# Patient Record
Sex: Male | Born: 1955 | Race: Black or African American | Hispanic: No | Marital: Single | Smoking: Former smoker
Health system: Southern US, Community
[De-identification: ages and names within clinical notes are randomized; demographics above are authoritative.]

## PROBLEM LIST (undated history)

## (undated) DIAGNOSIS — J301 Allergic rhinitis due to pollen: Secondary | ICD-10-CM

## (undated) DIAGNOSIS — K219 Gastro-esophageal reflux disease without esophagitis: Secondary | ICD-10-CM

## (undated) DIAGNOSIS — M48061 Spinal stenosis, lumbar region without neurogenic claudication: Secondary | ICD-10-CM

## (undated) DIAGNOSIS — L209 Atopic dermatitis, unspecified: Secondary | ICD-10-CM

## (undated) DIAGNOSIS — F40240 Claustrophobia: Secondary | ICD-10-CM

## (undated) DIAGNOSIS — K449 Diaphragmatic hernia without obstruction or gangrene: Secondary | ICD-10-CM

## (undated) DIAGNOSIS — N401 Enlarged prostate with lower urinary tract symptoms: Secondary | ICD-10-CM

## (undated) DIAGNOSIS — Z8042 Family history of malignant neoplasm of prostate: Secondary | ICD-10-CM

## (undated) DIAGNOSIS — Z973 Presence of spectacles and contact lenses: Secondary | ICD-10-CM

## (undated) DIAGNOSIS — M5412 Radiculopathy, cervical region: Secondary | ICD-10-CM

## (undated) DIAGNOSIS — E785 Hyperlipidemia, unspecified: Secondary | ICD-10-CM

## (undated) DIAGNOSIS — T63441A Toxic effect of venom of bees, accidental (unintentional), initial encounter: Secondary | ICD-10-CM

## (undated) DIAGNOSIS — Z6829 Body mass index (BMI) 29.0-29.9, adult: Secondary | ICD-10-CM

## (undated) DIAGNOSIS — M5416 Radiculopathy, lumbar region: Secondary | ICD-10-CM

## (undated) DIAGNOSIS — F419 Anxiety disorder, unspecified: Secondary | ICD-10-CM

## (undated) DIAGNOSIS — F5104 Psychophysiologic insomnia: Secondary | ICD-10-CM

## (undated) DIAGNOSIS — E7800 Pure hypercholesterolemia, unspecified: Secondary | ICD-10-CM

## (undated) DIAGNOSIS — J45909 Unspecified asthma, uncomplicated: Secondary | ICD-10-CM

## (undated) DIAGNOSIS — R202 Paresthesia of skin: Secondary | ICD-10-CM

## (undated) DIAGNOSIS — R030 Elevated blood-pressure reading, without diagnosis of hypertension: Secondary | ICD-10-CM

## (undated) DIAGNOSIS — F1290 Cannabis use, unspecified, uncomplicated: Secondary | ICD-10-CM

## (undated) DIAGNOSIS — F32A Depression, unspecified: Secondary | ICD-10-CM

## (undated) DIAGNOSIS — R011 Cardiac murmur, unspecified: Secondary | ICD-10-CM

## (undated) DIAGNOSIS — Z8739 Personal history of other diseases of the musculoskeletal system and connective tissue: Secondary | ICD-10-CM

## (undated) DIAGNOSIS — M109 Gout, unspecified: Secondary | ICD-10-CM

## (undated) DIAGNOSIS — N529 Male erectile dysfunction, unspecified: Secondary | ICD-10-CM

## (undated) DIAGNOSIS — M7711 Lateral epicondylitis, right elbow: Secondary | ICD-10-CM

## (undated) DIAGNOSIS — N451 Epididymitis: Secondary | ICD-10-CM

## (undated) DIAGNOSIS — Z87438 Personal history of other diseases of male genital organs: Secondary | ICD-10-CM

## (undated) DIAGNOSIS — I10 Essential (primary) hypertension: Secondary | ICD-10-CM

## (undated) HISTORY — DX: Lateral epicondylitis, right elbow: M77.11

## (undated) HISTORY — DX: Toxic effect of venom of bees, accidental (unintentional), initial encounter: T63.441A

## (undated) HISTORY — DX: Family history of malignant neoplasm of prostate: Z80.42

## (undated) HISTORY — DX: Pure hypercholesterolemia, unspecified: E78.00

## (undated) HISTORY — DX: Gout, unspecified: M10.9

## (undated) HISTORY — DX: Atopic dermatitis, unspecified: L20.9

## (undated) HISTORY — DX: Anxiety disorder, unspecified: F41.9

## (undated) HISTORY — DX: Unspecified asthma, uncomplicated: J45.909

## (undated) HISTORY — DX: Radiculopathy, cervical region: M54.12

## (undated) HISTORY — PX: UPPER GI ENDOSCOPY: SHX6162

## (undated) HISTORY — DX: Gastro-esophageal reflux disease without esophagitis: K21.9

## (undated) HISTORY — DX: Essential (primary) hypertension: I10

## (undated) HISTORY — DX: Body mass index (BMI) 29.0-29.9, adult: Z68.29

## (undated) HISTORY — DX: Paresthesia of skin: R20.2

## (undated) HISTORY — DX: Allergic rhinitis due to pollen: J30.1

## (undated) HISTORY — DX: Elevated blood-pressure reading, without diagnosis of hypertension: R03.0

## (undated) HISTORY — DX: Psychophysiologic insomnia: F51.04

## (undated) HISTORY — PX: VASECTOMY: SHX75

## (undated) HISTORY — DX: Epididymitis: N45.1

---

## 2001-06-18 ENCOUNTER — Emergency Department (HOSPITAL_COMMUNITY): Admission: EM | Admit: 2001-06-18 | Discharge: 2001-06-18 | Payer: Self-pay | Admitting: Emergency Medicine

## 2001-06-18 ENCOUNTER — Encounter: Payer: Self-pay | Admitting: Emergency Medicine

## 2001-07-13 ENCOUNTER — Encounter: Payer: Self-pay | Admitting: *Deleted

## 2001-07-13 ENCOUNTER — Ambulatory Visit (HOSPITAL_COMMUNITY): Admission: RE | Admit: 2001-07-13 | Discharge: 2001-07-13 | Payer: Self-pay | Admitting: *Deleted

## 2011-01-16 ENCOUNTER — Emergency Department (HOSPITAL_COMMUNITY)
Admission: EM | Admit: 2011-01-16 | Discharge: 2011-01-16 | Disposition: A | Discharge status: Home / Self Care | Payer: 59 | Attending: Emergency Medicine | Admitting: Emergency Medicine

## 2011-01-16 DIAGNOSIS — L299 Pruritus, unspecified: Secondary | ICD-10-CM | POA: Insufficient documentation

## 2011-01-16 DIAGNOSIS — T3995XA Adverse effect of unspecified nonopioid analgesic, antipyretic and antirheumatic, initial encounter: Secondary | ICD-10-CM | POA: Insufficient documentation

## 2011-01-16 DIAGNOSIS — T424X5A Adverse effect of benzodiazepines, initial encounter: Secondary | ICD-10-CM | POA: Insufficient documentation

## 2011-01-16 DIAGNOSIS — R21 Rash and other nonspecific skin eruption: Secondary | ICD-10-CM | POA: Insufficient documentation

## 2011-01-16 DIAGNOSIS — R209 Unspecified disturbances of skin sensation: Secondary | ICD-10-CM | POA: Insufficient documentation

## 2011-01-16 DIAGNOSIS — M129 Arthropathy, unspecified: Secondary | ICD-10-CM | POA: Insufficient documentation

## 2011-01-16 DIAGNOSIS — M25519 Pain in unspecified shoulder: Secondary | ICD-10-CM | POA: Insufficient documentation

## 2011-04-16 ENCOUNTER — Emergency Department (HOSPITAL_COMMUNITY)
Admission: EM | Admit: 2011-04-16 | Discharge: 2011-04-16 | Disposition: A | Discharge status: Home / Self Care | Payer: 59 | Attending: Emergency Medicine | Admitting: Emergency Medicine

## 2011-04-16 DIAGNOSIS — R229 Localized swelling, mass and lump, unspecified: Secondary | ICD-10-CM | POA: Insufficient documentation

## 2011-04-16 DIAGNOSIS — Z91038 Other insect allergy status: Secondary | ICD-10-CM | POA: Insufficient documentation

## 2011-04-16 DIAGNOSIS — L539 Erythematous condition, unspecified: Secondary | ICD-10-CM | POA: Insufficient documentation

## 2011-04-16 DIAGNOSIS — T63461A Toxic effect of venom of wasps, accidental (unintentional), initial encounter: Secondary | ICD-10-CM | POA: Insufficient documentation

## 2011-04-16 DIAGNOSIS — T6391XA Toxic effect of contact with unspecified venomous animal, accidental (unintentional), initial encounter: Secondary | ICD-10-CM | POA: Insufficient documentation

## 2017-10-20 HISTORY — PX: COLONOSCOPY: SHX174

## 2018-08-26 HISTORY — PX: UPPER GI ENDOSCOPY: SHX6162

## 2018-12-09 DIAGNOSIS — F419 Anxiety disorder, unspecified: Secondary | ICD-10-CM | POA: Insufficient documentation

## 2020-03-28 ENCOUNTER — Encounter: Payer: Self-pay | Admitting: Neurology

## 2020-03-29 ENCOUNTER — Other Ambulatory Visit: Payer: Self-pay

## 2020-03-29 DIAGNOSIS — R202 Paresthesia of skin: Secondary | ICD-10-CM

## 2020-03-29 DIAGNOSIS — G5603 Carpal tunnel syndrome, bilateral upper limbs: Secondary | ICD-10-CM

## 2020-03-31 ENCOUNTER — Other Ambulatory Visit (HOSPITAL_COMMUNITY): Payer: Self-pay | Admitting: Family Medicine

## 2020-03-31 DIAGNOSIS — R011 Cardiac murmur, unspecified: Secondary | ICD-10-CM

## 2020-04-03 ENCOUNTER — Other Ambulatory Visit (HOSPITAL_COMMUNITY): Payer: Self-pay

## 2020-04-05 ENCOUNTER — Other Ambulatory Visit: Payer: Self-pay

## 2020-04-05 ENCOUNTER — Ambulatory Visit (HOSPITAL_COMMUNITY): Payer: 59 | Attending: Cardiology

## 2020-04-05 DIAGNOSIS — R011 Cardiac murmur, unspecified: Secondary | ICD-10-CM

## 2020-04-05 LAB — ECHOCARDIOGRAM COMPLETE
Area-P 1/2: 3.81 cm2
S' Lateral: 2.7 cm

## 2020-04-25 ENCOUNTER — Ambulatory Visit (INDEPENDENT_AMBULATORY_CARE_PROVIDER_SITE_OTHER): Payer: 59 | Admitting: Neurology

## 2020-04-25 ENCOUNTER — Other Ambulatory Visit: Payer: Self-pay

## 2020-04-25 DIAGNOSIS — G5603 Carpal tunnel syndrome, bilateral upper limbs: Secondary | ICD-10-CM

## 2020-04-25 DIAGNOSIS — R202 Paresthesia of skin: Secondary | ICD-10-CM

## 2020-04-25 NOTE — Procedures (Signed)
[facility] Neurology  [address] [place], [address]  [place], [place] [address] Tel: [phone] Fax:  [phone] Test Date:  04/25/2020  Patient: David Graves DOB: 04/03/1956 Physician: Nita Sickle, DO  Sex: Male Height: 5\' 7"  Ref Phys: , MD  ID#: Mila Palmer Temp: 33.0C Technician:    Patient Complaints: This is a 64 year old man referred for evaluation of bilateral hand paresthesias and cramps.  NCV & EMG Findings: Extensive electrodiagnostic testing of the right upper extremity and additional studies of the left shows: 1. Bilateral mixed palmar sensory responses show prolonged latency.  Bilateral median and ulnar sensory responses are within normal limits. 2. Bilateral median and ulnar motor responses are within normal limits. 3. There is no evidence of active or chronic motor axonal loss changes affecting any of the tested muscles.  Motor unit configuration and recruitment pattern is within normal limits.  Impression: Bilateral median neuropathy at or distal to the wrist, consistent with a clinical diagnosis of carpal tunnel syndrome.  Overall, these findings are very mild in degree electrically.   ___________________________ 70, DO    Nerve Conduction Studies Anti Sensory Summary Table   Stim Site NR Peak (ms) Norm Peak (ms) P-T Amp (V) Norm P-T Amp  Left Median Anti Sensory (2nd Digit)  33C  Wrist    3.6 <3.8 21.6 >10  Right Median Anti Sensory (2nd Digit)  33C  Wrist    3.3 <3.8 20.5 >10  Left Ulnar Anti Sensory (5th Digit)  33C  Wrist    2.8 <3.2 21.4 >5  Right Ulnar Anti Sensory (5th Digit)  33C  Wrist    2.8 <3.2 19.7 >5   Motor Summary Table   Stim Site NR Onset (ms) Norm Onset (ms) O-P Amp (mV) Norm O-P Amp Site1 Site2 Delta-0 (ms) Dist (cm) Vel (m/s) Norm Vel (m/s)  Left Median Motor (Abd Poll Brev)  33C  Wrist    3.8 <4.0 8.0 >5 Elbow Wrist 5.6 28.0 50 >50  Elbow    9.4  7.5         Right Median Motor (Abd Poll Brev)   33C  Wrist    3.7 <4.0 8.3 >5 Elbow Wrist 5.4 28.0 52 >50  Elbow    9.1  8.4         Left Ulnar Motor (Abd Dig Minimi)  33C  Wrist    2.7 <3.1 8.2 >7 B Elbow Wrist 4.1 25.0 61 >50  B Elbow    6.8  7.2  A Elbow B Elbow 1.6 10.0 62 >50  A Elbow    8.4  7.0         Right Ulnar Motor (Abd Dig Minimi)  33C  Wrist    2.4 <3.1 9.3 >7 B Elbow Wrist 4.2 24.0 57 >50  B Elbow    6.6  8.6  A Elbow B Elbow 1.8 10.0 56 >50  A Elbow    8.4  7.9          Comparison Summary Table   Stim Site NR Peak (ms) Norm Peak (ms) P-T Amp (V) Site1 Site2 Delta-P (ms) Norm Delta (ms)  Left Median/Ulnar Palm Comparison (Wrist - 8cm)  33C  Median Palm    2.3 <2.2 16.8 Median Palm Ulnar Palm 0.8   Ulnar Palm    1.5 <2.2 12.5      Right Median/Ulnar Palm Comparison (Wrist - 8cm)  33C  Median Palm    2.1 <2.2 27.0 Median Nita Sickle  0.4   Ulnar Palm    1.7 <2.2 8.3       EMG   Side Muscle Ins Act Fibs Psw Fasc Number Recrt Dur Dur. Amp Amp. Poly Poly. Comment  Right 1stDorInt Nml Nml Nml Nml Nml Nml Nml Nml Nml Nml Nml Nml N/A  Right PronatorTeres Nml Nml Nml Nml Nml Nml Nml Nml Nml Nml Nml Nml N/A  Right Biceps Nml Nml Nml Nml Nml Nml Nml Nml Nml Nml Nml Nml N/A  Right Triceps Nml Nml Nml Nml Nml Nml Nml Nml Nml Nml Nml Nml N/A  Right Deltoid Nml Nml Nml Nml Nml Nml Nml Nml Nml Nml Nml Nml N/A  Left 1stDorInt Nml Nml Nml Nml Nml Nml Nml Nml Nml Nml Nml Nml N/A  Left Abd Poll Brev Nml Nml Nml Nml Nml Nml Nml Nml Nml Nml Nml Nml N/A  Left PronatorTeres Nml Nml Nml Nml Nml Nml Nml Nml Nml Nml Nml Nml N/A  Right Abd Poll Brev Nml Nml Nml Nml Nml Nml Nml Nml Nml Nml Nml Nml N/A      Waveforms:

## 2020-05-17 ENCOUNTER — Encounter: Payer: Self-pay | Admitting: Cardiology

## 2020-05-17 ENCOUNTER — Ambulatory Visit (INDEPENDENT_AMBULATORY_CARE_PROVIDER_SITE_OTHER): Payer: 59 | Admitting: Cardiology

## 2020-05-17 ENCOUNTER — Other Ambulatory Visit: Payer: Self-pay

## 2020-05-17 VITALS — BP 114/70 | HR 62 | Ht 67.0 in | Wt 167.0 lb

## 2020-05-17 DIAGNOSIS — I10 Essential (primary) hypertension: Secondary | ICD-10-CM | POA: Diagnosis not present

## 2020-05-17 DIAGNOSIS — Z01812 Encounter for preprocedural laboratory examination: Secondary | ICD-10-CM

## 2020-05-17 DIAGNOSIS — E7800 Pure hypercholesterolemia, unspecified: Secondary | ICD-10-CM | POA: Diagnosis not present

## 2020-05-17 DIAGNOSIS — R079 Chest pain, unspecified: Secondary | ICD-10-CM

## 2020-05-17 DIAGNOSIS — R0789 Other chest pain: Secondary | ICD-10-CM | POA: Diagnosis not present

## 2020-05-17 MED ORDER — METOPROLOL TARTRATE 50 MG PO TABS: 50.0000 mg | ORAL_TABLET | Freq: Once | ORAL | 0 refills | Status: DC

## 2020-05-17 NOTE — Progress Notes (Signed)
Cardiology Office Note:    Date:  05/17/2020   ID:  David Graves, DOB [birth year], MRN [id number]  PCP:  Jonathon Jordan, MD  [facility] Cardiologist:  Candee Furbish, MD  [facility] Electrophysiologist:  None   Referring MD: Jonathon Jordan, MD     History of Present Illness:    David Graves is a 64 y.o. male here for the evaluation of chest pain at the request of Dr. Stephanie Acre.  Has been experiencing chest burning on the left side. Burning 4/10. Non exertional. No change after food. Feels cramps in both arms as well while on computer.  Smokes THC.  Has had EGD work-up.  Unremarkable.  Brother in his 59s diagnosed with prostate cancer.  Quit cigarettes in 2013.  Works as a Social worker.   Past Medical History:  Diagnosis Date  . Anxiety   . Asthma   . Atopic dermatitis   . Bee sting   . BMI 29.0-29.9,adult   . Cervical radiculopathy   . Chronic insomnia   . Complaint of paresthesia   . Elevated blood pressure reading   . Epididymitis   . Family history of prostate cancer   . GERD (gastroesophageal reflux disease)   . Gout   . Hypertension   . Lateral epicondylitis of right elbow   . Pure hypercholesterolemia   . Seasonal allergic rhinitis due to pollen       Current Medications: Current Meds  Medication Sig  . amLODipine (NORVASC) 10 MG tablet Take 10 mg by mouth daily.  Marland Kitchen aspirin EC 81 MG tablet Take 81 mg by mouth daily. Swallow whole.  Marland Kitchen atorvastatin (LIPITOR) 40 MG tablet Take 40 mg by mouth daily.  . Azelastine & Fluticasone 137 & 50 MCG/ACT THPK Place into the nose.  Marland Kitchen EPINEPHrine (EPIPEN 2-PAK) 0.3 mg/0.3 mL IJ SOAJ injection Inject 0.3 mg into the muscle as needed for anaphylaxis.  . hydrOXYzine (ATARAX/VISTARIL) 25 MG tablet Take 25 mg by mouth 3 (three) times daily as needed.     Allergies:   Codeine, Colchicine, Hydrocodone-acetaminophen, Sulfa antibiotics, and Penicillins   Social History   Socioeconomic History  . Marital status:  Married    Spouse name: Not on file  . Number of children: Not on file  . Years of education: Not on file  . Highest education level: Not on file  Occupational History  . Not on file  Tobacco Use  . Smoking status: Former Smoker    Quit date: 05/16/2012    Years since quitting: 8.0  . Smokeless tobacco: Never Used  Substance and Sexual Activity  . Alcohol use: Never  . Drug use: Never  . Sexual activity: Not on file  Other Topics Concern  . Not on file  Social History Narrative  . Not on file   Social Determinants of Health   Financial Resource Strain:   . Difficulty of Paying Living Expenses: Not on file  Food Insecurity:   . Worried About Charity fundraiser in the Last Year: Not on file  . Ran Out of Food in the Last Year: Not on file  Transportation Needs:   . Lack of Transportation (Medical): Not on file  . Lack of Transportation (Non-Medical): Not on file  Physical Activity:   . Days of Exercise per Week: Not on file  . Minutes of Exercise per Session: Not on file  Stress:   . Feeling of Stress : Not on file  Social Connections:   . Frequency of Communication  with Friends and Family: Not on file  . Frequency of Social Gatherings with Friends and Family: Not on file  . Attends Religious Services: Not on file  . Active Member of Clubs or Organizations: Not on file  . Attends Archivist Meetings: Not on file  . Marital Status: Not on file     Family History: No early family history of coronary artery disease  ROS:   Please see the history of present illness.     All other systems reviewed and are negative.  EKGs/Labs/Other Studies Reviewed:    The following studies were reviewed today: Former medical records noted  EKG:  EKG is  ordered today.  The ekg ordered today demonstrates sinus rhythm heart rate 60, borderline LVH.  Recent Labs: No results found for requested labs within last 8760 hours.  Recent Lipid Panel No results found for: CHOL,  TRIG, HDL, CHOLHDL, VLDL, LDLCALC, LDLDIRECT   Hemoglobin 15.0 creatinine 0.89 potassium 3.6 ALT 18 TSH 0.58 LDL 86 HDL 49  Physical Exam:    VS:  BP 114/70   Pulse 62   Ht '5\' 7"'  (1.702 m)   Wt 167 lb (75.8 kg)   SpO2 97%   BMI 26.16 kg/m     Wt Readings from Last 3 Encounters:  05/17/20 167 lb (75.8 kg)     GEN:  Well nourished, well developed in no acute distress HEENT: Normal NECK: No JVD; No carotid bruits LYMPHATICS: No lymphadenopathy CARDIAC: RRR, no murmurs, rubs, gallops RESPIRATORY:  Clear to auscultation without rales, wheezing or rhonchi  ABDOMEN: Soft, non-tender, non-distended MUSCULOSKELETAL:  No edema; No deformity  SKIN: Warm and dry NEUROLOGIC:  Alert and oriented x 3 PSYCHIATRIC:  Normal affect   ASSESSMENT:    1. Pure hypercholesterolemia   2. Chest pain, unspecified type   3. Essential hypertension   4. Other chest pain   5. Pre-procedure lab exam    PLAN:    In order of problems listed above:  Chest discomfort -Smoker, hypertension, hyperlipidemia.  We will proceed with coronary CT scan with possible FFR analysis. --EGD done- negative.  Has been treated empirically for GERD.  No change in his current symptoms.  Hyperlipidemia -On atorvastatin 20 mg a day.  LDL 86 as above.  If there is any evidence of coronary plaque, recommend increasing the atorvastatin to 40 mg a day for high intensity dose and to push the LDL less than 70.  Essential hypertension -On amlodipine, good control.   Medication Adjustments/Labs and Tests Ordered: Current medicines are reviewed at length with the patient today.  Concerns regarding medicines are outlined above.  Orders Placed This Encounter  Procedures  . CT CORONARY MORPH W/CTA COR W/SCORE W/CA W/CM &/OR WO/CM  . CT CORONARY FRACTIONAL FLOW RESERVE DATA PREP  . CT CORONARY FRACTIONAL FLOW RESERVE FLUID ANALYSIS  . Basic metabolic panel   Meds ordered this encounter  Medications  . metoprolol tartrate  (LOPRESSOR) 50 MG tablet    Sig: Take 1 tablet (50 mg total) by mouth once for 1 dose. Take 1 tablet 2 hours before your coronary CT scan.    Dispense:  1 tablet    Refill:  0    Patient Instructions  Medication Instructions:  The current medical regimen is effective;  continue present plan and medications.  *If you need a refill on your cardiac medications before your next appointment, please call your pharmacy*   Lab Work: Will need before your Coronary CT scan. (BMP)  If you have labs (blood work) drawn today and your tests are completely normal, you will receive your results only by: Marland Kitchen MyChart Message (if you have MyChart) OR . A paper copy in the mail If you have any lab test that is abnormal or we need to change your treatment, we will call you to review the results.   Testing/Procedures: Your cardiac CT will be scheduled at:   [facility] [address] [place], [place] [address] [phone]  Please arrive at the [facility] main entrance of [facility] 30 minutes prior to test start time. Proceed to the [facility] Radiology Department (first floor) to check-in and test prep.  Hold all erectile dysfunction medications at least 3 days (72 hrs) prior to test.  On the Night Before the Test: . Be sure to Drink plenty of water. . Do not consume any caffeinated/decaffeinated beverages or chocolate 12 hours prior to your test. . Do not take any antihistamines 12 hours prior to your test.  On the Day of the Test: . Drink plenty of water. Do not drink any water within one hour of the test. . Do not eat any food 4 hours prior to the test. . You may take your regular medications prior to the test.  . Take metoprolol (Lopressor) two hours prior to test. . HOLD Furosemide/Hydrochlorothiazide morning of the test   After the Test: . Drink plenty of water. . After receiving IV contrast, you may experience a mild flushed feeling. This is normal. . On  occasion, you may experience a mild rash up to 24 hours after the test. This is not dangerous. If this occurs, you can take Benadryl 25 mg and increase your fluid intake. . If you experience trouble breathing, this can be serious. If it is severe call 911 IMMEDIATELY. If it is mild, please call our office. . If you take any of these medications: Glipizide/Metformin, Avandament, Glucavance, please do not take 48 hours after completing test unless otherwise instructed.   Once we have confirmed authorization from your insurance company, we will call you to set up a date and time for your test. Based on how quickly your insurance processes prior authorizations requests, please allow up to 4 weeks to be contacted for scheduling your Cardiac CT appointment. Be advised that routine Cardiac CT appointments could be scheduled as many as 8 weeks after your provider has ordered it.  For non-scheduling related questions, please contact the cardiac imaging nurse navigator should you have any questions/concerns: Marchia Bond, Cardiac Imaging Nurse Navigator Burley Saver, Interim Cardiac Imaging Nurse [place] [facility] Direct Office Dial: [phone]   For scheduling needs, including cancellations and rescheduling, please call Vivien Rota at [phone], option 3.    Follow-Up: At [facility], you and your health needs are our priority.  As part of our continuing mission to provide you with exceptional heart care, we have created designated Provider Care Teams.  These Care Teams include your primary Cardiologist (physician) and Advanced Practice Providers (APPs -  Physician Assistants and Nurse Practitioners) who all work together to provide you with the care you need, when you need it.  We recommend signing up for the patient portal called "MyChart".  Sign up information is provided on this After Visit Summary.  MyChart is used to connect with patients for Virtual Visits  (Telemedicine).  Patients are able to view lab/test results, encounter notes, upcoming appointments, etc.  Non-urgent messages can be sent  to your provider as well.   To learn more about what you can do with MyChart, go to [email or web address].      Thank you for choosing [facility]!!         Signed, Candee Furbish, MD  05/17/2020 3:09 PM    [place]

## 2020-05-17 NOTE — Patient Instructions (Signed)
Medication Instructions:  The current medical regimen is effective;  continue present plan and medications.  *If you need a refill on your cardiac medications before your next appointment, please call your pharmacy*   Lab Work: Will need before your Coronary CT scan. (BMP) If you have labs (blood work) drawn today and your tests are completely normal, you will receive your results only by: Marland Kitchen MyChart Message (if you have MyChart) OR . A paper copy in the mail If you have any lab test that is abnormal or we need to change your treatment, we will call you to review the results.   Testing/Procedures: Your cardiac CT will be scheduled at:   [facility] [address] [place], [place] [address] [phone]  Please arrive at the [facility] main entrance of [facility] 30 minutes prior to test start time. Proceed to the [facility] Radiology Department (first floor) to check-in and test prep.  Hold all erectile dysfunction medications at least 3 days (72 hrs) prior to test.  On the Night Before the Test: . Be sure to Drink plenty of water. . Do not consume any caffeinated/decaffeinated beverages or chocolate 12 hours prior to your test. . Do not take any antihistamines 12 hours prior to your test.  On the Day of the Test: . Drink plenty of water. Do not drink any water within one hour of the test. . Do not eat any food 4 hours prior to the test. . You may take your regular medications prior to the test.  . Take metoprolol (Lopressor) two hours prior to test. . HOLD Furosemide/Hydrochlorothiazide morning of the test   After the Test: . Drink plenty of water. . After receiving IV contrast, you may experience a mild flushed feeling. This is normal. . On occasion, you may experience a mild rash up to 24 hours after the test. This is not dangerous. If this occurs, you can take Benadryl 25 mg and increase your fluid intake. . If you experience trouble breathing,  this can be serious. If it is severe call 911 IMMEDIATELY. If it is mild, please call our office. . If you take any of these medications: Glipizide/Metformin, Avandament, Glucavance, please do not take 48 hours after completing test unless otherwise instructed.   Once we have confirmed authorization from your insurance company, we will call you to set up a date and time for your test. Based on how quickly your insurance processes prior authorizations requests, please allow up to 4 weeks to be contacted for scheduling your Cardiac CT appointment. Be advised that routine Cardiac CT appointments could be scheduled as many as 8 weeks after your provider has ordered it.  For non-scheduling related questions, please contact the cardiac imaging nurse navigator should you have any questions/concerns: Marchia Bond, Cardiac Imaging Nurse Navigator Burley Saver, Interim Cardiac Imaging Nurse [place] [facility] Services Direct Office Dial: [phone]   For scheduling needs, including cancellations and rescheduling, please call Vivien Rota at [phone], option 3.    Follow-Up: At [facility], you and your health needs are our priority.  As part of our continuing mission to provide you with exceptional heart care, we have created designated Provider Care Teams.  These Care Teams include your primary Cardiologist (physician) and Advanced Practice Providers (APPs -  Physician Assistants and Nurse Practitioners) who all work together to provide you with the care you need, when you need it.  We recommend signing up for the patient portal  called "MyChart".  Sign up information is provided on this After Visit Summary.  MyChart is used to connect with patients for Virtual Visits (Telemedicine).  Patients are able to view lab/test results, encounter notes, upcoming appointments, etc.  Non-urgent messages can be sent to your provider as well.   To learn more about what you can do with MyChart, go to  [email or web address].      Thank you for choosing [facility]!!

## 2020-06-06 ENCOUNTER — Telehealth: Payer: Self-pay | Admitting: Cardiology

## 2020-06-06 DIAGNOSIS — R072 Precordial pain: Secondary | ICD-10-CM

## 2020-06-06 NOTE — Telephone Encounter (Signed)
Pt had been ordered to have a Coronary CT.  He is calling b/c his insurance denied overage for this procedure.  He would like to know what his next options are.  Will have Dr Anne Fu review and call pt back.

## 2020-06-06 NOTE — Telephone Encounter (Signed)
New Message:     Pt says his insurance denied his CT. He wants to know what is the next step please?

## 2020-06-06 NOTE — Telephone Encounter (Signed)
Thanks for update Cancel CT scan Let's order a Lexiscan NUC stress Thanks  Donato Schultz, MD

## 2020-06-07 ENCOUNTER — Encounter: Payer: Self-pay | Admitting: *Deleted

## 2020-06-07 NOTE — Telephone Encounter (Signed)
lexiscan ordered as instructed.  Letter of instructions completed.  Pt to be called to be scheduled.

## 2020-06-07 NOTE — Telephone Encounter (Signed)
Pt aware Eugenie Birks has been ordered and he will be called to be scheduled.  Instructions for the testing has been reviewed with patient who states understanding.

## 2020-06-08 ENCOUNTER — Telehealth (HOSPITAL_COMMUNITY): Payer: Self-pay

## 2020-06-08 NOTE — Telephone Encounter (Signed)
Attempted to contact the patient, the line was busy x 2. S.Wiilliams EMTP

## 2020-06-13 ENCOUNTER — Ambulatory Visit (HOSPITAL_COMMUNITY): Payer: 59

## 2020-06-13 ENCOUNTER — Other Ambulatory Visit: Payer: Self-pay

## 2020-06-13 ENCOUNTER — Encounter (HOSPITAL_COMMUNITY): Payer: Self-pay

## 2020-06-15 ENCOUNTER — Telehealth: Payer: Self-pay | Admitting: Cardiology

## 2020-06-15 DIAGNOSIS — I10 Essential (primary) hypertension: Secondary | ICD-10-CM

## 2020-06-15 DIAGNOSIS — E7800 Pure hypercholesterolemia, unspecified: Secondary | ICD-10-CM

## 2020-06-15 DIAGNOSIS — R079 Chest pain, unspecified: Secondary | ICD-10-CM

## 2020-06-15 NOTE — Telephone Encounter (Signed)
Spoke with pt who was to have had a lexiscan myoview.  Pt reports he is too claustrophobic to have the imaging.  Pt is asking if he can have a regular treadmill test instead.  Advised I will review with Dr Anne Fu and call her back.

## 2020-06-15 NOTE — Telephone Encounter (Signed)
New Message:      Pt needs to talk to Dr Caro Hight or his nurse about his Myocardial Test. He wants to know if he can just to the physical part and not the imaging.

## 2020-06-16 ENCOUNTER — Other Ambulatory Visit: Payer: Self-pay

## 2020-06-16 ENCOUNTER — Encounter: Payer: Self-pay | Admitting: Orthopaedic Surgery

## 2020-06-16 ENCOUNTER — Ambulatory Visit (INDEPENDENT_AMBULATORY_CARE_PROVIDER_SITE_OTHER): Payer: 59 | Admitting: Orthopaedic Surgery

## 2020-06-16 DIAGNOSIS — G5601 Carpal tunnel syndrome, right upper limb: Secondary | ICD-10-CM | POA: Diagnosis not present

## 2020-06-16 DIAGNOSIS — G5602 Carpal tunnel syndrome, left upper limb: Secondary | ICD-10-CM

## 2020-06-16 MED ORDER — LIDOCAINE HCL 1 % IJ SOLN
1.0000 mL | INTRAMUSCULAR | Status: AC | PRN
  Administered 2020-06-16: 1 mL

## 2020-06-16 MED ORDER — METHYLPREDNISOLONE ACETATE 40 MG/ML IJ SUSP
40.0000 mg | INTRAMUSCULAR | Status: AC | PRN
  Administered 2020-06-16: 40 mg

## 2020-06-16 MED ORDER — BUPIVACAINE HCL 0.5 % IJ SOLN
1.0000 mL | INTRAMUSCULAR | Status: AC | PRN
  Administered 2020-06-16: 1 mL

## 2020-06-16 NOTE — Progress Notes (Signed)
Office Visit Note   Patient: David Graves           Date of Birth: [birth year]           MRN: [id number] Visit Date: 06/16/2020              Requested by: David Palmer, MD [address] [place],  [place] [address] PCP: David Palmer, MD   Assessment & Plan: Visit Diagnoses:  1. Right carpal tunnel syndrome   2. Left carpal tunnel syndrome     Plan: Impression is left greater than right mild carpal tunnel syndrome mainly clinical.  We discussed treatment options of continue bracing, prednisone, cortisone injection, surgical release.  I have recommended trying a cortisone injection today which he is in agreement with.  He tolerated these injections well.  We will also obtain arthritis panel to rule out autoimmune disorders.  Patient instructed to follow-up if he does not notice any improvement in symptoms.  Follow-Up Instructions: Return if symptoms worsen or fail to improve.   Orders:  No orders of the defined types were placed in this encounter.  No orders of the defined types were placed in this encounter.     Procedures: Hand/UE Inj: bilateral carpal tunnel for carpal tunnel syndrome on 06/16/2020 9:00 AM Indications: pain Details: 25 G needle Medications (Right): 1 mL lidocaine 1 %; 1 mL bupivacaine 0.5 %; 40 mg methylPREDNISolone acetate 40 MG/ML Medications (Left): 1 mL lidocaine 1 %; 1 mL bupivacaine 0.5 %; 40 mg methylPREDNISolone acetate 40 MG/ML Outcome: tolerated well, no immediate complications Patient was prepped and draped in the usual sterile fashion.       Clinical Data: No additional findings.   Subjective: Chief Complaint  Patient presents with  . Right Hand - Pain  . Left Hand - Pain    David Graves is a very pleasant 64 year old gentleman comes in as a referral from neurologist and PCP for clinical carpal tunnel syndrome bilaterally worse on the left.  He had nerve conduction studies done at the end of August which showed  mild carpal tunnel syndrome.  He has had pain for months it as well as numbness tingling and burning.  He works as a Veterinary surgeon.  He has a lot of pain throughout his arms.  He has tried some oral medication but not sure what he took.   Review of Systems  Constitutional: Negative.   All other systems reviewed and are negative.    Objective: Vital Signs: There were no vitals taken for this visit.  Physical Exam Vitals and nursing note reviewed.  Constitutional:      Appearance: He is well-developed.  HENT:     Head: Normocephalic and atraumatic.  Eyes:     Pupils: Pupils are equal, round, and reactive to light.  Pulmonary:     Effort: Pulmonary effort is normal.  Abdominal:     Palpations: Abdomen is soft.  Musculoskeletal:        General: Normal range of motion.     Cervical back: Neck supple.  Skin:    General: Skin is warm.  Neurological:     Mental Status: He is alert and oriented to person, place, and time.  Psychiatric:        Behavior: Behavior normal.        Thought Content: Thought content normal.        Judgment: Judgment normal.     Ortho Exam Bilateral hands show normal appearance without any muscle atrophy.  Positive Durkan's, positive Phalen's, positive Tinel at the carpal tunnel worse on the left.  Specialty Comments:  No specialty comments available.  Imaging: No results found.   PMFS History: There are no problems to display for this patient.  Past Medical History:  Diagnosis Date  . Anxiety   . Asthma   . Atopic dermatitis   . Bee sting   . BMI 29.0-29.9,adult   . Cervical radiculopathy   . Chronic insomnia   . Complaint of paresthesia   . Elevated blood pressure reading   . Epididymitis   . Family history of prostate cancer   . GERD (gastroesophageal reflux disease)   . Gout   . Hypertension   . Lateral epicondylitis of right elbow   . Pure hypercholesterolemia   . Seasonal allergic rhinitis due to pollen     History reviewed. No  pertinent family history.  History reviewed. No pertinent surgical history. Social History   Occupational History  . Not on file  Tobacco Use  . Smoking status: Former Smoker    Quit date: 05/16/2012    Years since quitting: 8.0  . Smokeless tobacco: Never Used  Substance and Sexual Activity  . Alcohol use: Never  . Drug use: Never  . Sexual activity: Not on file

## 2020-06-16 NOTE — Telephone Encounter (Signed)
Spoke with pt who is aware pr Dr Anne Fu GXT has been ordered.  Reviewed instructions with him.  Also advised he will have to been Covid tested and self quarantine at home until the time of the GXT test.  He tests understanding and is aware he will be called to be scheduled.

## 2020-06-16 NOTE — Telephone Encounter (Signed)
Okay to switch to exercise treadmill test only secondary to claustrophobia. Donato Schultz, MD

## 2020-06-19 ENCOUNTER — Other Ambulatory Visit: Payer: Self-pay

## 2020-06-19 DIAGNOSIS — R768 Other specified abnormal immunological findings in serum: Secondary | ICD-10-CM

## 2020-06-19 DIAGNOSIS — R7689 Other specified abnormal immunological findings in serum: Secondary | ICD-10-CM

## 2020-06-19 LAB — SEDIMENTATION RATE: Sed Rate: 2 mm/h (ref 0–20)

## 2020-06-19 LAB — ANA: Anti Nuclear Antibody (ANA): NEGATIVE

## 2020-06-19 LAB — URIC ACID: Uric Acid, Serum: 6.3 mg/dL (ref 4.0–8.0)

## 2020-06-19 LAB — RHEUMATOID FACTOR: Rheumatoid fact SerPl-aCnc: 16 IU/mL — ABNORMAL HIGH (ref <14)

## 2020-06-19 NOTE — Progress Notes (Signed)
Please refer to rheumatology.  Thanks.

## 2020-06-27 ENCOUNTER — Ambulatory Visit (INDEPENDENT_AMBULATORY_CARE_PROVIDER_SITE_OTHER): Payer: 59 | Admitting: Orthopaedic Surgery

## 2020-06-27 ENCOUNTER — Encounter: Payer: Self-pay | Admitting: Orthopaedic Surgery

## 2020-06-27 DIAGNOSIS — R202 Paresthesia of skin: Secondary | ICD-10-CM

## 2020-06-27 DIAGNOSIS — R52 Pain, unspecified: Secondary | ICD-10-CM | POA: Diagnosis not present

## 2020-06-27 NOTE — Progress Notes (Signed)
Office Visit Note   Patient: David Graves           Date of Birth: 1955-12-21           MRN: [id number] Visit Date: 06/27/2020              Requested by: Mila Palmer, MD [address] [place],  [place] [address] PCP: Mila Palmer, MD   Assessment & Plan: Visit Diagnoses:  1. Complaints of total body pain   2. Paresthesias in left hand   3. Paresthesias in right hand     Plan: Impression is total body pain with bilateral hand paresthesias.  Due to the patient's symptoms as well as recent elevated rheumatoid factor, we would like for rheumatology to see him before further work-up for specific areas of concern.  He will follow up with [place] as needed.  Follow-Up Instructions: Return if symptoms worsen or fail to improve.   Orders:  No orders of the defined types were placed in this encounter.  No orders of the defined types were placed in this encounter.     Procedures: No procedures performed   Clinical Data: No additional findings.   Subjective: Chief Complaint  Patient presents with  . Right Hand - Pain, Follow-up  . Left Hand - Pain, Follow-up    HPI patient is a pleasant 64 year old gentleman who comes in today with continued bilateral hand pain and paresthesias as well as complaints of total body pain, specifically in his neck, both arms, back and both legs.  In regards to his hands, he had a nerve conduction study back in August which showed mild carpal tunnel syndrome that was mostly clinical in nature.  Bilateral carpal tunnel injections were performed recently which provided no relief of symptoms even during the anesthetic phase.  He continues to have paresthesias to all 5 fingers left greater than right.  He states that he has been seen at [facility] orth[facility]ics in the past for his neck and back where he has been told he has arthritis.  He denies any paresthesias to either leg.  He takes Tylenol as needed.  He notes a very remote history of  epidural steroid injection to his lumbar spine years ago which did not provide any relief.  Rheumatoid panel recently drawn by US showed elevation of rheumatoid factor.  He has been referred to rheumatology for which he has his first appointment tomorrow.  Review of Systems as detailed in HPI.  All others reviewed and are negative.   Objective: Vital Signs: There were no vitals taken for this visit.  Physical Exam well-developed well-nourished gentleman in no acute distress.  Alert and oriented x3.  Ortho Exam stable bilateral hand, neck and back exams  Specialty Comments:  No specialty comments available.  Imaging: No new imaging   PMFS History: There are no problems to display for this patient.  Past Medical History:  Diagnosis Date  . Anxiety   . Asthma   . Atopic dermatitis   . Bee sting   . BMI 29.0-29.9,adult   . Cervical radiculopathy   . Chronic insomnia   . Complaint of paresthesia   . Elevated blood pressure reading   . Epididymitis   . Family history of prostate cancer   . GERD (gastroesophageal reflux disease)   . Gout   . Hypertension   . Lateral epicondylitis of right elbow   . Pure hypercholesterolemia   . Seasonal allergic rhinitis due to pollen  History reviewed. No pertinent family history.  History reviewed. No pertinent surgical history. Social History   Occupational History  . Not on file  Tobacco Use  . Smoking status: Former Smoker    Quit date: 05/16/2012    Years since quitting: 8.1  . Smokeless tobacco: Never Used  Substance and Sexual Activity  . Alcohol use: Never  . Drug use: Never  . Sexual activity: Not on file

## 2020-06-28 ENCOUNTER — Encounter: Payer: Self-pay | Admitting: Internal Medicine

## 2020-06-28 ENCOUNTER — Ambulatory Visit (INDEPENDENT_AMBULATORY_CARE_PROVIDER_SITE_OTHER): Payer: 59 | Admitting: Internal Medicine

## 2020-06-28 ENCOUNTER — Ambulatory Visit: Payer: Self-pay

## 2020-06-28 ENCOUNTER — Other Ambulatory Visit: Payer: Self-pay

## 2020-06-28 VITALS — BP 153/80 | HR 61 | Ht 67.0 in | Wt 170.2 lb

## 2020-06-28 DIAGNOSIS — M79641 Pain in right hand: Secondary | ICD-10-CM | POA: Diagnosis not present

## 2020-06-28 DIAGNOSIS — I10 Essential (primary) hypertension: Secondary | ICD-10-CM | POA: Insufficient documentation

## 2020-06-28 DIAGNOSIS — R768 Other specified abnormal immunological findings in serum: Secondary | ICD-10-CM | POA: Diagnosis not present

## 2020-06-28 DIAGNOSIS — M79642 Pain in left hand: Secondary | ICD-10-CM | POA: Diagnosis not present

## 2020-06-28 DIAGNOSIS — K219 Gastro-esophageal reflux disease without esophagitis: Secondary | ICD-10-CM | POA: Insufficient documentation

## 2020-06-28 DIAGNOSIS — E785 Hyperlipidemia, unspecified: Secondary | ICD-10-CM | POA: Insufficient documentation
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
## 2020-06-28 NOTE — Patient Instructions (Signed)

## 2020-07-04 ENCOUNTER — Ambulatory Visit (INDEPENDENT_AMBULATORY_CARE_PROVIDER_SITE_OTHER): Payer: 59 | Admitting: Orthopaedic Surgery

## 2020-07-04 ENCOUNTER — Ambulatory Visit (INDEPENDENT_AMBULATORY_CARE_PROVIDER_SITE_OTHER): Payer: 59

## 2020-07-04 ENCOUNTER — Encounter: Payer: Self-pay | Admitting: Orthopaedic Surgery

## 2020-07-04 ENCOUNTER — Other Ambulatory Visit: Payer: Self-pay

## 2020-07-04 DIAGNOSIS — M542 Cervicalgia: Secondary | ICD-10-CM

## 2020-07-04 MED ORDER — PREDNISONE 10 MG (21) PO TBPK: ORAL_TABLET | ORAL | 0 refills | Status: DC

## 2020-07-04 MED ORDER — METHOCARBAMOL 500 MG PO TABS: 500.0000 mg | ORAL_TABLET | Freq: Two times a day (BID) | ORAL | 0 refills | Status: DC | PRN

## 2020-07-04 NOTE — Progress Notes (Signed)
Office Visit Note   Patient: David Graves           Date of Birth: July 10, 1956           MRN: [id number] Visit Date: 07/04/2020              Requested by: Mila Palmer, MD [address] [place],  [place] [address] PCP: Mila Palmer, MD   Assessment & Plan: Visit Diagnoses:  1. Neck pain     Plan: Impression is chronic neck pain with bilateral upper extremity radiculopathy.  I started the patient on a steroid taper and muscle relaxer.  Have also sent in an internal referral for physical therapy.  He will follow up with [place] in the next 6 weeks if his symptoms have not improved.  Call with concerns or questions in meantime.  Follow-Up Instructions: Return if symptoms worsen or fail to improve.   Orders:  Orders Placed This Encounter  Procedures  . XR Cervical Spine 2 or 3 views  . Ambulatory referral to Physical Therapy   Meds ordered this encounter  Medications  . predniSONE (STERAPRED UNI-PAK 21 TAB) 10 MG (21) TBPK tablet    Sig: Take as directed    Dispense:  21 tablet    Refill:  0  . methocarbamol (ROBAXIN) 500 MG tablet    Sig: Take 1 tablet (500 mg total) by mouth 2 (two) times daily as needed.    Dispense:  20 tablet    Refill:  0      Procedures: No procedures performed   Clinical Data: No additional findings.   Subjective: Chief Complaint  Patient presents with  . Right Hand - Pain  . Neck - Pain    HPI patient is a pleasant 64 year old gentleman who presents to our clinic today chronic neck pain and bilateral hand paresthesias.  We have been seeing him recently for the paresthesias of his hands as well as multiple joint pain.  Rheumatoid factor was positive so he is referred to rheumatology who ruled out rheumatoid arthritis based on clinical exam and x-ray findings.  He has previously had bilateral nerve conduction studies which showed mild carpal tunnel syndrome mostly clinical in nature.  Both carpal tunnels were injected with  cortisone.  Neither injection helped even during the anesthetic phase.  In regards to his present neck pain, he notes that he has had this for many years.  Pain is worse on the left side.  He has increased pain with flexion of the neck as well as with rotation to the left.  The paresthesias he has are to the thumb, index, long and ring fingers.  No weakness.  Review of Systems as detailed in HPI.  All others reviewed and are negative.   Objective: Vital Signs: There were no vitals taken for this visit.  Physical Exam well-developed well-nourished gentleman in no acute distress.  Alert and oriented x3.  Ortho Exam cervical spine exam shows no spinous tenderness.  He has moderate parascapular tenderness left greater than right.  He has increased pain with rotation to the left as well as with flexion of the neck.  No focal weakness.  He is neurovascular intact distally.  Specialty Comments:  No specialty comments available.  Imaging: XR Cervical Spine 2 or 3 views  Result Date: 07/04/2020 Multilevel spondylosis.  Straightening of the cervical spine.      PMFS History: Patient Active Problem List   Diagnosis Date Noted  . GERD (gastroesophageal reflux  disease) 06/28/2020  . Hyperlipidemia 06/28/2020  . Hypertension 06/28/2020  . Bilateral hand pain 06/28/2020  . Rheumatoid factor positive 06/28/2020  . Anxiety 12/09/2018   Past Medical History:  Diagnosis Date  . Anxiety   . Asthma   . Atopic dermatitis   . Bee sting   . BMI 29.0-29.9,adult   . Cervical radiculopathy   . Chronic insomnia   . Complaint of paresthesia   . Elevated blood pressure reading   . Epididymitis   . Family history of prostate cancer   . GERD (gastroesophageal reflux disease)   . Gout   . Hypertension   . Lateral epicondylitis of right elbow   . Pure hypercholesterolemia   . Seasonal allergic rhinitis due to pollen     Family History  Problem Relation Age of Onset  . Healthy Son   . Healthy Son    . Lupus Niece     Past Surgical History:  Procedure Laterality Date  . VASECTOMY     Per patient   Social History   Occupational History  . Not on file  Tobacco Use  . Smoking status: Former Smoker    Quit date: 05/16/2012    Years since quitting: 8.1  . Smokeless tobacco: Never Used  Vaping Use  . Vaping Use: Never used  Substance and Sexual Activity  . Alcohol use: Yes  . Drug use: Yes    Frequency: 4.0 times per week    Types: Marijuana  . Sexual activity: Not on file

## 2020-07-14 ENCOUNTER — Other Ambulatory Visit (HOSPITAL_COMMUNITY)
Admission: RE | Admit: 2020-07-14 | Discharge: 2020-07-14 | Disposition: A | Discharge status: Home / Self Care | Payer: 59 | Source: Ambulatory Visit | Attending: Cardiology | Admitting: Cardiology

## 2020-07-14 DIAGNOSIS — R768 Other specified abnormal immunological findings in serum: Secondary | ICD-10-CM | POA: Diagnosis present

## 2020-07-14 DIAGNOSIS — Z20822 Contact with and (suspected) exposure to covid-19: Secondary | ICD-10-CM | POA: Insufficient documentation

## 2020-07-14 LAB — SARS CORONAVIRUS 2 (TAT 6-24 HRS): SARS Coronavirus 2: NEGATIVE

## 2020-07-18 ENCOUNTER — Ambulatory Visit (INDEPENDENT_AMBULATORY_CARE_PROVIDER_SITE_OTHER): Payer: 59

## 2020-07-18 ENCOUNTER — Other Ambulatory Visit: Payer: Self-pay

## 2020-07-18 DIAGNOSIS — I10 Essential (primary) hypertension: Secondary | ICD-10-CM

## 2020-07-18 DIAGNOSIS — R079 Chest pain, unspecified: Secondary | ICD-10-CM | POA: Diagnosis not present

## 2020-07-18 DIAGNOSIS — E7800 Pure hypercholesterolemia, unspecified: Secondary | ICD-10-CM

## 2020-07-18 LAB — EXERCISE TOLERANCE TEST
Estimated workload: 8.6 METS
Exercise duration (min): 6 min
Exercise duration (sec): 31 s
MPHR: 158 {beats}/min
Peak HR: 139 {beats}/min
Percent HR: 87 %
RPE: 16
Rest HR: 66 {beats}/min

## 2020-07-27 ENCOUNTER — Ambulatory Visit: Payer: 59 | Attending: Physician Assistant | Admitting: Physical Therapy

## 2020-07-27 ENCOUNTER — Encounter: Payer: Self-pay | Admitting: *Deleted

## 2020-07-28 ENCOUNTER — Ambulatory Visit: Payer: 59 | Admitting: Orthopaedic Surgery

## 2020-07-28 ENCOUNTER — Other Ambulatory Visit: Payer: Self-pay

## 2020-08-02 ENCOUNTER — Ambulatory Visit: Payer: 59 | Admitting: Orthopaedic Surgery

## 2020-08-02 ENCOUNTER — Telehealth: Payer: Self-pay | Admitting: Cardiology

## 2020-08-02 NOTE — Telephone Encounter (Signed)
Left the patient a message to call back regarding recent results.  

## 2020-08-02 NOTE — Telephone Encounter (Signed)
Patient wanted to discuss the letter he got about his test results.

## 2020-08-03 NOTE — Telephone Encounter (Signed)
Jake Bathe, MD  07/18/2020 6:38 PM EST      Personally reviewed treadmill test. Overall reassuring. Continue with hypertension control.  No further cardiac work-up at this time.  Donato Schultz, MD

## 2020-08-03 NOTE — Telephone Encounter (Signed)
Spoke with pt and reviewed stress test results as documented.  Advised pt to continue to monitor blood pressure and make sure it is treated and within normal range.  He states understanding and thanked me for the call.

## 2020-08-08 ENCOUNTER — Ambulatory Visit (INDEPENDENT_AMBULATORY_CARE_PROVIDER_SITE_OTHER): Payer: 59 | Admitting: Orthopaedic Surgery

## 2020-08-08 ENCOUNTER — Other Ambulatory Visit: Payer: Self-pay

## 2020-08-08 ENCOUNTER — Encounter: Payer: Self-pay | Admitting: Orthopaedic Surgery

## 2020-08-08 ENCOUNTER — Ambulatory Visit (INDEPENDENT_AMBULATORY_CARE_PROVIDER_SITE_OTHER): Payer: 59

## 2020-08-08 VITALS — Ht 67.0 in | Wt 176.0 lb

## 2020-08-08 DIAGNOSIS — M5412 Radiculopathy, cervical region: Secondary | ICD-10-CM | POA: Diagnosis not present

## 2020-08-08 DIAGNOSIS — G8929 Other chronic pain: Secondary | ICD-10-CM

## 2020-08-08 DIAGNOSIS — M5442 Lumbago with sciatica, left side: Secondary | ICD-10-CM

## 2020-08-08 DIAGNOSIS — M5441 Lumbago with sciatica, right side: Secondary | ICD-10-CM

## 2020-08-08 NOTE — Progress Notes (Signed)
Office Visit Note   Patient: David Graves           Date of Birth: 11-Jun-1956           MRN: [id number] Visit Date: 08/08/2020              Requested by: Mila Palmer, MD [address] [place],  [place] [address] PCP: Mila Palmer, MD   Assessment & Plan: Visit Diagnoses:  1. Chronic bilateral low back pain with bilateral sciatica   2. Radiculopathy of cervical spine     Plan: Pression is chronic neck and back pain with bilateral upper and lower extremity radiculopathy.  At this point, we will obtain open MRIs of the cervical and lumbar spines to further assess for structural abnormalities.  He will follow up with [place] once that has been completed.  He would not like to take any more medication at this point.  Follow-Up Instructions: Return for after MRI.   Orders:  Orders Placed This Encounter  Procedures  . XR Lumbar Spine 2-3 Views  . MR Lumbar Spine w/o contrast  . MR Cervical Spine w/o contrast   No orders of the defined types were placed in this encounter.     Procedures: No procedures performed   Clinical Data: No additional findings.   Subjective: Chief Complaint  Patient presents with  . Neck - Follow-up    HPI patient is a pleasant 64 year old gentleman who comes in today with recurrent neck pain and bilateral upper extremity radiculopathy as well as chronic back pain and bilateral lower extremity radiculopathy for many years.  In regards to his neck, we recently saw him for this where he was started on a steroid taper muscle relaxer as well as referred to physical therapy.  He denies any relief with the medication.  He has not started physical therapy as he had mixed up his appointment with our appointment today.  In regards to his back, he has had pain here for years.  He has pain that also radiates down the buttocks and into the back to legs and feet on both sides right greater than left.  His pain is worse with lying down.  He takes  Tylenol without relief of symptoms.  He does note tingling into both feet.  He has had a previous epidural steroid injection in the past without significant relief.  No bowel or bladder change.  Review of Systems as detailed in HPI.  All others reviewed and are negative.   Objective: Vital Signs: Ht 5\' 7"  (1.702 m)   Wt 176 lb (79.8 kg)   BMI 27.57 kg/m   Physical Exam well-developed well-nourished gentleman in no acute distress.  Alert oriented x3.  Ortho Exam cervical spine exam shows mild spinous paraspinous tenderness.  He has increased pain with cervical spine flexion.  Lumbar exam shows increased pain with lumbar flexion.  Markedly positive straight leg raise both sides.  No spinous tenderness but he has right-sided paraspinous tenderness.  No focal weakness.  Is neurovascular intact distally.    Specialty Comments:  No specialty comments available.  Imaging: XR Lumbar Spine 2-3 Views  Result Date: 08/08/2020 Diffuse spondylosis worse at L5-S1 with a retrolisthesis L4    PMFS History: Patient Active Problem List   Diagnosis Date Noted  . GERD (gastroesophageal reflux disease) 06/28/2020  . Hyperlipidemia 06/28/2020  . Hypertension 06/28/2020  . Bilateral hand pain 06/28/2020  . Rheumatoid factor positive 06/28/2020  . Anxiety 12/09/2018  Past Medical History:  Diagnosis Date  . Anxiety   . Asthma   . Atopic dermatitis   . Bee sting   . BMI 29.0-29.9,adult   . Cervical radiculopathy   . Chronic insomnia   . Complaint of paresthesia   . Elevated blood pressure reading   . Epididymitis   . Family history of prostate cancer   . GERD (gastroesophageal reflux disease)   . Gout   . Hypertension   . Lateral epicondylitis of right elbow   . Pure hypercholesterolemia   . Seasonal allergic rhinitis due to pollen     Family History  Problem Relation Age of Onset  . Healthy Son   . Healthy Son   . Lupus Niece     Past Surgical History:  Procedure Laterality  Date  . VASECTOMY     Per patient   Social History   Occupational History  . Not on file  Tobacco Use  . Smoking status: Former Smoker    Quit date: 05/16/2012    Years since quitting: 8.2  . Smokeless tobacco: Never Used  Vaping Use  . Vaping Use: Never used  Substance and Sexual Activity  . Alcohol use: Yes  . Drug use: Yes    Frequency: 4.0 times per week    Types: Marijuana  . Sexual activity: Not on file

## 2020-09-04 ENCOUNTER — Other Ambulatory Visit: Payer: Self-pay | Admitting: Physician Assistant

## 2020-09-04 ENCOUNTER — Telehealth: Payer: Self-pay

## 2020-09-04 MED ORDER — DIAZEPAM 2 MG PO TABS: ORAL_TABLET | ORAL | 0 refills | Status: DC

## 2020-09-04 NOTE — Telephone Encounter (Signed)
See below

## 2020-09-04 NOTE — Telephone Encounter (Signed)
Patient called he is requesting a high dose of vallum so he can complete his MRI tomorrow CB:[phone].

## 2020-09-04 NOTE — Telephone Encounter (Signed)
Done

## 2020-09-05 ENCOUNTER — Other Ambulatory Visit: Payer: Self-pay

## 2020-09-05 ENCOUNTER — Telehealth: Payer: Self-pay | Admitting: Orthopaedic Surgery

## 2020-09-05 ENCOUNTER — Ambulatory Visit
Admission: RE | Admit: 2020-09-05 | Discharge: 2020-09-05 | Disposition: A | Discharge status: Home / Self Care | Payer: 59 | Source: Ambulatory Visit | Attending: Orthopaedic Surgery | Admitting: Orthopaedic Surgery

## 2020-09-05 DIAGNOSIS — G8929 Other chronic pain: Secondary | ICD-10-CM

## 2020-09-05 DIAGNOSIS — M5412 Radiculopathy, cervical region: Secondary | ICD-10-CM

## 2020-09-05 DIAGNOSIS — M5441 Lumbago with sciatica, right side: Secondary | ICD-10-CM

## 2020-09-05 MED ORDER — DIAZEPAM 5 MG PO TABS
5.0000 mg | ORAL_TABLET | Freq: Once | ORAL | 0 refills | Status: AC
Start: 2020-09-05 — End: 2020-09-05

## 2020-09-05 NOTE — Telephone Encounter (Signed)
Pt called stating he had an Mri apt today.  But he couldn't go through due to the fact that he is too claustrophobic.  Pt would like to know if he has any other options on what he could do

## 2020-09-05 NOTE — Telephone Encounter (Signed)
Do you want to resubmit order and make it an open unit? Do you want to send in something to relax him during procedure?

## 2020-09-05 NOTE — Telephone Encounter (Signed)
I sent valium. 

## 2020-09-06 NOTE — Telephone Encounter (Signed)
Do we need to put in new order?

## 2020-09-07 NOTE — Telephone Encounter (Signed)
Let's try an open MRI

## 2020-09-07 NOTE — Telephone Encounter (Signed)
Thinks he may not be able to do this Procedure. PCP had sent something to relax him but did not work.He will need to be asleep.   Please advise on what the next step is.

## 2020-09-07 NOTE — Telephone Encounter (Signed)
No need for new order, pt can call to reschedule.

## 2020-09-08 NOTE — Telephone Encounter (Signed)
He should just come back for an appt thanks

## 2020-09-08 NOTE — Telephone Encounter (Signed)
He states he thinks he cannot do this even as an open MRI. He will need to be asleep in order to have this done.  Please advise.

## 2020-09-08 NOTE — Telephone Encounter (Signed)
Appt made

## 2020-09-12 ENCOUNTER — Ambulatory Visit (INDEPENDENT_AMBULATORY_CARE_PROVIDER_SITE_OTHER): Payer: 59 | Admitting: Orthopaedic Surgery

## 2020-09-12 ENCOUNTER — Other Ambulatory Visit: Payer: Self-pay

## 2020-09-12 DIAGNOSIS — M542 Cervicalgia: Secondary | ICD-10-CM

## 2020-09-12 DIAGNOSIS — M5442 Lumbago with sciatica, left side: Secondary | ICD-10-CM

## 2020-09-12 DIAGNOSIS — M5441 Lumbago with sciatica, right side: Secondary | ICD-10-CM

## 2020-09-12 DIAGNOSIS — M5412 Radiculopathy, cervical region: Secondary | ICD-10-CM

## 2020-09-12 DIAGNOSIS — G8929 Other chronic pain: Secondary | ICD-10-CM

## 2020-09-12 NOTE — Progress Notes (Signed)
Office Visit Note   Patient: David Graves           Date of Birth: Jun 16, 1956           MRN: [id number] Visit Date: 09/12/2020              Requested by: Mila Palmer, MD [address] [place],  [place] [address] PCP: Mila Palmer, MD   Assessment & Plan: Visit Diagnoses:  1. Chronic bilateral low back pain with bilateral sciatica   2. Neck pain   3. Radiculopathy of cervical spine     Plan: Impression is 65 year old gentleman with chronic neck and back pain unable to tolerate MRI.  He is requesting general anesthesia for the MRI but I am hesitant to put him through that if this can be avoided.  Therefore based on discussion of he has agreed to a referral to Dr. Otelia Sergeant for his expertise in neck and back issues.    Follow-Up Instructions: Return for needs appointment with Dr. Otelia Sergeant for evaluation of continued neck and back pain.   Orders:  No orders of the defined types were placed in this encounter.  No orders of the defined types were placed in this encounter.     Procedures: No procedures performed   Clinical Data: No additional findings.   Subjective: Chief Complaint  Patient presents with  . Spine - Pain, Follow-up    [name] returns today for for chronic neck and back pain.  He is unable to go through with the MRI due to claustrophobia and the Valium did not help.  He does not feel that he can tolerate an open MRI either.  Denies any changes otherwise.   Review of Systems   Objective: Vital Signs: There were no vitals taken for this visit.  Physical Exam  Ortho Exam Exams are unchanged. Specialty Comments:  No specialty comments available.  Imaging: No results found.   PMFS History: Patient Active Problem List   Diagnosis Date Noted  . GERD (gastroesophageal reflux disease) 06/28/2020  . Hyperlipidemia 06/28/2020  . Hypertension 06/28/2020  . Bilateral hand pain 06/28/2020  . Rheumatoid factor positive 06/28/2020   . Anxiety 12/09/2018   Past Medical History:  Diagnosis Date  . Anxiety   . Asthma   . Atopic dermatitis   . Bee sting   . BMI 29.0-29.9,adult   . Cervical radiculopathy   . Chronic insomnia   . Complaint of paresthesia   . Elevated blood pressure reading   . Epididymitis   . Family history of prostate cancer   . GERD (gastroesophageal reflux disease)   . Gout   . Hypertension   . Lateral epicondylitis of right elbow   . Pure hypercholesterolemia   . Seasonal allergic rhinitis due to pollen     Family History  Problem Relation Age of Onset  . Healthy Son   . Healthy Son   . Lupus Niece     Past Surgical History:  Procedure Laterality Date  . VASECTOMY     Per patient   Social History   Occupational History  . Not on file  Tobacco Use  . Smoking status: Former Smoker    Quit date: 05/16/2012    Years since quitting: 8.3  . Smokeless tobacco: Never Used  Vaping Use  . Vaping Use: Never used  Substance and Sexual Activity  . Alcohol use: Yes  . Drug use: Yes    Frequency: 4.0 times per week  Types: Marijuana  . Sexual activity: Not on file

## 2020-09-27 ENCOUNTER — Ambulatory Visit (INDEPENDENT_AMBULATORY_CARE_PROVIDER_SITE_OTHER): Payer: 59 | Admitting: Specialist

## 2020-09-27 ENCOUNTER — Encounter: Payer: Self-pay | Admitting: Specialist

## 2020-09-27 ENCOUNTER — Other Ambulatory Visit: Payer: Self-pay

## 2020-09-27 ENCOUNTER — Ambulatory Visit (INDEPENDENT_AMBULATORY_CARE_PROVIDER_SITE_OTHER): Payer: 59

## 2020-09-27 VITALS — BP 164/79 | HR 60 | Ht 67.0 in | Wt 176.0 lb

## 2020-09-27 DIAGNOSIS — M5442 Lumbago with sciatica, left side: Secondary | ICD-10-CM

## 2020-09-27 DIAGNOSIS — M4807 Spinal stenosis, lumbosacral region: Secondary | ICD-10-CM | POA: Diagnosis not present

## 2020-09-27 DIAGNOSIS — M542 Cervicalgia: Secondary | ICD-10-CM

## 2020-09-27 DIAGNOSIS — G5602 Carpal tunnel syndrome, left upper limb: Secondary | ICD-10-CM

## 2020-09-27 DIAGNOSIS — M5441 Lumbago with sciatica, right side: Secondary | ICD-10-CM

## 2020-09-27 DIAGNOSIS — I70209 Unspecified atherosclerosis of native arteries of extremities, unspecified extremity: Secondary | ICD-10-CM

## 2020-09-27 DIAGNOSIS — G8929 Other chronic pain: Secondary | ICD-10-CM

## 2020-09-27 DIAGNOSIS — G5601 Carpal tunnel syndrome, right upper limb: Secondary | ICD-10-CM

## 2020-09-27 DIAGNOSIS — M4726 Other spondylosis with radiculopathy, lumbar region: Secondary | ICD-10-CM

## 2020-09-27 DIAGNOSIS — M4802 Spinal stenosis, cervical region: Secondary | ICD-10-CM

## 2020-09-27 DIAGNOSIS — G5621 Lesion of ulnar nerve, right upper limb: Secondary | ICD-10-CM

## 2020-09-27 DIAGNOSIS — R202 Paresthesia of skin: Secondary | ICD-10-CM

## 2020-09-27 DIAGNOSIS — M4316 Spondylolisthesis, lumbar region: Secondary | ICD-10-CM

## 2020-09-27 DIAGNOSIS — M5412 Radiculopathy, cervical region: Secondary | ICD-10-CM

## 2020-09-27 MED ORDER — METHYLPREDNISOLONE 4 MG PO TABS: ORAL_TABLET | ORAL | 0 refills | Status: DC

## 2020-09-27 MED ORDER — GABAPENTIN 100 MG PO CAPS: 100.0000 mg | ORAL_CAPSULE | Freq: Every day | ORAL | 3 refills | Status: DC

## 2020-09-27 NOTE — Patient Instructions (Addendum)
Avoid bending, stooping and avoid lifting weights greater than 10 lbs. Avoid prolong standing and walking. Avoid frequent bending and stooping  No lifting greater than 10 lbs. May use ice or moist heat for pain. Weight loss is of benefit. Handicap license is approved.  Weight loss, you have been advise to avoid NSIADs like diclofenac motrin and naprosyn and exercise. Avoid high impact sports. Stationary bike and pool walking are adviseable ways to continue to exercise.  Avoid overhead lifting and overhead use of the arms. Do not lift greater than 5 lbs. Adjust head rest in vehicle to prevent hyperextension if rear ended. Take extra precautions to avoid falling, including use of a cane if you feel weak. Gabapentin 100 mg po qhs. Medrol 4mg  daily for 2 weeks then 2mg  daily for two weeks.  Injection with steroid may be of benefit. Hemp CBD capsules, amazon.com 5,000-7,000 mg per bottle, 60 capsules per bottle, take one capsule twice a day.  Follow-Up Instructions: No follow-ups on file.

## 2020-09-27 NOTE — Progress Notes (Signed)
Office Visit Note   Patient: David Graves           Date of Birth: November 12, 1955           MRN: [id number] Visit Date: 09/27/2020              Requested by: Mila Palmer, MD [address] [place],  [place] [address] PCP: Mila Palmer, MD   Assessment & Plan: Visit Diagnoses:  1. Chronic bilateral low back pain with bilateral sciatica   2. Neck pain   3. Spinal stenosis of lumbosacral region   4. Spondylolisthesis of lumbar region   5. Paresthesias in left hand   6. Paresthesias in right hand   7. Right carpal tunnel syndrome   8. Left carpal tunnel syndrome   9. Radiculopathy of cervical spine   10. Other spondylosis with radiculopathy, lumbar region   11. Spinal stenosis of cervical region   12. Cubital tunnel syndrome, right   13. Atherosclerotic peripheral vascular disease (HCC)     Plan: Avoid bending, stooping and avoid lifting weights greater than 10 lbs. Avoid prolong standing and walking. Avoid frequent bending and stooping  No lifting greater than 10 lbs. May use ice or moist heat for pain. Weight loss is of benefit. Handicap license is approved.  Weight loss, you have been advise to avoid NSIADs like diclofenac motrin and naprosyn and exercise. Avoid high impact sports. Stationary bike and pool walking are adviseable ways to continue to exercise.  Avoid overhead lifting and overhead use of the arms. Do not lift greater than 5 lbs. Adjust head rest in vehicle to prevent hyperextension if rear ended. Take extra precautions to avoid falling, including use of a cane if you feel weak. MRI of the neck and the lumbar spine under general anesthesia is recommended and order. Lower extremity and Aortoiliac evaluation for peripheral vascular disease is ordered. Gabapentin 100 mg po qhs. Medrol 4mg  daily for 2 weeks then 2mg  daily for two weeks. Injection with steroid may be of benefit. Hemp CBD capsules, amazon.com 5,000-7,000 mg per bottle, 60  capsules per bottle, take one capsule twice a day.  Follow-Up Instructions: No follow-ups on file.   Follow-Up Instructions: Return in about 4 weeks (around 10/25/2020).   Orders:  Orders Placed This Encounter  Procedures  . XR Lumb Spine Flex&Ext Only  . XR Cervical Spine 2 or 3 views  . MR Cervical Spine w/o contrast  . MR Lumbar Spine w/o contrast  . AORTA/ILIAC DOPPLER COMPLETE   No orders of the defined types were placed in this encounter.     Procedures: No procedures performed   Clinical Data: No additional findings.   Subjective: Chief Complaint  Patient presents with  . Neck - Pain, Numbness  . Lower Back - Pain, Numbness    65 year old right handed male with history of neck and low back pain. The pain in the neck and lower back is to the point where he has stopped working in [place] [employer]. 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 He reports having to stop work due to difficulty using his hands. He has pain shooting up the arms and just sitting at the lap top increase the arm and neck pain. The pain is no better even with stopping work. He has difficulty driving even to [facility] he notices increasing pain into the volar right forearm, but into both arms  also. He underwent EMG/NCVs in [place], ordered by Dr. Paulino Rily. These showed  Bilateral CTS. Repeat studies by Dr. Alvester Morin, Cyndia Skeeters demonstrated concern of cervical origin of the numbness and pain. He noticed some difficulty with removing bottle and jar tops. No clumbsiness. No bowel or bladder difficulty. He has no difficulty with walking. He has a  History of vertigo and inner ear difficulty diagnosed about 10 years in [place] by neurologist.  He has had MRI of his lower back in [place] years ago. Unable to tolerate the MRI due to claustrophobia. There is no leg weakness, He has difficulty walking too far. Walking  About 1/4 mi he feel like his  having to crawl with leg tireness.  He has been told that he has bone on bone disc in the lumbar. He has night pain, takes tylenol for pain. Even without working he reports constant pain, had heart checked due to left arm pain and cramping. There is  stiffness in the neck and he has difficulty with bending and reaching his feet.    Review of Systems  Constitutional: Negative.   HENT: Negative.   Eyes: Negative.   Respiratory: Negative.   Cardiovascular: Negative.   Gastrointestinal: Negative.   Endocrine: Negative.   Genitourinary: Negative.   Musculoskeletal: Negative.   Skin: Negative.   Allergic/Immunologic: Negative.   Neurological: Negative.   Hematological: Negative.   Psychiatric/Behavioral: Negative.      Objective: Vital Signs: BP (!) 164/79 (BP Location: Left Arm, Patient Position: Sitting)   Pulse 60   Ht 5\' 7"  (1.702 m)   Wt 176 lb (79.8 kg)   BMI 27.57 kg/m   Physical Exam Constitutional:      Appearance: He is well-developed and well-nourished.  HENT:     Head: Normocephalic and atraumatic.  Eyes:     Extraocular Movements: EOM normal.     Pupils: Pupils are equal, round, and reactive to light.  Pulmonary:     Effort: Pulmonary effort is normal.     Breath sounds: Normal breath sounds.  Abdominal:     General: Bowel sounds are normal.     Palpations: Abdomen is soft.  Musculoskeletal:        General: Normal range of motion.     Cervical back: Normal range of motion and neck supple.  Skin:    General: Skin is warm and dry.  Neurological:     Mental Status: He is alert and oriented to person, place, and time.  Psychiatric:        Mood and Affect: Mood and affect normal.        Behavior: Behavior normal.        Thought Content: Thought content normal.        Judgment: Judgment normal.     Ortho Exam  Specialty Comments:  No specialty comments available.  Imaging: XR Lumb Spine Flex&Ext Only  Result Date: 09/27/2020 AP and lateral flexion  and extension radiographs demonstrate degenerative disc disease L5-S1 greater than L2-3 greater than L3-4 and then L4-5. Grade 1 anterolisthesis at L2-3 2-3 mm not changed with flexion and extension. Right SI greater than left SI sclerosis, mild right SI joint line narrowing.   XR Cervical Spine 2 or 3 views  Result Date: 09/27/2020 AP and lateral flexion and extension radiographs show DDD C4-5, C5-6 and C6-7 with straightening of the normal cervical lordotic curve through this area. Bilateral Uncovertebral spondylosis is present at each of these levels. The spinal canal Torg ratio is at  about 0.7.0.8 suggesting cervical stenosis in addition to spondylosis and degeneratived disc disease.     PMFS History: Patient Active Problem List   Diagnosis Date Noted  . GERD (gastroesophageal reflux disease) 06/28/2020  . Hyperlipidemia 06/28/2020  . Hypertension 06/28/2020  . Bilateral hand pain 06/28/2020  . Rheumatoid factor positive 06/28/2020  . Anxiety 12/09/2018   Past Medical History:  Diagnosis Date  . Anxiety   . Asthma   . Atopic dermatitis   . Bee sting   . BMI 29.0-29.9,adult   . Cervical radiculopathy   . Chronic insomnia   . Complaint of paresthesia   . Elevated blood pressure reading   . Epididymitis   . Family history of prostate cancer   . GERD (gastroesophageal reflux disease)   . Gout   . Hypertension   . Lateral epicondylitis of right elbow   . Pure hypercholesterolemia   . Seasonal allergic rhinitis due to pollen     Family History  Problem Relation Age of Onset  . Healthy Son   . Healthy Son   . Lupus Niece     Past Surgical History:  Procedure Laterality Date  . VASECTOMY     Per patient   Social History   Occupational History  . Not on file  Tobacco Use  . Smoking status: Former Smoker    Quit date: 05/16/2012    Years since quitting: 8.3  . Smokeless tobacco: Never Used  Vaping Use  . Vaping Use: Never used  Substance and Sexual Activity  .  Alcohol use: Yes  . Drug use: Yes    Frequency: 4.0 times per week    Types: Marijuana  . Sexual activity: Not on file

## 2020-10-02 ENCOUNTER — Telehealth: Payer: Self-pay | Admitting: Specialist

## 2020-10-02 ENCOUNTER — Telehealth (HOSPITAL_COMMUNITY): Payer: Self-pay

## 2020-10-02 NOTE — Telephone Encounter (Signed)
[facility] cancelled appt for appt due to pt being hospitalized.

## 2020-10-14 ENCOUNTER — Ambulatory Visit (HOSPITAL_COMMUNITY)
Admission: RE | Admit: 2020-10-14 | Discharge: 2020-10-14 | Disposition: A | Discharge status: Home / Self Care | Payer: 59 | Source: Ambulatory Visit | Attending: Specialist | Admitting: Specialist

## 2020-10-14 DIAGNOSIS — Z20822 Contact with and (suspected) exposure to covid-19: Secondary | ICD-10-CM | POA: Diagnosis not present

## 2020-10-14 DIAGNOSIS — Z01812 Encounter for preprocedural laboratory examination: Secondary | ICD-10-CM | POA: Insufficient documentation

## 2020-10-14 LAB — SARS CORONAVIRUS 2 (TAT 6-24 HRS): SARS Coronavirus 2: NEGATIVE

## 2020-10-16 ENCOUNTER — Encounter (HOSPITAL_COMMUNITY): Payer: Self-pay | Admitting: *Deleted

## 2020-10-16 ENCOUNTER — Other Ambulatory Visit: Payer: Self-pay

## 2020-10-16 NOTE — Progress Notes (Signed)
Patient denies shortness of breath, fever, cough or chest pain.  PCP - Dr Mila Palmer Cardiologist - Dr Donato Schultz  Chest x-ray - n/a EKG - 03/16/20 Stress Test - 07/18/20 ECHO - 04/05/20 Cardiac Cath - n/a  Anesthesia review: Yes  Coronavirus Screening Covid test on 10/14/20 was negative.  Patient verbalized understanding of instructions that were given via phone.

## 2020-10-16 NOTE — Progress Notes (Signed)
Anesthesia Chart Review: David Graves   Case: [id number] Date/Time: 10/17/20 0945   Procedure: MRI WITH ANESTHESIA  LUMBAR WITHOUT CONTRAST AND CERVICAL WITHOUT CONTRAST (N/A )   Anesthesia type: General   Pre-op diagnosis: NECK PAIN   Location: [facility]OLOG[facility] / [facility]   Surgeons: Radiologist, Medication, MD      DISCUSSION: Patient is a 65 year old male scheduled for MRI of the lumbar and cervical spine on 10/17/2020. Progress Note/H&P per Vira Browns, MD on 09/27/20.   History includes former tobacco smoker (quit 05/16/12), + THC use, HTN, GERD, insomnia, hypercholesterolemia, anxiety, asthma.   Patient evaluated by cardiologist Dr. Anne Fu on 05/17/20 for chest burning, non-exertional. Insurance did not approve a coronary CTA. Nuclear stress test was discussed but he reported being "too claustrophobic to have the imaging", so an ETT was ordered and done on 07/18/20. Results showed mild ST elevation (< 86mm) at peak exercise felt most consistent with J point elevation, given hypertensive response (BP >230 at exercise) could not determine if this was due to ischemia or elevated BP. Dr. Anne Fu personally reviewed treadmill test and felt results were "Overall reassuring. Continue with hypertension control. No further cardiac work-up at this time."  10/14/20 preprocedure COVID-19 test negative. Anesthesia team to evaluate on the day of procedure. He denied chest pain and SOB at PAT RN phone interview.   VS: Ht 5\' 7"  (1.702 m)   Wt 79.8 kg   BMI 27.57 kg/m   BP Readings from Last 3 Encounters:  09/27/20 (!) 164/79  06/28/20 (!) 153/80  05/17/20 114/70   Pulse Readings from Last 3 Encounters:  09/27/20 60  06/28/20 61  05/17/20 62    PROVIDERS: 05/19/20, MD is PCP  Mila Palmer, MD is cardiologist Donato Schultz, MD is rheumatologist   LABS: For day of procedure. Comparison CMET 09/01/19 in Care Everywhere.   IMAGES: L-spine and C-spine xrays on 2/22/2 showed: - AP and  lateral flexion and extension radiographs show DDD C4-5, C5-6 and  C6-7 with straightening of the normal cervical lordotic curve through this  area. Bilateral Uncovertebral spondylosis is present at each of these  levels. The spinal canal Torg ratio is at about 0.7.0.8 suggesting  cervical stenosis in addition to spondylosis and degeneratived disc  disease.  - AP and lateral flexion and extension radiographs demonstrate degenerative  disc disease L5-S1 greater than L2-3 greater than L3-4 and then L4-5.  Grade 1 anterolisthesis at L2-3 2-3 mm not changed with flexion and  extension. Right SI greater than left SI sclerosis, mild right SI joint  line narrowing.    EKG: 03/16/20 [facility]): NSR   CV: Echo 04/05/20: IMPRESSIONS  1. Left ventricular ejection fraction, by estimation, is 60 to 65%. The  left ventricle has normal function. The left ventricle has no regional  wall motion abnormalities. There is mild left ventricular hypertrophy.  Left ventricular diastolic parameters  are indeterminate.  2. Right ventricular systolic function is normal. The right ventricular  size is normal. There is normal pulmonary artery systolic pressure.  3. The mitral valve is normal in structure. Trivial mitral valve  regurgitation. No evidence of mitral stenosis.  4. The aortic valve is normal in structure. Aortic valve regurgitation is  not visualized. No aortic stenosis is present.  5. The inferior vena cava is normal in size with greater than 50%  respiratory variability, suggesting right atrial pressure of 3 mmHg.    ETT 07/18/20:  Blood pressure demonstrated a hypertensive response  to exercise.  ST segment elevation was noted during stress in the V1 leads.   There is mild ST elevation in V1 (<1 mm) at peak exercise and extending throughout recovery. Shape is most consistent with J point elevation. There is nonspecific ST upsloping depression at peak exercise/early recovery,  otherwise no significant abnormalities. Given hypertensive response (BP >230 at exercise) cannot determine if this is ischemia or due to elevated blood pressure. - Dr. Anne Fu reviewed on 07/18/20 and wrote, "Personally reviewed treadmill test. Overall reassuring. Continue with hypertension control. No further cardiac work-up at this time."   Past Medical History:  Diagnosis Date  . Anxiety   . Asthma    no inhaler  . Atopic dermatitis   . Bee sting   . BMI 29.0-29.9,adult   . Cervical radiculopathy   . Chronic insomnia   . Complaint of paresthesia   . Elevated blood pressure reading   . Epididymitis   . Family history of prostate cancer   . GERD (gastroesophageal reflux disease)   . Gout   . Hypertension   . Lateral epicondylitis of right elbow   . Pure hypercholesterolemia   . Seasonal allergic rhinitis due to pollen     Past Surgical History:  Procedure Laterality Date  . COLONOSCOPY  10/20/2017  . UPPER GI ENDOSCOPY    . VASECTOMY     Per patient    MEDICATIONS: No current facility-administered medications for this encounter.   Marland Kitchen acetaminophen (TYLENOL) 500 MG tablet  . amLODipine (NORVASC) 10 MG tablet  . aspirin EC 81 MG tablet  . atorvastatin (LIPITOR) 20 MG tablet  . EPINEPHrine 0.3 mg/0.3 mL IJ SOAJ injection  . hydrOXYzine (ATARAX/VISTARIL) 25 MG tablet  . pantoprazole (PROTONIX) 40 MG tablet  . diazepam (VALIUM) 2 MG tablet  . gabapentin (NEURONTIN) 100 MG capsule  . methocarbamol (ROBAXIN) 500 MG tablet  . methylPREDNISolone (MEDROL) 4 MG tablet  . metoprolol tartrate (LOPRESSOR) 50 MG tablet  . predniSONE (STERAPRED UNI-PAK 21 TAB) 10 MG (21) TBPK tablet    Shonna Chock, PA-C Surgical Short Stay/Anesthesiology [facility] Phone [phone] [facility] Phone [phone] 10/16/2020 1:19 PM

## 2020-10-16 NOTE — Anesthesia Preprocedure Evaluation (Addendum)
Anesthesia Evaluation  Patient identified by MRN, date of birth, ID band Patient awake    Reviewed: Allergy & Precautions, NPO status , Patient's Chart, lab work & pertinent test results  Airway Mallampati: II  TM Distance: >3 FB Neck ROM: Limited    Dental  (+) Dental Advisory Given   Pulmonary former smoker,    breath sounds clear to auscultation       Cardiovascular hypertension,  Rhythm:Regular Rate:Normal     Neuro/Psych    GI/Hepatic   Endo/Other    Renal/GU      Musculoskeletal   Abdominal   Peds  Hematology   Anesthesia Other Findings   Reproductive/Obstetrics                            Anesthesia Physical Anesthesia Plan  ASA: II  Anesthesia Plan: General   Post-op Pain Management:    Induction: Intravenous  PONV Risk Score and Plan: 2 and Dexamethasone, Ondansetron and Treatment may vary due to age or medical condition  Airway Management Planned: Oral ETT  Additional Equipment:   Intra-op Plan:   Post-operative Plan: Extubation in OR  Informed Consent: I have reviewed the patients History and Physical, chart, labs and discussed the procedure including the risks, benefits and alternatives for the proposed anesthesia with the patient or authorized representative who has indicated his/her understanding and acceptance.     Dental advisory given  Plan Discussed with: CRNA  Anesthesia Plan Comments: (PAT note written 10/16/2020 by Shonna Chock, PA-C. )       Anesthesia Quick Evaluation

## 2020-10-17 ENCOUNTER — Ambulatory Visit (HOSPITAL_COMMUNITY)
Admission: RE | Admit: 2020-10-17 | Discharge: 2020-10-17 | Disposition: A | Discharge status: Home / Self Care | Payer: 59 | Source: Ambulatory Visit | Attending: Specialist | Admitting: Specialist

## 2020-10-17 ENCOUNTER — Encounter (HOSPITAL_COMMUNITY): Payer: Self-pay | Admitting: Specialist

## 2020-10-17 ENCOUNTER — Encounter (HOSPITAL_COMMUNITY)
Admission: RE | Disposition: A | Discharge status: Home / Self Care | Payer: Self-pay | Source: Ambulatory Visit | Attending: Specialist

## 2020-10-17 ENCOUNTER — Ambulatory Visit (HOSPITAL_COMMUNITY): Payer: 59 | Admitting: Vascular Surgery

## 2020-10-17 ENCOUNTER — Ambulatory Visit (HOSPITAL_COMMUNITY): Payer: 59

## 2020-10-17 DIAGNOSIS — M48061 Spinal stenosis, lumbar region without neurogenic claudication: Secondary | ICD-10-CM | POA: Diagnosis not present

## 2020-10-17 DIAGNOSIS — M4802 Spinal stenosis, cervical region: Secondary | ICD-10-CM | POA: Insufficient documentation

## 2020-10-17 DIAGNOSIS — M542 Cervicalgia: Secondary | ICD-10-CM | POA: Diagnosis present

## 2020-10-17 DIAGNOSIS — R202 Paresthesia of skin: Secondary | ICD-10-CM

## 2020-10-17 DIAGNOSIS — M4807 Spinal stenosis, lumbosacral region: Secondary | ICD-10-CM

## 2020-10-17 DIAGNOSIS — M4316 Spondylolisthesis, lumbar region: Secondary | ICD-10-CM

## 2020-10-17 DIAGNOSIS — M47816 Spondylosis without myelopathy or radiculopathy, lumbar region: Secondary | ICD-10-CM | POA: Diagnosis not present

## 2020-10-17 HISTORY — PX: RADIOLOGY WITH ANESTHESIA: SHX6223

## 2020-10-17 LAB — BASIC METABOLIC PANEL
Anion gap: 13 (ref 5–15)
BUN: 12 mg/dL (ref 8–23)
CO2: 24 mmol/L (ref 22–32)
Calcium: 9.8 mg/dL (ref 8.9–10.3)
Chloride: 108 mmol/L (ref 98–111)
Creatinine, Ser: 0.89 mg/dL (ref 0.61–1.24)
GFR, Estimated: >60 mL/min (ref >60)
Glucose, Bld: 84 mg/dL (ref 70–99)
Potassium: 3.3 mmol/L — ABNORMAL LOW (ref 3.5–5.1)
Sodium: 145 mmol/L (ref 135–145)

## 2020-10-17 SURGERY — MRI WITH ANESTHESIA
Anesthesia: General

## 2020-10-17 MED ORDER — MIDAZOLAM HCL 5 MG/5ML IJ SOLN
INTRAMUSCULAR | Status: DC | PRN
  Administered 2020-10-17: 2 mg via INTRAVENOUS

## 2020-10-17 MED ORDER — PHENYLEPHRINE HCL-NACL 10-0.9 MG/250ML-% IV SOLN
INTRAVENOUS | Status: DC | PRN
  Administered 2020-10-17: 20 ug/min via INTRAVENOUS

## 2020-10-17 MED ORDER — ORAL CARE MOUTH RINSE: 15.0000 mL | Freq: Once | OROMUCOSAL | Status: AC

## 2020-10-17 MED ORDER — FENTANYL CITRATE (PF) 100 MCG/2ML IJ SOLN
INTRAMUSCULAR | Status: DC | PRN
  Administered 2020-10-17 (×2): 50 ug via INTRAVENOUS

## 2020-10-17 MED ORDER — CHLORHEXIDINE GLUCONATE 0.12 % MT SOLN
15.0000 mL | Freq: Once | OROMUCOSAL | Status: AC
  Administered 2020-10-17: 15 mL via OROMUCOSAL
  Filled 2020-10-17: qty 15

## 2020-10-17 MED ORDER — LACTATED RINGERS IV SOLN: INTRAVENOUS | Status: DC

## 2020-10-17 MED ORDER — DEXAMETHASONE SODIUM PHOSPHATE 10 MG/ML IJ SOLN
INTRAMUSCULAR | Status: DC | PRN
  Administered 2020-10-17: 10 mg via INTRAVENOUS

## 2020-10-17 MED ORDER — ROCURONIUM BROMIDE 10 MG/ML (PF) SYRINGE
PREFILLED_SYRINGE | INTRAVENOUS | Status: DC | PRN
  Administered 2020-10-17: 50 mg via INTRAVENOUS

## 2020-10-17 MED ORDER — ONDANSETRON HCL 4 MG/2ML IJ SOLN
INTRAMUSCULAR | Status: DC | PRN
  Administered 2020-10-17: 4 mg via INTRAVENOUS

## 2020-10-17 MED ORDER — LIDOCAINE 2% (20 MG/ML) 5 ML SYRINGE
INTRAMUSCULAR | Status: DC | PRN
  Administered 2020-10-17: 60 mg via INTRAVENOUS

## 2020-10-17 MED ORDER — SUGAMMADEX SODIUM 200 MG/2ML IV SOLN
INTRAVENOUS | Status: DC | PRN
  Administered 2020-10-17: 200 mg via INTRAVENOUS

## 2020-10-17 MED ORDER — PROPOFOL 10 MG/ML IV BOLUS
INTRAVENOUS | Status: DC | PRN
  Administered 2020-10-17: 170 mg via INTRAVENOUS

## 2020-10-17 NOTE — Anesthesia Procedure Notes (Signed)
Procedure Name: Intubation Date/Time: 10/17/2020 10:16 AM Performed by: Candis Shine, CRNA Pre-anesthesia Checklist: Patient identified, Emergency Drugs available, Suction available and Patient being monitored Patient Re-evaluated:Patient Re-evaluated prior to induction Oxygen Delivery Method: Circle System Utilized Preoxygenation: Pre-oxygenation with 100% oxygen Induction Type: IV induction Ventilation: Mask ventilation without difficulty Laryngoscope Size: Mac and 4 Grade View: Grade I Tube type: Oral Tube size: 7.5 mm Number of attempts: 1 Airway Equipment and Method: Stylet Placement Confirmation: ETT inserted through vocal cords under direct vision,  positive ETCO2 and breath sounds checked- equal and bilateral Secured at: 22 cm Tube secured with: Tape Dental Injury: Teeth and Oropharynx as per pre-operative assessment

## 2020-10-17 NOTE — Transfer of Care (Signed)
Immediate Anesthesia Transfer of Care Note  Patient: David Graves  Procedure(s) Performed: MRI WITH ANESTHESIA  LUMBAR WITHOUT CONTRAST AND CERVICAL WITHOUT CONTRAST (N/A )  Patient Location: PACU  Anesthesia Type:General  Level of Consciousness: awake, alert  and oriented  Airway & Oxygen Therapy: Patient Spontanous Breathing and Patient connected to face mask oxygen  Post-op Assessment: Report given to RN and Post -op Vital signs reviewed and stable  Post vital signs: Reviewed and stable  Last Vitals:  Vitals Value Taken Time  BP 143/91 10/17/20 1129  Temp 36.2 C 10/17/20 1129  Pulse 50 10/17/20 1130  Resp 17 10/17/20 1130  SpO2 100 % 10/17/20 1130  Vitals shown include unvalidated device data.  Last Pain:  Vitals:   10/17/20 1129  TempSrc:   PainSc: Asleep         Complications: No complications documented.

## 2020-10-17 NOTE — Anesthesia Postprocedure Evaluation (Signed)
Anesthesia Post Note  Patient: David Graves  Procedure(s) Performed: MRI WITH ANESTHESIA  LUMBAR WITHOUT CONTRAST AND CERVICAL WITHOUT CONTRAST (N/A )     Patient location during evaluation: PACU Anesthesia Type: General Level of consciousness: awake and alert Pain management: pain level controlled Vital Signs Assessment: post-procedure vital signs reviewed and stable Respiratory status: spontaneous breathing, nonlabored ventilation, respiratory function stable and patient connected to nasal cannula oxygen Cardiovascular status: blood pressure returned to baseline and stable Postop Assessment: no apparent nausea or vomiting Anesthetic complications: no   No complications documented.  Last Vitals:  Vitals:   10/17/20 1129 10/17/20 1200  BP: (!) 143/91 140/84  Pulse: (!) 56 (!) 50  Resp: 19 11  Temp: (!) 36.2 C   SpO2: 100% 99%    Last Pain:  Vitals:   10/17/20 1200  TempSrc:   PainSc: 0-No pain                 Kennieth Rad

## 2020-10-18 ENCOUNTER — Encounter (HOSPITAL_COMMUNITY): Payer: Self-pay | Admitting: Radiology

## 2020-10-25 ENCOUNTER — Ambulatory Visit (INDEPENDENT_AMBULATORY_CARE_PROVIDER_SITE_OTHER): Payer: 59 | Admitting: Specialist

## 2020-10-25 ENCOUNTER — Encounter: Payer: Self-pay | Admitting: Specialist

## 2020-10-25 VITALS — BP 156/79 | HR 57 | Ht 67.0 in | Wt 176.0 lb

## 2020-10-25 DIAGNOSIS — G5601 Carpal tunnel syndrome, right upper limb: Secondary | ICD-10-CM | POA: Diagnosis not present

## 2020-10-25 DIAGNOSIS — R202 Paresthesia of skin: Secondary | ICD-10-CM

## 2020-10-25 DIAGNOSIS — M5412 Radiculopathy, cervical region: Secondary | ICD-10-CM | POA: Diagnosis not present

## 2020-10-25 DIAGNOSIS — M4802 Spinal stenosis, cervical region: Secondary | ICD-10-CM

## 2020-10-25 DIAGNOSIS — G5602 Carpal tunnel syndrome, left upper limb: Secondary | ICD-10-CM | POA: Diagnosis not present

## 2020-10-25 DIAGNOSIS — M4726 Other spondylosis with radiculopathy, lumbar region: Secondary | ICD-10-CM | POA: Diagnosis not present

## 2020-10-25 DIAGNOSIS — M4316 Spondylolisthesis, lumbar region: Secondary | ICD-10-CM

## 2020-10-25 DIAGNOSIS — M4807 Spinal stenosis, lumbosacral region: Secondary | ICD-10-CM

## 2020-10-25 NOTE — Progress Notes (Signed)
Office Visit Note   Patient: David LambertRandolph Graves           Date of Birth: 04/27/1956           MRN: [id number] Visit Date: 10/25/2020              Requested by: Mila PalmerWolters, Sharon, MD [address] [place],  [place] [id number] PCP: Mila PalmerWolters, Sharon, MD   Assessment & Plan: Visit Diagnoses:  1. Left carpal tunnel syndrome   2. Other spondylosis with radiculopathy, lumbar region   3. Right carpal tunnel syndrome   4. Radiculopathy of cervical spine   5. Paresthesias in right hand   6. Paresthesias in left hand   7. Spinal stenosis of lumbosacral region   8. Spondylolisthesis of lumbar region   9. Spinal stenosis of cervical region     Plan:Avoid overhead lifting and overhead use of the arms. Do not lift greater than 5 lbs. Adjust head rest in vehicle to prevent hyperextension if rear ended. Take extra precautions to avoid falling, including use of a cane if you feel weak. Scheduling secretary Tivis RingerSherri Billings. will call you to arrange for surgery for your cervical spine. If you wish a second opinion please let us know and we can arrange for you. If you have worsening arm or leg numbness or weakness please call or go to an ER. We will contact your cardiologist and primary care physicians to seek clearance for your surgery. Surgery will be an anterior cervical discectomy and fusion at the C4-5, C5-6 and C6-7 levels with decompression of the cervical spinal canal at C4-5, C5-6 and C6-7 removal of bone pressing on the spinal cord and nerve roots.  An additional decompression, fusion is performed using allograft bone , vivigen and local bone grafting and plate and screws,  Risks of surgery include risks of infection, bleeding and risks to the spinal cord and  Risks of sore throat and difficulty swallowing which should  Improve over the next 4-6 weeks following surgery. Surgery is indicated due to upper extremity radiculopathy, lermittes phenomena and falls. In the future  surgery at adjacent levels may be necessary but these levels do not appear to be related to your current symptoms or signs.     Follow-Up Instructions: No follow-ups on file.   Orders:  No orders of the defined types were placed in this encounter.  No orders of the defined types were placed in this encounter.     Procedures: No procedures performed   Clinical Data: Findings:  CLINICAL DATA: Spondylolisthesis Spinal stenosis, lumbosacral Low back pain, > 6 wks Low back pain, no red flags  EXAM: MRI LUMBAR SPINE WITHOUT CONTRAST  TECHNIQUE: Multiplanar, multisequence MR imaging of the lumbar spine was performed. No intravenous contrast was administered.  COMPARISON: 09/27/2020 and prior.  FINDINGS: Segmentation: Standard.  Alignment: Straightening of lordosis. Trace grade 1 L3-4 anterolisthesis. Congenital spinal canal narrowing.  Vertebrae: Vertebral body heights are preserved. Modic type 2 endplate degenerative changes. No aggressive osseous lesion.  Conus medullaris and cauda equina: Conus extends to the L2 level. Conus and cauda equina appear normal.  Disc levels: Multilevel desiccation.  L1-2: No significant disc bulge. Prominent ligamentum flavum and facet degenerative spurring. Patent spinal canal and neural foramen.  L2-3: Disc bulge with left foraminal/extraforaminal protrusion with annular fissuring. Facet degenerative spurring. Mild spinal canal and bilateral neural foraminal narrowing.  L3-4: Disc bulge with superimposed bilateral foraminal protrusions. Prominent ligamentum flavum and bilateral facet hypertrophy. Moderate to severe  spinal canal narrowing. Severe right and mild left neural foraminal narrowing.  L4-5: Disc bulge, ligamentum flavum thickening and bilateral facet hypertrophy. Mild spinal canal and bilateral neural foraminal narrowing.  L5-S1: Disc bulge with inferiorly migrated right subarticular extrusion abutting the descending  S1 nerve root. Bilateral facet hypertrophy. Mild spinal canal narrowing. Moderate right and mild left neural foraminal narrowing.  Paraspinal and other soft tissues: Negative.  IMPRESSION: 1. Multilevel spondylosis. Moderate to severe spinal canal and right neural foraminal narrowing at the L3-4 level. 2. Moderate right L5-S1 neural foraminal narrowing. 3. Otherwise mild spinal canal/neural foraminal narrowing at the L2-S1 levels.   Electronically Signed By: Stana Bunting M.D. On: 10/17/2020 15:23  CLINICAL DATA: Neck pain, chronic, degenerative changes on xray Spinal stenosis, C-spine Cervical radiculopathy, no red flags  EXAM: MRI CERVICAL SPINE WITHOUT CONTRAST  TECHNIQUE: Multiplanar, multisequence MR imaging of the cervical spine was performed. No intravenous contrast was administered.  COMPARISON: 09/27/2020 and prior.  FINDINGS: Alignment: Straightening of lordosis.  Vertebrae: Mild bone marrow heterogeneity without focal lesion. Modic type 2 endplate degenerative changes. Vertebral body heights are preserved.  Cord: Normal signal and morphology.  Posterior Fossa, vertebral arteries: Negative.  Disc levels: Multilevel desiccation.  C2-3: Uncovertebral and facet hypertrophy. Moderate right neural foraminal narrowing. Patent spinal canal and left neural foramen.  C3-4: Disc osteophyte complex with uncovertebral and facet hypertrophy. Patent spinal canal. Mild right and severe left neural foraminal narrowing.  C4-5: Disc osteophyte complex partially effacing the CSF containing spaces. Uncovertebral and facet hypertrophy. Mild spinal canal and bilateral neural foraminal narrowing.  C5-6: Disc osteophyte complex with uncovertebral and facet hypertrophy. Mild spinal canal, mild right and moderate left neural foraminal narrowing.  C6-7: Disc osteophyte complex with left predominant uncovertebral and facet hypertrophy. Patent spinal canal and right  neural foramen. Severe left neural foraminal narrowing.  C7-T1: No significant disc bulge. Left predominant uncovertebral and facet hypertrophy. Patent spinal canal and right neural foramen. Mild left neural foraminal narrowing.  Paraspinal tissues: Negative.  IMPRESSION: Multilevel spondylosis. Mild C4-5, C5-6 spinal canal narrowing.  Severe left C3-4, moderate left C5-6 and severe left C6-7 neural foraminal narrowing.   Electronically Signed By: Stana Bunting M.D. On: 10/17/2020 15:13    Subjective: Chief Complaint  Patient presents with  . Neck - Follow-up    MRI Review  . Lower Back - Follow-up    MRI Review    65 year old male with history of cervicalgia and bilateral arm numbness. EMG/NCV with primarily carpal tunnel syndrome findings. He has pain in the neck with bending and turning the head and neck. Also with low back pain and difficulty standing and walking. He stopped working in August and has been unable to return to his job. He has had pain into the wrists and hands with numbness and paresthesias.  The left wrist pain and numbness is greater than the right. The results of MRI of the cervical spine demonstrate left siged  Spondylosis C5-6 and C6-7. Radiographs show DDD C4-5, C5-6 and C6-7. There is left foramenal narrowing above these levels at C3-4 . Lumbar MRI with severe stenosis at  L3-4 and an assymetric right L5-S1 disc protrusion with narrowing of the right subarticular area of the canal. No bowel or bladder difficulty. Unable to walk one mile. Able to walking To the mail box and back and to grocery shop.    Review of Systems  Constitutional: Negative.   HENT: Negative.   Eyes: Negative.   Respiratory: Negative.   Cardiovascular:  Negative.   Gastrointestinal: Negative.   Endocrine: Negative.   Genitourinary: Negative.   Musculoskeletal: Negative.   Skin: Negative.   Allergic/Immunologic: Negative.   Neurological: Negative.   Hematological:  Negative.   Psychiatric/Behavioral: Negative.      Objective: Vital Signs: There were no vitals taken for this visit.  Physical Exam Constitutional:      Appearance: He is well-developed and well-nourished.  HENT:     Head: Normocephalic and atraumatic.  Eyes:     Extraocular Movements: EOM normal.     Pupils: Pupils are equal, round, and reactive to light.  Pulmonary:     Effort: Pulmonary effort is normal.     Breath sounds: Normal breath sounds.  Abdominal:     General: Bowel sounds are normal.     Palpations: Abdomen is soft.  Musculoskeletal:     Cervical back: Normal range of motion and neck supple.     Lumbar back: Negative right straight leg raise test and negative left straight leg raise test.  Skin:    General: Skin is warm and dry.  Neurological:     Mental Status: He is alert and oriented to person, place, and time.  Psychiatric:        Mood and Affect: Mood and affect normal.        Behavior: Behavior normal.        Thought Content: Thought content normal.        Judgment: Judgment normal.     Back Exam   Tenderness  The patient is experiencing tenderness in the cervical.  Range of Motion  Flexion: abnormal  Lateral bend right: abnormal  Lateral bend left: abnormal  Rotation right: abnormal  Rotation left: abnormal   Muscle Strength  Right Quadriceps:  5/5  Left Quadriceps:  5/5  Right Hamstrings:  5/5  Left Hamstrings:  5/5   Tests  Straight leg raise right: negative Straight leg raise left: negative  Reflexes  Patellar: 1/4 Achilles: 1/4  Other  Toe walk: normal Heel walk: normal Sensation: decreased Erythema: no back redness Scars: absent  Comments:  Cervical spine ROM      Specialty Comments:  No specialty comments available.  Imaging: No results found.   PMFS History: Patient Active Problem List   Diagnosis Date Noted  . GERD (gastroesophageal reflux disease) 06/28/2020  . Hyperlipidemia 06/28/2020  .  Hypertension 06/28/2020  . Bilateral hand pain 06/28/2020  . Rheumatoid factor positive 06/28/2020  . Anxiety 12/09/2018   Past Medical History:  Diagnosis Date  . Anxiety   . Asthma    no inhaler  . Atopic dermatitis   . Bee sting   . BMI 29.0-29.9,adult   . Cervical radiculopathy   . Chronic insomnia   . Complaint of paresthesia   . Elevated blood pressure reading   . Epididymitis   . Family history of prostate cancer   . GERD (gastroesophageal reflux disease)   . Gout   . Hypertension   . Lateral epicondylitis of right elbow   . Pure hypercholesterolemia   . Seasonal allergic rhinitis due to pollen     Family History  Problem Relation Age of Onset  . Healthy Son   . Healthy Son   . Lupus Niece     Past Surgical History:  Procedure Laterality Date  . COLONOSCOPY  10/20/2017  . RADIOLOGY WITH ANESTHESIA N/A 10/17/2020   Procedure: MRI WITH ANESTHESIA  LUMBAR WITHOUT CONTRAST AND CERVICAL WITHOUT CONTRAST;  Surgeon: Radiologist, Medication, MD;  Location: [facility] OR;  Service: Radiology;  Laterality: N/A;  . UPPER GI ENDOSCOPY    . VASECTOMY     Per patient   Social History   Occupational History  . Not on file  Tobacco Use  . Smoking status: Former Smoker    Types: Cigarettes    Quit date: 05/16/2012    Years since quitting: 8.4  . Smokeless tobacco: Never Used  Vaping Use  . Vaping Use: Never used  Substance and Sexual Activity  . Alcohol use: Not Currently  . 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 Sexual activity: Yes

## 2020-10-25 NOTE — Patient Instructions (Signed)
We will contact your cardiologist and primary care physicians to seek clearance for your surgery. Surgery will be an anterior cervical discectomy and fusion at the C4-5, C5-6 and C6-7 levels with decompression of the cervical spinal canal at C4-5, C5-6 and C6-7 removal of bone pressing on the spinal cord and nerve roots.  An additional decompression, fusion is performed using allograft bone , vivigen and local bone grafting and plate and screws,  Risks of surgery include risks of infection, bleeding and risks to the spinal cord and  Risks of sore throat and difficulty swallowing which should  Improve over the next 4-6 weeks following surgery. Surgery is indicated due to upper extremity radiculopathy, lermittes phenomena and falls. In the future surgery at adjacent levels may be necessary but these levels do not appear to be related to your current symptoms or signs.

## 2020-11-22 ENCOUNTER — Encounter: Payer: Self-pay | Admitting: Specialist

## 2020-11-22 ENCOUNTER — Ambulatory Visit (INDEPENDENT_AMBULATORY_CARE_PROVIDER_SITE_OTHER): Payer: 59 | Admitting: Specialist

## 2020-11-22 VITALS — BP 133/84 | HR 60 | Ht 67.0 in | Wt 176.0 lb

## 2020-11-22 DIAGNOSIS — R202 Paresthesia of skin: Secondary | ICD-10-CM

## 2020-11-22 DIAGNOSIS — M5442 Lumbago with sciatica, left side: Secondary | ICD-10-CM

## 2020-11-22 DIAGNOSIS — M4802 Spinal stenosis, cervical region: Secondary | ICD-10-CM

## 2020-11-22 DIAGNOSIS — M5412 Radiculopathy, cervical region: Secondary | ICD-10-CM

## 2020-11-22 DIAGNOSIS — G5602 Carpal tunnel syndrome, left upper limb: Secondary | ICD-10-CM

## 2020-11-22 DIAGNOSIS — G8929 Other chronic pain: Secondary | ICD-10-CM

## 2020-11-22 DIAGNOSIS — G5601 Carpal tunnel syndrome, right upper limb: Secondary | ICD-10-CM | POA: Diagnosis not present

## 2020-11-22 DIAGNOSIS — M5441 Lumbago with sciatica, right side: Secondary | ICD-10-CM

## 2020-11-22 NOTE — Patient Instructions (Addendum)
  Plan:Avoid overhead lifting and overhead use of the arms. Do not lift greater than 5 lbs. Adjust head rest in vehicle to prevent hyperextension if rear ended. Take extra precautions to avoid falling, including use of a cane if you feel weak. Scheduling secretary Tivis Ringer. will call you to arrange for surgery for your cervical spine. If you wish a second opinion please let us know and we can arrange for you. If you have worsening arm or leg numbness or weakness please call or go to an ER. We will contact your cardiologist and primary care physicians to seek clearance for your surgery. Surgery will be an anterior cervical discectomy and fusion at the C4-5, C5-6 and C6-7 levels with decompression of the cervical spinal canal at C4-5, C5-6 and C6-7 removal of bone pressing on the spinal cord and nerve roots.  An additional decompression, fusion is performed using allograft bone , vivigen and local bone grafting and plate and screws, Due to left carpal tunnel syndrome and a double crush affect of both cervical and distal nerve compression the left carpal tunnel will undergo open release at the same surgery.  Risks of surgery include risks of infection, bleeding and risks to the spinal cord and  Risks of sore throat and difficulty swallowing which should  Improve over the next 4-6 weeks following surgery. Surgery is indicated due to upper extremity radiculopathy, lermittes phenomena and falls. In the future surgery at adjacent levels may be necessary but these levels do not appear to be related to your current symptoms or signs..     Follow-Up Instructions: No follow-ups on file.   Orders:  No orders of the defined types were placed in this encounter.

## 2020-11-22 NOTE — Progress Notes (Addendum)
Office Visit Note   Patient: David Graves           Date of Birth: [birth year]           MRN: [id number] Visit Date: 11/22/2020              Requested by: Mila Palmer, MD [address] [place],  [place] [address] PCP: Mila Palmer, MD   Assessment & Plan: Visit Diagnoses:  1. Right carpal tunnel syndrome   2. Chronic bilateral low back pain with bilateral sciatica   3. Spinal stenosis of cervical region   4. Radiculopathy of cervical spine   5. Paresthesias in right hand   6. Paresthesias in left hand   7. Left carpal tunnel syndrome      Plan:Avoid overhead lifting and overhead use of the arms. Do not lift greater than 5 lbs. Adjust head rest in vehicle to prevent hyperextension if rear ended. Take extra precautions to avoid falling, including use of a cane if you feel weak. Scheduling secretary Tivis Ringer. will call you to arrange for surgery for your cervical spine. If you wish a second opinion please let us know and we can arrange for you. If you have worsening arm or leg numbness or weakness please call or go to an ER. We will contact your cardiologist and primary care physicians to seek clearance for your surgery. Surgery will be an anterior cervical discectomy and fusion at the C4-5, C5-6 and C6-7 levels with decompression of the cervical spinal canal at C4-5, C5-6 and C6-7 removal of bone pressing on the spinal cord and nerve roots.  An additional decompression, fusion is performed using allograft bone , vivigen and local bone grafting and plate and screws, Due to left carpal tunnel syndrome and a double crush affect of both cervical and distal nerve compression the left carpal tunnel will undergo open release at the same surgery.  Risks of surgery include risks of infection, bleeding and risks to the spinal cord and  Risks of sore throat and difficulty swallowing which should  Improve over the next 4-6 weeks following surgery. Surgery is  indicated due to upper extremity radiculopathy, lermittes phenomena and falls. In the future surgery at adjacent levels may be necessary but these levels do not appear to be related to your current symptoms or signs.     Follow-Up Instructions: No follow-ups on file.   Orders:  No orders of the defined types were placed in this encounter.  Follow-Up Instructions: No follow-ups on file.   Orders:  No orders of the defined types were placed in this encounter.  No orders of the defined types were placed in this encounter.     Procedures: No procedures performed   Clinical Data: No additional findings.   Subjective: Chief Complaint  Patient presents with  . Neck - Follow-up    65 year old right handed male with bilateral arm pain numbness and tingling. He is having worsening pain into the right medial scapula and along the right medial arm and  Right lateral chest wall. The skin is painful. He is able to walk well, He is not dropping items. Has some difficulty with the buttons on his shirt sometimes. No bowel or bladder difficulty.   Review of Systems  Constitutional: Negative.   HENT: Negative.   Eyes: Negative.   Respiratory: Negative.   Cardiovascular: Negative.   Gastrointestinal: Negative.   Endocrine: Negative.   Genitourinary: Negative.   Musculoskeletal: Negative.  Skin: Negative.   Allergic/Immunologic: Negative.   Neurological: Negative.   Hematological: Negative.   Psychiatric/Behavioral: Negative.      Objective: Vital Signs: BP 133/84 (BP Location: Left Arm, Patient Position: Sitting)   Pulse 60   Ht 5\' 7"  (1.702 m)   Wt 176 lb (79.8 kg)   BMI 27.57 kg/m   Physical Exam Constitutional:      Appearance: He is well-developed.  HENT:     Head: Normocephalic and atraumatic.  Eyes:     Pupils: Pupils are equal, round, and reactive to light.  Pulmonary:     Effort: Pulmonary effort is normal.     Breath sounds: Normal breath sounds.   Abdominal:     General: Bowel sounds are normal.     Palpations: Abdomen is soft.  Musculoskeletal:        General: Normal range of motion.     Cervical back: Normal range of motion and neck supple.  Skin:    General: Skin is warm and dry.  Neurological:     Mental Status: He is alert and oriented to person, place, and time.  Psychiatric:        Behavior: Behavior normal.        Thought Content: Thought content normal.        Judgment: Judgment normal.     Back Exam   Tenderness  The patient is experiencing tenderness in the cervical.  Muscle Strength  Right Quadriceps:  5/5  Left Quadriceps:  5/5  Right Hamstrings:  5/5  Left Hamstrings:  5/5       Specialty Comments:  No specialty comments available.  Imaging: No results found.   PMFS History: Patient Active Problem List   Diagnosis Date Noted  . GERD (gastroesophageal reflux disease) 06/28/2020  . Hyperlipidemia 06/28/2020  . Hypertension 06/28/2020  . Bilateral hand pain 06/28/2020  . Rheumatoid factor positive 06/28/2020  . Anxiety 12/09/2018   Past Medical History:  Diagnosis Date  . Anxiety   . Asthma    no inhaler  . Atopic dermatitis   . Bee sting   . BMI 29.0-29.9,adult   . Cervical radiculopathy   . Chronic insomnia   . Complaint of paresthesia   . Elevated blood pressure reading   . Epididymitis   . Family history of prostate cancer   . GERD (gastroesophageal reflux disease)   . Gout   . Hypertension   . Lateral epicondylitis of right elbow   . Pure hypercholesterolemia   . Seasonal allergic rhinitis due to pollen     Family History  Problem Relation Age of Onset  . Healthy Son   . Healthy Son   . Lupus Niece     Past Surgical History:  Procedure Laterality Date  . COLONOSCOPY  10/20/2017  . RADIOLOGY WITH ANESTHESIA N/A 10/17/2020   Procedure: MRI WITH ANESTHESIA  LUMBAR WITHOUT CONTRAST AND CERVICAL WITHOUT CONTRAST;  Surgeon: Radiologist, Medication, MD;  Location: [facility] OR;   Service: Radiology;  Laterality: N/A;  . UPPER GI ENDOSCOPY    . VASECTOMY     Per patient   Social History   Occupational History  . Not on file  Tobacco Use  . Smoking status: Former Smoker    Types: Cigarettes    Quit date: 05/16/2012    Years since quitting: 8.5  . Smokeless tobacco: Never Used  Vaping Use  . Vaping Use: Never used  Substance and Sexual Activity  . Alcohol use: Not Currently  . 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 Sexual activity: Yes

## 2021-02-15 ENCOUNTER — Telehealth: Payer: Self-pay | Admitting: Specialist

## 2021-02-15 NOTE — Telephone Encounter (Signed)
Pt called stating he discussed having surgery with Dr. Otelia Sergeant but then he never heard anything again. Pt is wanting to know if that surgery was ever scheduled and he's just waiting for the time to get closer? Or does he need to be seen in office again before it can be scheduled?  [phone]

## 2021-02-15 NOTE — Telephone Encounter (Signed)
Started blue sheet and gave to Dr. Otelia Sergeant to complete.

## 2021-02-16 NOTE — Telephone Encounter (Signed)
David Graves has the surgery sheet

## 2021-02-16 NOTE — Telephone Encounter (Signed)
I called patient and left voice mail that he needs office visit with Dr. Otelia Sergeant.  I had called patient in April and left a message to schedule surgery and never received a return call.  I do have a surgery sheet.

## 2021-02-21 ENCOUNTER — Ambulatory Visit: Payer: 59 | Admitting: Specialist

## 2021-02-22 ENCOUNTER — Ambulatory Visit: Payer: Self-pay

## 2021-02-22 ENCOUNTER — Encounter: Payer: Self-pay | Admitting: Specialist

## 2021-02-22 ENCOUNTER — Ambulatory Visit (INDEPENDENT_AMBULATORY_CARE_PROVIDER_SITE_OTHER): Payer: 59 | Admitting: Specialist

## 2021-02-22 VITALS — BP 151/76 | HR 58 | Ht 67.0 in | Wt 176.0 lb

## 2021-02-22 DIAGNOSIS — M4802 Spinal stenosis, cervical region: Secondary | ICD-10-CM

## 2021-02-22 DIAGNOSIS — M2241 Chondromalacia patellae, right knee: Secondary | ICD-10-CM | POA: Diagnosis not present

## 2021-02-22 DIAGNOSIS — R202 Paresthesia of skin: Secondary | ICD-10-CM | POA: Diagnosis not present

## 2021-02-22 DIAGNOSIS — M25561 Pain in right knee: Secondary | ICD-10-CM

## 2021-02-22 MED ORDER — DICLOFENAC SODIUM 1 % EX GEL: 4.0000 g | Freq: Four times a day (QID) | CUTANEOUS | 3 refills | Status: DC

## 2021-02-22 NOTE — Progress Notes (Signed)
Office Visit Note   Patient: Codylee Patil           Date of Birth: [birth year]           MRN: [id number] Visit Date: 02/22/2021              Requested by: Mila Palmer, MD [address] [place],  [place] [address] PCP: Mila Palmer, MD   Assessment & Plan: Visit Diagnoses:  1. Right knee pain, unspecified chronicity   2. Spinal stenosis of cervical region   3. Paresthesias in right hand   4. Paresthesias in left hand   5. Chondromalacia patellae, right knee     Plan: Plan:Avoid overhead lifting and overhead use of the arms. Do not lift greater than 5 lbs. Adjust head rest in vehicle to prevent hyperextension if rear ended. Take extra precautions to avoid falling, including use of a cane if you feel weak. Scheduling secretary Tivis Ringer. will call you to arrange for surgery for your cervical spine. If you wish a second opinion please let us know and we can arrange for you. If you have worsening arm or leg numbness or weakness please call or go to an ER. We will contact your cardiologist and primary care physicians to seek clearance for your surgery. Surgery will be an anterior cervical discectomy and fusion at the C4-5, C5-6 and C6-7 levels with decompression of the cervical spinal canal at C4-5, C5-6 and C6-7 removal of bone pressing on the spinal cord and nerve roots.  An additional decompression, fusion is performed using allograft bone , vivigen and local bone grafting and plate and screws, Risks of surgery include risks of infection, bleeding and risks to the spinal cord and Risks of sore throat and difficulty swallowing which should  Improve over the next 4-6 weeks following surgery. Surgery is indicated due to upper extremity radiculopathy, lermittes phenomena and falls. In the future surgery at adjacent levels may be necessary but these levels do not appear to be related to your current symptoms or signs.  Plan: Avoid overhead lifting and  overhead use of the arms. Do not lift greater than 15-25 lbs. Adjust head rest in vehicle to prevent hyperextension if rear ended. Take extra precautions to avoid falling.  Avoid overhead lifting and overhead use of the arms. Pillows to keep from sleeping directly on the shoulders  Ice or heat for relief. NSAIDs are helpful, such as alleve or motrin, be careful not to use in excess as they place burdens on the kidney. Stretching exercise help and strengthening is helpful to build endurance.  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 Well padded shoes help. Ice the knee that is suffering from osteoarthritis, only real proven treatments are Weight loss, NSIADs like diclofenac gel and exercise. Well padded shoes help. Ice the knee 2-3 times a day 15-20 mins at a time.-3 times a day 15-20 mins at a time. Hot showers in the AM.  Injection with steroid may be of benefit.Hemp CBD capsules, amazon.com 5,000-7,000 mg per bottle, 60 capsules per bottle, take one capsule twice a day.  Patellofemoral Pain Syndrome  Patellofemoral pain syndrome is a condition in which the tissue (cartilage) on the underside of the kneecap (patella) softens or breaks down. This causes pain in the front of the knee. The condition is also called runner's knee or chondromalacia patella. Patellofemoral pain syndrome is most common  in young adults who are active insports. The knee is the largest joint in the body. The patella covers the front of the knee and is attached to muscles above and below the knee. The underside of the patella is covered with a smooth type of cartilage (synovium). The smooth surface helps the patella glide easily when you move your knee. Patellofemoral pain syndrome causes swelling in the joint linings and bonesurfaces in the knee. What are the causes? This condition may be caused by: Overuse of  the knee. Poor alignment of your knee joints. Weak leg muscles. A direct hit to your kneecap. What increases the risk? You are more likely to develop this condition if: You do a lot of activities that can wear down your kneecap. These include: Running. Squatting. Climbing stairs. You start a new physical activity or exercise program. You wear shoes that do not fit well. You do not have good leg strength. You are overweight. What are the signs or symptoms? The main symptom of this condition is knee pain. This may feel like a dull, aching pain underneath your patella, in the front of your knee. There may be a popping or cracking sound when you move your knee. Pain may get worse with: Exercise. Climbing stairs. Running. Jumping. Squatting. Kneeling. Sitting for a long time. Moving or pushing on your patella. How is this diagnosed? This condition may be diagnosed based on: Your symptoms and medical history. You may be asked about your recent physical activities and which ones cause knee pain. A physical exam. This may include: Moving your patella back and forth. Checking your range of knee motion. Having you squat or jump to see if you have pain. Checking the strength of your leg muscles. Imaging tests to confirm the diagnosis. These may include an MRI of your knee. How is this treated? This condition may be treated at home with rest, ice, compression, andelevation (RICE).  Other treatments may include: NSAIDs, such as ibuprofen. Physical therapy to stretch and strengthen your leg muscles. Shoe inserts (orthotics) to take stress off your knee. A knee brace or knee support. Adhesive tapes to the skin. Surgery to remove damaged cartilage or move the patella to a better position. This is rare. Follow these instructions at home: If you have a brace: Wear the brace as told by your health care provider. Remove it only as told by your health care provider. Loosen the brace if your  toes tingle, become numb, or turn cold and blue. Keep the brace clean. If the brace is not waterproof: Do not let it get wet. Cover it with a watertight covering when you take a bath or a shower. Managing pain, stiffness, and swelling  If directed, put ice on the painful area. To do this: If you have a removable brace, remove it as told by your health care provider. Put ice in a plastic bag. Place a towel between your skin and the bag. Leave the ice on for 20 minutes, 2-3 times a day. Remove the ice if your skin turns bright red. This is very important. If you cannot feel pain, heat, or cold, you have a greater risk of damage to the area. Move your toes often to reduce stiffness and swelling. Raise (elevate) the injured area above the level of your heart while you are sitting or lying down.  Activity Rest your knee. Avoid activities that cause knee pain. Perform stretching and strengthening exercises as told by your health care provider or physical therapist.  Return to your normal activities as told by your health care provider. Ask your health care provider what activities are safe for you. General instructions Take over-the-counter and prescription medicines only as told by your health care provider. Use splints, braces, knee supports, or walking aids as directed by your health care provider. Do not use any products that contain nicotine or tobacco, such as cigarettes, e-cigarettes, and chewing tobacco. These can delay healing. If you need help quitting, ask your health care provider. Keep all follow-up visits. This is important. Contact a health care provider if: Your symptoms get worse. You are not improving with home care. Summary Patellofemoral pain syndrome is a condition in which the tissue (cartilage) on the underside of the kneecap (patella) softens or breaks down. This condition causes swelling in the joint linings and bone surfaces in the knee. This leads to pain in the  front of the knee. This condition may be treated at home with rest, ice, compression, and elevation (RICE). Use splints, braces, knee supports, or walking aids as directed by your health care provider. This information is not intended to replace advice given to you by your health care provider. Make sure you discuss any questions you have with your healthcare provider. Document Revised: 01/26/2020 Document Reviewed: 01/26/2020 Elsevier Patient Education  2022 [employer]  Lateral Patellar Compression Syndrome Rehab Ask your health care provider which exercises are safe for you. Do exercises exactly as told by your health care provider and adjust them as directed. It is normal to feel mild stretching, pulling, tightness, or discomfort as you do these exercises. Stop right away if you feel sudden pain or your pain gets worse. Do not begin these exercises until told by your health care provider. Stretching and range-of-motion exercises These exercises warm up your muscles and joints and improve the movement andflexibility of your knee. These exercises also help to relieve pain. Medial patellar mobilization, seated This is an exercise in which you push your kneecap (patella) inward (medialmobilization) while seated. Sit with your left / right knee out in front of you with knee extended. You can put a rolled towel or small pillow under your knee for support. Place your left / right palm on the outside of your knee. Place your index finger and thumb around your kneecap. Your thumb will be above your kneecap and your index finger will be below it. Use your hand to gently slide your kneecap toward the inside of your knee. Keep your leg relaxed. Use your other hand to keep your knee steady. You should feel a slight stretch on the outside edge of your kneecap. Hold this position for __________ seconds. Hamstring, standing  Stand with your left / right leg fully extended and your heel resting on a  chair. Arch your lower back slightly. Lean forward at the waist, leading with your chest, until you feel a gentle stretch in the back of your left / right knee or thigh (hamstring). You should not need to lean far to feel the stretch. Hold this position for __________ seconds. Repeat __________ times. Complete this exercise __________ times a day. Iliotibial band stretch An iliotibial band is a strong band of muscle tissue that runs from the outer side of your hip to the outer side of your thigh and knee. Lie on your side with your left / right leg on top. Bend both of your knees and grab your left / right ankle. Stretch out your bottom arm to help you balance. Slowly  bring your top knee back so your thigh is behind your body. Slowly lower your knee toward the floor until you feel a gentle stretch on the outside of your left / right hip and thigh. If you do not feel a stretch and your knee will not fall farther, place the heel of your other foot on top of your outer knee and use it to pull your thigh down toward the floor. Hold this position for __________ seconds. Slowly return to the starting position. Repeat __________ times. Complete this exercise __________ times a day. Quadriceps stretch, prone  Lie on your abdomen (prone position) on a firm surface, such as a bed or padded floor. Bend your left/right knee and reach back to hold your ankle or pant leg. If you cannot reach your ankle or pant leg, loop a belt around your foot and grab the belt instead. Gently pull your heel toward your buttocks. Your knee should not slide out to the side. You should feel a stretch in the front of your thigh and knee (quadriceps). Hold this position for __________ seconds. Repeat ________ times. Complete this exercise _________ times a day.  Strengthening exercises These exercises build strength and endurance in your knee. Endurance is theability to use your muscles for a long time, even after they get  tired. Quadriceps, isometric This exercise stretches the muscles in the front of your thigh (quadriceps) without moving your knee joint (isometric). Lie on your back with your left / right leg extended and your other knee bent. You may put a rolled towel or small pillow under your knee. Slowly tense the muscles in the front of your left / right thigh. You should see your kneecap slide up toward your hip or see increased dimpling just above the knee. This motion will push the back of the knee toward the floor. For __________ seconds, hold the muscle as tight as you can without increasing your pain. Relax the muscles slowly and completely after each repetition. Repeat __________ times. Complete this exercise __________ times a day. Straight leg raises, supine This exercise stretches the muscles that pull your leg or knee upward toward your torso (hip flexors). Lie on your back (supine position) with your left / right leg extended and your other knee bent. Tense the muscles in the front of your left / right thigh. You should see your kneecap slide up or see increased dimpling just above the knee. Keep these muscles tight as you raise your leg 4-6 inches (10-15 cm) off the floor. Hold this position for __________ seconds. Keep these muscles tense as you lower your leg. Relax your muscles slowly and completely after each repetition. Repeat __________ times. Complete this exercise __________ times a day. Wall slides This exercise strengthens the muscles in the front and back of your thigh and the muscles in your shin (hip and knee extensors). Lean your back against a smooth wall or door, and walk your feet out 18-24 inches (46-61 cm) from it. Place your feet hip-width apart. Slowly slide down the wall or door until your knees bend __________ degrees. Keep your knees over your heels, not over your toes. Keep your knees in line with your hips. Hold this position for __________ seconds. Use the muscles  in the front of your thigh to push yourself up to the standing position. Rest for __________ seconds after each repetition. Repeat __________ times. Complete this exercise __________ times a day. This information is not intended to replace advice given to you  by your health care provider. Make sure you discuss any questions you have with your healthcare provider. Document Revised: 12/04/2018 Document Reviewed: 06/23/2018 Elsevier Patient Education  2022 [employer] Inc.        Follow-Up Instructions: No follow-ups on file.    Orders:  No orders of the defined types were placed in this encounter.   Follow-Up Instructions: No follow-ups on file.     Follow-Up Instructions: No follow-ups on file.   Orders:  Orders Placed This Encounter  Procedures   XR Knee 1-2 Views Right   No orders of the defined types were placed in this encounter.     Procedures: No procedures performed   Clinical Data: No additional findings.   Subjective: Chief Complaint  Patient presents with   Neck - Follow-up   Left Hand - Follow-up   Right Knee - Pain    Pain and swelling    65 year old male with history of right arm numbness and tingling into the whole right hand. Underwent EMG/NCV by    Review of Systems  Constitutional: Negative.   HENT: Negative.    Eyes: Negative.   Respiratory: Negative.    Cardiovascular: Negative.   Gastrointestinal: Negative.   Endocrine: Negative.   Genitourinary: Negative.   Musculoskeletal: Negative.   Skin: Negative.   Allergic/Immunologic: Negative.   Neurological: Negative.   Hematological: Negative.   Psychiatric/Behavioral: Negative.      Objective: Vital Signs: BP (!) 151/76 (BP Location: Left Arm, Patient Position: Sitting)   Pulse (!) 58   Ht 5\' 7"  (1.702 m)   Wt 176 lb (79.8 kg)   BMI 27.57 kg/m   Physical Exam Constitutional:      Appearance: He is well-developed.  HENT:     Head: Normocephalic and atraumatic.  Eyes:      Pupils: Pupils are equal, round, and reactive to light.  Pulmonary:     Effort: Pulmonary effort is normal.     Breath sounds: Normal breath sounds.  Abdominal:     General: Bowel sounds are normal.     Palpations: Abdomen is soft.  Musculoskeletal:     Cervical back: Normal range of motion and neck supple.     Lumbar back: Negative right straight leg raise test and negative left straight leg raise test.  Skin:    General: Skin is warm and dry.  Neurological:     Mental Status: He is alert and oriented to person, place, and time.  Psychiatric:        Behavior: Behavior normal.        Thought Content: Thought content normal.        Judgment: Judgment normal.    Back Exam   Tenderness  The patient is experiencing tenderness in the cervical.  Range of Motion  Extension:  abnormal  Flexion:  abnormal  Lateral bend right:  abnormal  Lateral bend left:  abnormal  Rotation right:  abnormal  Rotation left:  abnormal   Muscle Strength  Right Quadriceps:  5/5  Left Quadriceps:  5/5  Right Hamstrings:  5/5  Left Hamstrings:  5/5   Tests  Straight leg raise right: negative Straight leg raise left: negative  Other  Toe walk: normal Heel walk: normal Erythema: no back redness Scars: absent     Specialty Comments:  No specialty comments available.  Imaging: No results found.   PMFS History: Patient Active Problem List   Diagnosis Date Noted   GERD (gastroesophageal reflux disease) 06/28/2020  Hyperlipidemia 06/28/2020   Hypertension 06/28/2020   Bilateral hand pain 06/28/2020   Rheumatoid factor positive 06/28/2020   Anxiety 12/09/2018   Past Medical History:  Diagnosis Date   Anxiety    Asthma    no inhaler   Atopic dermatitis    Bee sting    BMI 29.0-29.9,adult    Cervical radiculopathy    Chronic insomnia    Complaint of paresthesia    Elevated blood pressure reading    Epididymitis    Family history of prostate cancer    GERD (gastroesophageal  reflux disease)    Gout    Hypertension    Lateral epicondylitis of right elbow    Pure hypercholesterolemia    Seasonal allergic rhinitis due to pollen     Family History  Problem Relation Age of Onset   Healthy Son    Healthy Son    Lupus Niece     Past Surgical History:  Procedure Laterality Date   COLONOSCOPY  10/20/2017   RADIOLOGY WITH ANESTHESIA N/A 10/17/2020   Procedure: MRI WITH ANESTHESIA  LUMBAR WITHOUT CONTRAST AND CERVICAL WITHOUT CONTRAST;  Surgeon: Radiologist, Medication, MD;  Location: [facility] OR;  Service: Radiology;  Laterality: N/A;   UPPER GI ENDOSCOPY     VASECTOMY     Per patient   Social History   Occupational History   Not on file  Tobacco Use   Smoking status: Former    Pack years: 0.00    Types: Cigarettes    Quit date: 05/16/2012    Years since quitting: 8.7   Smokeless tobacco: Never  Vaping Use   Vaping Use: Never used  Substance and Sexual Activity   Alcohol use: Not Currently   Drug use: Yes    Frequency: 4.0 times per week    Types: 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

## 2021-02-22 NOTE — Patient Instructions (Addendum)
Plan:Avoid overhead lifting and overhead use of the arms. Do not lift greater than 5 lbs. Adjust head rest in vehicle to prevent hyperextension if rear ended. Take extra precautions to avoid falling, including use of a cane if you feel weak. Scheduling secretary Tivis Ringer. will call you to arrange for surgery for your cervical spine. If you wish a second opinion please let us know and we can arrange for you. If you have worsening arm or leg numbness or weakness please call or go to an ER. We will contact your cardiologist and primary care physicians to seek clearance for your surgery. Surgery will be an anterior cervical discectomy and fusion at the C4-5, C5-6 and C6-7 levels with decompression of the cervical spinal canal at C4-5, C5-6 and C6-7 removal of bone pressing on the spinal cord and nerve roots.  An additional decompression, fusion is performed using allograft bone , vivigen and local bone grafting and plate and screws, Due to left carpal tunnel syndrome and a double crush affect of both cervical and distal nerve compression the left carpal tunnel will undergo open release at the same surgery.  Risks of surgery include risks of infection, bleeding and risks to the spinal cord and Risks of sore throat and difficulty swallowing which should  Improve over the next 4-6 weeks following surgery. Surgery is indicated due to upper extremity radiculopathy, lermittes phenomena and falls. In the future surgery at adjacent levels may be necessary but these levels do not appear to be related to your current symptoms or signs.   Avoid overhead lifting and overhead use of the arms. Do not lift greater than 15-25 lbs. Adjust head rest in vehicle to prevent hyperextension if rear ended. Take extra precautions to avoid falling.  Avoid overhead lifting and overhead use of the arms. Pillows to keep from sleeping directly on the shoulders  Ice or heat for relief. NSAIDs are helpful, such as alleve  or motrin, be careful not to use in excess as they place burdens on the kidney. Stretching exercise help and strengthening is helpful to build endurance.  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 Well padded shoes help. Ice the knee that is suffering from osteoarthritis, only real proven treatments are Weight loss, NSIADs like diclofenac gel and exercise. Well padded shoes help. Ice the knee 2-3 times a day 15-20 mins at a time.-3 times a day 15-20 mins at a time. Hot showers in the AM.  Injection with steroid may be of benefit.Hemp CBD capsules, amazon.com 5,000-7,000 mg per bottle, 60 capsules per bottle, take one capsule twice a day.  Patellofemoral Pain Syndrome  Patellofemoral pain syndrome is a condition in which the tissue (cartilage) on the underside of the kneecap (patella) softens or breaks down. This causes pain in the front of the knee. The condition is also called runner's knee or chondromalacia patella. Patellofemoral pain syndrome is most common in young adults who are active insports. The knee is the largest joint in the body. The patella covers the front of the knee and is attached to muscles above and below the knee. The underside of the patella is covered with a smooth type of cartilage (synovium). The smooth surface helps the patella glide easily when you move your knee. Patellofemoral pain syndrome causes swelling in the joint linings and bonesurfaces in the knee. What are the causes? This condition may be caused by: Overuse of  the knee. Poor alignment of your knee joints. Weak leg muscles. A direct hit to your kneecap. What increases the risk? You are more likely to develop this condition if: You do a lot of activities that can wear down your kneecap. These include: Running. Squatting. Climbing stairs. You start a new physical activity or exercise program. You  wear shoes that do not fit well. You do not have good leg strength. You are overweight. What are the signs or symptoms? The main symptom of this condition is knee pain. This may feel like a dull, aching pain underneath your patella, in the front of your knee. There may be a popping or cracking sound when you move your knee. Pain may get worse with: Exercise. Climbing stairs. Running. Jumping. Squatting. Kneeling. Sitting for a long time. Moving or pushing on your patella. How is this diagnosed? This condition may be diagnosed based on: Your symptoms and medical history. You may be asked about your recent physical activities and which ones cause knee pain. A physical exam. This may include: Moving your patella back and forth. Checking your range of knee motion. Having you squat or jump to see if you have pain. Checking the strength of your leg muscles. Imaging tests to confirm the diagnosis. These may include an MRI of your knee. How is this treated? This condition may be treated at home with rest, ice, compression, andelevation (RICE).  Other treatments may include: NSAIDs, such as ibuprofen. Physical therapy to stretch and strengthen your leg muscles. Shoe inserts (orthotics) to take stress off your knee. A knee brace or knee support. Adhesive tapes to the skin. Surgery to remove damaged cartilage or move the patella to a better position. This is rare. Follow these instructions at home: If you have a brace: Wear the brace as told by your health care provider. Remove it only as told by your health care provider. Loosen the brace if your toes tingle, become numb, or turn cold and blue. Keep the brace clean. If the brace is not waterproof: Do not let it get wet. Cover it with a watertight covering when you take a bath or a shower. Managing pain, stiffness, and swelling  If directed, put ice on the painful area. To do this: If you have a removable brace, remove it as told by  your health care provider. Put ice in a plastic bag. Place a towel between your skin and the bag. Leave the ice on for 20 minutes, 2-3 times a day. Remove the ice if your skin turns bright red. This is very important. If you cannot feel pain, heat, or cold, you have a greater risk of damage to the area. Move your toes often to reduce stiffness and swelling. Raise (elevate) the injured area above the level of your heart while you are sitting or lying down.  Activity Rest your knee. Avoid activities that cause knee pain. Perform stretching and strengthening exercises as told by your health care provider or physical therapist. Return to your normal activities as told by your health care provider. Ask your health care provider what activities are safe for you. General instructions Take over-the-counter and prescription medicines only as told by your health care provider. Use splints, braces, knee supports, or walking aids as directed by your health care provider. Do not use any products that contain nicotine or tobacco, such as cigarettes, e-cigarettes, and chewing tobacco. These can delay healing. If you need help quitting, ask your health care provider. Keep all follow-up  visits. This is important. Contact a health care provider if: Your symptoms get worse. You are not improving with home care. Summary Patellofemoral pain syndrome is a condition in which the tissue (cartilage) on the underside of the kneecap (patella) softens or breaks down. This condition causes swelling in the joint linings and bone surfaces in the knee. This leads to pain in the front of the knee. This condition may be treated at home with rest, ice, compression, and elevation (RICE). Use splints, braces, knee supports, or walking aids as directed by your health care provider. This information is not intended to replace advice given to you by your health care provider. Make sure you discuss any questions you have with your  healthcare provider. Document Revised: 01/26/2020 Document Reviewed: 01/26/2020 Elsevier Patient Education  2022 [employer].  Lateral Patellar Compression Syndrome Rehab Ask your health care provider which exercises are safe for you. Do exercises exactly as told by your health care provider and adjust them as directed. It is normal to feel mild stretching, pulling, tightness, or discomfort as you do these exercises. Stop right away if you feel sudden pain or your pain gets worse. Do not begin these exercises until told by your health care provider. Stretching and range-of-motion exercises These exercises warm up your muscles and joints and improve the movement andflexibility of your knee. These exercises also help to relieve pain. Medial patellar mobilization, seated This is an exercise in which you push your kneecap (patella) inward (medialmobilization) while seated. Sit with your left / right knee out in front of you with knee extended. You can put a rolled towel or small pillow under your knee for support. Place your left / right palm on the outside of your knee. Place your index finger and thumb around your kneecap. Your thumb will be above your kneecap and your index finger will be below it. Use your hand to gently slide your kneecap toward the inside of your knee. Keep your leg relaxed. Use your other hand to keep your knee steady. You should feel a slight stretch on the outside edge of your kneecap. Hold this position for __________ seconds. Hamstring, standing  Stand with your left / right leg fully extended and your heel resting on a chair. Arch your lower back slightly. Lean forward at the waist, leading with your chest, until you feel a gentle stretch in the back of your left / right knee or thigh (hamstring). You should not need to lean far to feel the stretch. Hold this position for __________ seconds. Repeat __________ times. Complete this exercise __________ times a  day. Iliotibial band stretch An iliotibial band is a strong band of muscle tissue that runs from the outer side of your hip to the outer side of your thigh and knee. Lie on your side with your left / right leg on top. Bend both of your knees and grab your left / right ankle. Stretch out your bottom arm to help you balance. Slowly bring your top knee back so your thigh is behind your body. Slowly lower your knee toward the floor until you feel a gentle stretch on the outside of your left / right hip and thigh. If you do not feel a stretch and your knee will not fall farther, place the heel of your other foot on top of your outer knee and use it to pull your thigh down toward the floor. Hold this position for __________ seconds. Slowly return to the starting position. Repeat  __________ times. Complete this exercise __________ times a day. Quadriceps stretch, prone  Lie on your abdomen (prone position) on a firm surface, such as a bed or padded floor. Bend your left/right knee and reach back to hold your ankle or pant leg. If you cannot reach your ankle or pant leg, loop a belt around your foot and grab the belt instead. Gently pull your heel toward your buttocks. Your knee should not slide out to the side. You should feel a stretch in the front of your thigh and knee (quadriceps). Hold this position for __________ seconds. Repeat ________ times. Complete this exercise _________ times a day.  Strengthening exercises These exercises build strength and endurance in your knee. Endurance is theability to use your muscles for a long time, even after they get tired. Quadriceps, isometric This exercise stretches the muscles in the front of your thigh (quadriceps) without moving your knee joint (isometric). Lie on your back with your left / right leg extended and your other knee bent. You may put a rolled towel or small pillow under your knee. Slowly tense the muscles in the front of your left / right  thigh. You should see your kneecap slide up toward your hip or see increased dimpling just above the knee. This motion will push the back of the knee toward the floor. For __________ seconds, hold the muscle as tight as you can without increasing your pain. Relax the muscles slowly and completely after each repetition. Repeat __________ times. Complete this exercise __________ times a day. Straight leg raises, supine This exercise stretches the muscles that pull your leg or knee upward toward your torso (hip flexors). Lie on your back (supine position) with your left / right leg extended and your other knee bent. Tense the muscles in the front of your left / right thigh. You should see your kneecap slide up or see increased dimpling just above the knee. Keep these muscles tight as you raise your leg 4-6 inches (10-15 cm) off the floor. Hold this position for __________ seconds. Keep these muscles tense as you lower your leg. Relax your muscles slowly and completely after each repetition. Repeat __________ times. Complete this exercise __________ times a day. Wall slides This exercise strengthens the muscles in the front and back of your thigh and the muscles in your shin (hip and knee extensors). Lean your back against a smooth wall or door, and walk your feet out 18-24 inches (46-61 cm) from it. Place your feet hip-width apart. Slowly slide down the wall or door until your knees bend __________ degrees. Keep your knees over your heels, not over your toes. Keep your knees in line with your hips. Hold this position for __________ seconds. Use the muscles in the front of your thigh to push yourself up to the standing position. Rest for __________ seconds after each repetition. Repeat __________ times. Complete this exercise __________ times a day. This information is not intended to replace advice given to you by your health care provider. Make sure you discuss any questions you have with your  healthcare provider. Document Revised: 12/04/2018 Document Reviewed: 06/23/2018 Elsevier Patient Education  2022 Elsevier Inc.      Follow-Up Instructions: No follow-ups on file.    Orders:  No orders of the defined types were placed in this encounter.   Follow-Up Instructions: No follow-ups on file.

## 2021-03-14 ENCOUNTER — Telehealth: Payer: Self-pay

## 2021-03-14 NOTE — Telephone Encounter (Signed)
Surgery denied by [facility]. P2P set up for either 03/22/21 or 03/26/21 in the afternoon. Gave Dr. Barbaraann Faster cell # and the triage #. I had to give 2 availability times and had to be 2 afternoon clinic times as they are [place] and would not be available until 11:00 am our time.  The surgery is scheduled for 03/27/21.

## 2021-03-15 ENCOUNTER — Ambulatory Visit (INDEPENDENT_AMBULATORY_CARE_PROVIDER_SITE_OTHER): Payer: 59 | Admitting: Surgery

## 2021-03-15 ENCOUNTER — Encounter: Payer: Self-pay | Admitting: Surgery

## 2021-03-15 VITALS — BP 124/70 | HR 58 | Ht 66.0 in | Wt 170.0 lb

## 2021-03-15 DIAGNOSIS — M4802 Spinal stenosis, cervical region: Secondary | ICD-10-CM

## 2021-03-15 NOTE — Progress Notes (Signed)
Surgical Instructions    Your procedure is scheduled on Tuesday August 2.  Report to [facility] Main Entrance "A" at 5:30 A.M., then check in with the Admitting office.  Call this number if you have problems the morning of surgery:  [phone]   If you have any questions prior to your surgery date call [phone]: Open Monday-Friday 8am-4pm    Remember:  Do not eat after midnight the night before your surgery  You may drink clear liquids until 4:30am the morning of your surgery.   Clear liquids allowed are: Water, Non-Citrus Juices (without pulp), Carbonated Beverages, Clear Tea, Black Coffee Only, and Gatorade    Take these medicines the morning of surgery with A SIP OF WATER :              acetaminophen (TYLENOL)               amLODipine (NORVASC)               atorvastatin (LIPITOR)              cetirizine (ZYRTEC)              hydrOXYzine (ATARAX/VISTARIL)              methocarbamol (ROBAXIN)if needed              omeprazole (PRILOSEC)  As of today, STOP taking any Aspirin (unless otherwise instructed by your surgeon) Aleve, Naproxen, Ibuprofen, Motrin, Advil, Goody's, BC's, all herbal medications, fish oil, and all vitamins.          Do not wear jewelry or makeup Do not wear lotions, powders, perfumes/colognes, or deodorant. Do not shave 48 hours prior to surgery.  Men may shave face and neck. Do not bring valuables to the hospital. DO Not wear nail polish, gel polish, artificial nails, or any other type of covering on  natural nails including finger and toenails. If patients have artificial nails, gel coating, etc. that need to be removed by a nail salon please have this removed prior to surgery or surgery may need to be canceled/delayed if the surgeon/ anesthesia feels like the patient is unable to be adequately monitored.             [facility] is not responsible for any belongings or valuables.  Do NOT Smoke (Tobacco/Vaping) or drink Alcohol 24 hours prior to your  procedure If you use a CPAP at night, you may bring all equipment for your overnight stay.   Contacts, glasses, dentures or bridgework may not be worn into surgery, please bring cases for these belongings   For patients admitted to the hospital, discharge time will be determined by your treatment team.   Patients discharged the day of surgery will not be allowed to drive home, and someone needs to stay with them for 24 hours.  ONLY 1 SUPPORT PERSON MAY BE PRESENT WHILE YOU ARE IN SURGERY. IF YOU ARE TO BE ADMITTED ONCE YOU ARE IN YOUR ROOM YOU WILL BE ALLOWED TWO (2) VISITORS.  Minor children may have two parents present. Special consideration for safety and communication needs will be reviewed on a case by case basis.  Special instructions:    Oral Hygiene is also important to reduce your risk of infection.  Remember - BRUSH YOUR TEETH THE MORNING OF SURGERY WITH YOUR REGULAR TOOTHPASTE   [facility]- Preparing For Surgery  Before surgery, you can play an important role. Because skin is not sterile, your skin  needs to be as free of germs as possible. You can reduce the number of germs on your skin by washing with CHG (chlorahexidine gluconate) Soap before surgery.  CHG is an antiseptic cleaner which kills germs and bonds with the skin to continue killing germs even after washing.     Please do not use if you have an allergy to CHG or antibacterial soaps. If your skin becomes reddened/irritated stop using the CHG.  Do not shave (including legs and underarms) for at least 48 hours prior to first CHG shower. It is OK to shave your face.  Please follow these instructions carefully.     Shower the NIGHT BEFORE SURGERY and the MORNING OF SURGERY with CHG Soap.   If you chose to wash your hair, wash your hair first as usual with your normal shampoo. After you shampoo, rinse your hair and body thoroughly to remove the shampoo.  Then [employer] and genitals (private parts) with your normal soap  and rinse thoroughly to remove soap.  After that Use CHG Soap as you would any other liquid soap. You can apply CHG directly to the skin and wash gently with a scrungie or a clean washcloth.   Apply the CHG Soap to your body ONLY FROM THE NECK DOWN.  Do not use on open wounds or open sores. Avoid contact with your eyes, ears, mouth and genitals (private parts). Wash Face and genitals (private parts)  with your normal soap.   Wash thoroughly, paying special attention to the area where your surgery will be performed.  Thoroughly rinse your body with warm water from the neck down.  DO NOT shower/wash with your normal soap after using and rinsing off the CHG Soap.  Pat yourself dry with a CLEAN TOWEL.  Wear CLEAN PAJAMAS to bed the night before surgery  Place CLEAN SHEETS on your bed the night before your surgery  DO NOT SLEEP WITH PETS.   Day of Surgery:  Take a shower with CHG soap. Wear Clean/Comfortable clothing the morning of surgery Do not apply any deodorants/lotions.   Remember to brush your teeth WITH YOUR REGULAR TOOTHPASTE.   Please read over the following fact sheets that you were given.

## 2021-03-16 ENCOUNTER — Encounter (HOSPITAL_COMMUNITY): Payer: Self-pay

## 2021-03-16 ENCOUNTER — Other Ambulatory Visit: Payer: Self-pay

## 2021-03-16 ENCOUNTER — Encounter (HOSPITAL_COMMUNITY)
Admission: RE | Admit: 2021-03-16 | Discharge: 2021-03-16 | Disposition: A | Discharge status: Home / Self Care | Payer: 59 | Source: Ambulatory Visit | Attending: Specialist | Admitting: Specialist

## 2021-03-16 DIAGNOSIS — Z01818 Encounter for other preprocedural examination: Secondary | ICD-10-CM | POA: Insufficient documentation

## 2021-03-16 HISTORY — DX: Cardiac murmur, unspecified: R01.1

## 2021-03-16 LAB — CBC
HCT: 42.8 % (ref 39.0–52.0)
Hemoglobin: 13.9 g/dL (ref 13.0–17.0)
MCH: 31.4 pg (ref 26.0–34.0)
MCHC: 32.5 g/dL (ref 30.0–36.0)
MCV: 96.6 fL (ref 80.0–100.0)
Platelets: 272 10*3/uL (ref 150–400)
RBC: 4.43 MIL/uL (ref 4.22–5.81)
RDW: 14 % (ref 11.5–15.5)
WBC: 10 10*3/uL (ref 4.0–10.5)
nRBC: 0 % (ref 0.0–0.2)

## 2021-03-16 LAB — COMPREHENSIVE METABOLIC PANEL
ALT: 31 U/L (ref 0–44)
AST: 27 U/L (ref 15–41)
Albumin: 3.8 g/dL (ref 3.5–5.0)
Alkaline Phosphatase: 61 U/L (ref 38–126)
Anion gap: 8 (ref 5–15)
BUN: 12 mg/dL (ref 8–23)
CO2: 26 mmol/L (ref 22–32)
Calcium: 8.9 mg/dL (ref 8.9–10.3)
Chloride: 107 mmol/L (ref 98–111)
Creatinine, Ser: 1 mg/dL (ref 0.61–1.24)
GFR, Estimated: >60 mL/min (ref >60)
Glucose, Bld: 93 mg/dL (ref 70–99)
Potassium: 3.3 mmol/L — ABNORMAL LOW (ref 3.5–5.1)
Sodium: 141 mmol/L (ref 135–145)
Total Bilirubin: 0.6 mg/dL (ref 0.3–1.2)
Total Protein: 6.7 g/dL (ref 6.5–8.1)

## 2021-03-16 LAB — TYPE AND SCREEN
ABO/RH(D): A POS
Antibody Screen: NEGATIVE

## 2021-03-16 LAB — SURGICAL PCR SCREEN
MRSA, PCR: NEGATIVE
Staphylococcus aureus: NEGATIVE

## 2021-03-16 NOTE — Progress Notes (Signed)
PCP: Mila Palmer, MD Cardiologist: Donato Schultz, MD  EKG: 03/16/21 CXR: na ECHO: 04/05/20 Stress Test: 07/18/20 Cardiac Cath: denies  Fasting Blood Sugar- na Checks Blood Sugar_na__ times a day  ASA: Last dose 03/15/21 Blood Thinner: No  OSA/CPAP: No  Covid test 7/29 or 8/1.  Anesthesia Review: No  Patient denies shortness of breath, fever, cough, and chest pain at PAT appointment.  Patient verbalized understanding of instructions provided today at the PAT appointment.  Patient asked to review instructions at home and day of surgery.

## 2021-03-19 ENCOUNTER — Telehealth: Payer: Self-pay | Admitting: Specialist

## 2021-03-19 NOTE — Telephone Encounter (Signed)
PT called stating his insurance denied him for surgery and he would like a CB to discuss any other options he may have.   [phone]

## 2021-03-19 NOTE — Telephone Encounter (Signed)
I called and advised that we have a peer-to-peer scheduled for 03/22/21 with his insurance company to discuss his case. So right now he will wait to see what we can find out from the insurance company

## 2021-03-23 ENCOUNTER — Other Ambulatory Visit (HOSPITAL_COMMUNITY)
Admission: RE | Admit: 2021-03-23 | Discharge: 2021-03-23 | Disposition: A | Discharge status: Home / Self Care | Payer: 59 | Source: Ambulatory Visit | Attending: Specialist | Admitting: Specialist

## 2021-03-23 DIAGNOSIS — Z20822 Contact with and (suspected) exposure to covid-19: Secondary | ICD-10-CM | POA: Diagnosis not present

## 2021-03-23 DIAGNOSIS — Z01812 Encounter for preprocedural laboratory examination: Secondary | ICD-10-CM | POA: Diagnosis present

## 2021-03-23 LAB — SARS CORONAVIRUS 2 (TAT 6-24 HRS): SARS Coronavirus 2: NEGATIVE

## 2021-03-23 NOTE — Telephone Encounter (Signed)
FYI-I called [facility] to see if another attempt was going to be made and they said it is still scheduled for 2nd attempt on 03/26/21.

## 2021-03-26 NOTE — H&P (Signed)
David Graves is an 65 y.o. male.   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  He continues to have ongoing neck pain and upper extremity radiculopathy.  No improvement from last office visit.  He is wanting to proceed with C4-5, C5-6 and C6-7 ACDF as scheduled.  Past Medical History:  Diagnosis Date   Anxiety    Asthma    no inhaler   Atopic dermatitis    Bee sting    BMI 29.0-29.9,adult    Cervical radiculopathy    Chronic insomnia    Complaint of paresthesia    Elevated blood pressure reading    Epididymitis    Family history of prostate cancer    GERD (gastroesophageal reflux disease)    Gout    Heart murmur    Hypertension    Lateral epicondylitis of right elbow    Pure hypercholesterolemia    Seasonal allergic rhinitis due to pollen     Past Surgical History:  Procedure Laterality Date   COLONOSCOPY  10/20/2017   RADIOLOGY WITH ANESTHESIA N/A 10/17/2020   Procedure: MRI WITH ANESTHESIA  LUMBAR WITHOUT CONTRAST AND CERVICAL WITHOUT CONTRAST;  Surgeon: Radiologist, Medication, MD;  Location: [facility] OR;  Service: Radiology;  Laterality: N/A;   UPPER GI ENDOSCOPY     VASECTOMY     Per patient    Family History  Problem Relation Age of Onset   Healthy Son    Healthy Son    Lupus Niece    Social History:  reports that he quit smoking about 8 years ago. His smoking use included cigarettes. He has never used smokeless tobacco. He reports previous alcohol use. He reports current drug use. Frequency: 4.00 times per week. Drug: Marijuana.  Allergies:  Allergies  Allergen Reactions   Bee Venom Swelling   Cinnamon Other (See Comments)    wheezing   Codeine Other (See Comments)    GI ISSUES   Colchicine Diarrhea   Hydrocodone-Acetaminophen Other (See Comments)    SWEATING   Shellfish Allergy Swelling    (TONGUE)   Sulfa Antibiotics Nausea And Vomiting    Penicillins Rash    No medications prior to admission.    No results found for this or any previous visit (from the past 48 hour(s)). No results found.  Review of Systems  Constitutional:  Positive for activity change.  HENT: Negative.    Respiratory: Negative.    Cardiovascular: Negative.   Gastrointestinal: Negative.   Genitourinary: Negative.   Musculoskeletal:  Positive for neck pain and neck stiffness.  Neurological: Negative.   Psychiatric/Behavioral: Negative.     There were no vitals taken for this visit. Physical Exam HENT:     Head: Normocephalic and atraumatic.     Nose: Nose normal.  Eyes:     Extraocular Movements: Extraocular movements intact.  Cardiovascular:     Rate and Rhythm: Regular rhythm.     Heart sounds: Normal heart sounds.  Pulmonary:     Effort: Pulmonary effort is normal. No respiratory distress.     Breath sounds: Normal breath sounds.  Abdominal:     General: There is no distension.  Musculoskeletal:        General: Tenderness present.  Neurological:     Mental Status: He is alert and oriented to person, place, and time.  Psychiatric:        Mood and Affect: Mood normal.  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  We will proceed with C4-5, C5-6 and C6-7 ACDF scheduled.  Surgical procedure discussed with patient in detail.  All questions answered.  Zonia Kief, PA-C 03/26/2021, 3:23 PM

## 2021-03-26 NOTE — Progress Notes (Signed)
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  He continues to have ongoing neck pain and upper extremity radiculopathy.  No improvement from last office visit.  He is wanting to proceed with C4-5, C5-6 and C6-7 ACDF as scheduled.  Today history and physical performed.  Review of systems negative.  Surgery procedure discussed in detail.  All questions answered and he wishes to proceed.

## 2021-03-26 NOTE — Telephone Encounter (Signed)
Dr. Otelia Sergeant was never called for this p2p. He said he got 1 call that rang 1 time and hung up. This could have been them but he isnt sure.

## 2021-03-27 ENCOUNTER — Encounter (HOSPITAL_COMMUNITY): Admission: RE | Payer: Self-pay | Source: Home / Self Care

## 2021-03-27 ENCOUNTER — Inpatient Hospital Stay (HOSPITAL_COMMUNITY): Admission: RE | Admit: 2021-03-27 | Payer: 59 | Source: Home / Self Care | Admitting: Specialist

## 2021-03-27 SURGERY — ANTERIOR CERVICAL DECOMPRESSION/DISCECTOMY FUSION 3 LEVELS
Anesthesia: General

## 2021-03-28 ENCOUNTER — Telehealth: Payer: Self-pay | Admitting: Radiology

## 2021-03-28 NOTE — Telephone Encounter (Signed)
Per Neysa Bonito, Dr. Otelia Sergeant did peer to peer today and surgery was denied due to no P.T.  Neysa Bonito stated:  I called and lmom for him to call me back.   1) Is he still working?   2) His insurance has denied his surgery due to no PT, he has to try at least a couple of weeks of PT and fail it.   3) Also need to know where he would like to go for PT?  Would like to resubmit to insurance after P.T.

## 2021-03-28 NOTE — Telephone Encounter (Signed)
I called and lmom for him to call me back.  1) Is he still working?  2) His insurance has denied his surgery due to no PT, he has to try at least a couple of weeks of PT and fail it.   3) Also need to know where he would like to go for PT?

## 2021-03-29 ENCOUNTER — Other Ambulatory Visit: Payer: Self-pay | Admitting: Radiology

## 2021-03-29 DIAGNOSIS — M4802 Spinal stenosis, cervical region: Secondary | ICD-10-CM

## 2021-03-29 DIAGNOSIS — R202 Paresthesia of skin: Secondary | ICD-10-CM

## 2021-03-29 NOTE — Telephone Encounter (Signed)
FYI-- called and spoke with patient.  He is not working at this time.  He will do the PT, order was placed for our office.

## 2021-03-30 ENCOUNTER — Ambulatory Visit: Payer: 59 | Admitting: Specialist

## 2021-04-03 ENCOUNTER — Encounter: Payer: Self-pay | Admitting: Physical Therapy

## 2021-04-03 ENCOUNTER — Other Ambulatory Visit: Payer: Self-pay

## 2021-04-03 ENCOUNTER — Ambulatory Visit (INDEPENDENT_AMBULATORY_CARE_PROVIDER_SITE_OTHER): Payer: 59 | Admitting: Physical Therapy

## 2021-04-03 DIAGNOSIS — M6281 Muscle weakness (generalized): Secondary | ICD-10-CM | POA: Diagnosis not present

## 2021-04-03 DIAGNOSIS — M5412 Radiculopathy, cervical region: Secondary | ICD-10-CM | POA: Diagnosis not present

## 2021-04-03 NOTE — Patient Instructions (Signed)
Access Code: [email or web address] URL: [email or web address] Date: 04/03/2021 Prepared by: Moshe Cipro  Exercises Supine Chin Tuck - 2 x daily - 7 x weekly - 1 sets - 10 reps - 5 sec hold Seated Cervical Rotation AROM - 2 x daily - 7 x weekly - 1 sets - 10 reps - 5-10 sec hold Standing Backward Shoulder Rolls - 2 x daily - 7 x weekly - 1 sets - 10 reps Seated Scapular Retraction - 2 x daily - 7 x weekly - 1 sets - 10 reps - 5 sec hold Standing Shoulder Flexion to 90 Degrees with Dumbbells - 1 x daily - 7 x weekly - 3 sets - 10 reps Shoulder Abduction with Dumbbells - Thumbs Up - 1 x daily - 7 x weekly - 3 sets - 10 reps

## 2021-04-03 NOTE — Therapy (Signed)
[facility] Physical Therapy [address] [place], [place], [address] Phone: [phone]   Fax:  [phone]  Physical Therapy Evaluation  Patient Details  Name: David Graves MRN: [id number] Date of Birth: [birth year] Referring Provider (PT): Kerrin Champagne, MD   Encounter Date: 04/03/2021   PT End of Session - 04/03/21 1044     Visit Number 1    Number of Visits 6    Date for PT Re-Evaluation 05/15/21    Authorization Type [employer]    PT Start Time 1005    PT Stop Time 1035    PT Time Calculation (min) 30 min    Activity Tolerance Patient tolerated treatment well    Behavior During Therapy [facility] for tasks assessed/performed             Past Medical History:  Diagnosis Date   Anxiety    Asthma    no inhaler   Atopic dermatitis    Bee sting    BMI 29.0-29.9,adult    Cervical radiculopathy    Chronic insomnia    Complaint of paresthesia    Elevated blood pressure reading    Epididymitis    Family history of prostate cancer    GERD (gastroesophageal reflux disease)    Gout    Heart murmur    Hypertension    Lateral epicondylitis of right elbow    Pure hypercholesterolemia    Seasonal allergic rhinitis due to pollen     Past Surgical History:  Procedure Laterality Date   COLONOSCOPY  10/20/2017   RADIOLOGY WITH ANESTHESIA N/A 10/17/2020   Procedure: MRI WITH ANESTHESIA  LUMBAR WITHOUT CONTRAST AND CERVICAL WITHOUT CONTRAST;  Surgeon: Radiologist, Medication, MD;  Location: [facility] OR;  Service: Radiology;  Laterality: N/A;   UPPER GI ENDOSCOPY     VASECTOMY     Per patient    There were no vitals filed for this visit.    Subjective Assessment - 04/03/21 1008     Subjective Pt is a 65 y/o male who presents to OPPT for neck pain and bil UE/hand pain for many years.  MRI shows severe stenosis in cervical spine, and he was scheduled for ACDF C4-7 which was canceled as he had not had physical therapy.    Limitations Lifting;Sitting;House  hold activities    Diagnostic tests MRI: severe stenosis    Patient Stated Goals hopeful for some pain relief, has to complete this before surgery    Currently in Pain? Yes    Pain Score 0-No pain   up to 8/10   Pain Location Neck    Pain Orientation Right;Left;Posterior    Pain Type Chronic pain    Pain Radiating Towards bil UEs to fingertips    Pain Onset More than a month ago    Pain Frequency Intermittent    Aggravating Factors  sitting at computer, lifting, driving, any increased use of hands/arms    Pain Relieving Factors tylenol                [facility] PT Assessment - 04/03/21 1001       Assessment   Medical Diagnosis M48.02 (ICD-10-CM) - Spinal stenosis of cervical region  R20.2 (ICD-10-CM) - Paresthesias in right hand  R20.2 (ICD-10-CM) - Paresthesias in left hand    Referring Provider (PT) Kerrin Champagne, MD    Onset Date/Surgical Date --   chronic x many years   Hand Dominance Right    Next MD Visit not scheduled; likely planning surgery  Prior Therapy n/a      Precautions   Precautions None      Restrictions   Weight Bearing Restrictions No      Balance Screen   Has the patient fallen in the past 6 months Yes    How many times? 1    Has the patient had a decrease in activity level because of a fear of falling?  No    Is the patient reluctant to leave their home because of a fear of falling?  No      Home Environment   Living Environment Private residence    Living Arrangements Spouse/significant other    Available Help at Discharge Family;Available PRN/intermittently    Type of Home House    Additional Comments no difficulty with ADLs or mobility      Prior Function   Level of Independence Independent    Vocation Retired    [employer] retired Theatre manager    Leisure go to park, go walking; no regular exercise      Cognition   Overall Cognitive Status Within Functional Limits for tasks assessed      Observation/Other  Assessments   Focus on Therapeutic Outcomes (FOTO)  42 (predicted 48)      Posture/Postural Control   Posture/Postural Control Postural limitations    Postural Limitations Rounded Shoulders;Forward head;Increased thoracic kyphosis      ROM / Strength   AROM / PROM / Strength AROM;Strength      AROM   AROM Assessment Site Cervical    Cervical Flexion 48    Cervical Extension 23    Cervical - Right Side Bend 20   ipsilateral pain   Cervical - Left Side Bend 20   ipsilateral pain   Cervical - Right Rotation 48    Cervical - Left Rotation 47      Strength   Strength Assessment Site Hand;Shoulder    Right/Left Shoulder Right;Left    Right Shoulder Flexion 3+/5    Right Shoulder ABduction 4/5    Left Shoulder Flexion 3+/5    Left Shoulder ABduction 4/5    Right/Left hand Right;Left    Right Hand Grip (lbs) --   53.1, 51.1, 49.8 = 51.3#   Left Hand Grip (lbs) --   62.0, 56.1, 52.2 = 56.8#     Palpation   Palpation comment very tender to light touch along bil UT, cervical paraspinals, and LS      Special Tests    Special Tests Cervical    Cervical Tests Spurling's;Dictraction      Spurling's   Comment increased Lt sided neck pain, no radiculopathy      Distraction Test   Comment mild improvement in symptoms                        Objective measurements completed on examination: See above findings.       [facility] Adult PT Treatment/Exercise - 04/03/21 1001       Exercises   Exercises Other Exercises    Other Exercises  see pt instructions - reviewed and pt performed 1-3 reps with min cues                    PT Education - 04/03/21 1043     Education Details HEP    Person(s) Educated Patient    Methods Explanation;Demonstration;Handout    Comprehension Verbalized understanding;Returned demonstration;Need further instruction  PT Short Term Goals - 04/03/21 1047       PT SHORT TERM GOAL #1   Title independent with  initial HEP    Time 3    Period Weeks    Status New    Target Date 04/24/21               PT Long Term Goals - 04/03/21 1003       PT LONG TERM GOAL #1   Title Independent with final HEP    Time 6    Period Weeks    Status New    Target Date 05/15/21      PT LONG TERM GOAL #2   Title FOTO score improved to 48  for improved function    Time 6    Period Weeks    Status New    Target Date 05/15/21      PT LONG TERM GOAL #3   Title Cervical rotation AROM improved to 60 deg for improved mobility    Time 6    Period Weeks    Status New    Target Date 05/15/21      PT LONG TERM GOAL #4   Title Report pain < 4/10 for improved function    Time 6    Period Weeks    Status New    Target Date 05/15/21      PT LONG TERM GOAL #5   Title Demonstrate at least 65# grip strength in bil hands for improved function    Time 6    Period Weeks    Status New    Target Date 05/15/21                    Plan - 04/03/21 1044     Clinical Impression Statement Pt is a 65 y/o male who presents to OPPT for chronic neck pain with known severe cervical stenosis.  He was scheduled for surgery, which was initially denied as he had not completed PT.  Pt demonstrates decreased ROM, strength and postural abnormalities affecting functional mobility.  Pt will benefit from PT to address deficits listed.    Personal Factors and Comorbidities Comorbidity 3+;Time since onset of injury/illness/exacerbation    Comorbidities HTN, HLD, anxiety    Examination-Activity Limitations Carry;Lift;Reach Overhead    Examination-Participation Restrictions Community Activity;Cleaning;Occupation    Stability/Clinical Decision Making Evolving/Moderate complexity    Clinical Decision Making Moderate    Rehab Potential Good    PT Frequency 1x / week    PT Duration 6 weeks    PT Treatment/Interventions ADLs/Self Care Home Management;Cryotherapy;Electrical Stimulation;Moist Heat;Traction;Therapeutic  exercise;Therapeutic activities;Neuromuscular re-education;Patient/family education;Manual techniques;Taping;Dry needling;Passive range of motion    PT Next Visit Plan review HEP, manual/modalities PRN for pain, postural exercises    PT Home Exercise Plan Access Code: [email or web address]    Consulted and Agree with Plan of Care Patient             Patient will benefit from skilled therapeutic intervention in order to improve the following deficits and impairments:  Decreased strength, Pain, Impaired UE functional use, Increased muscle spasms, Decreased range of motion, Impaired flexibility, Postural dysfunction  Visit Diagnosis: Radiculopathy, cervical region - Plan: PT plan of care cert/re-cert  Muscle weakness (generalized) - Plan: PT plan of care cert/re-cert     Problem List Patient Active Problem List   Diagnosis Date Noted   GERD (gastroesophageal reflux disease) 06/28/2020   Hyperlipidemia 06/28/2020   Hypertension 06/28/2020   Bilateral hand  pain 06/28/2020   Rheumatoid factor positive 06/28/2020   Anxiety 12/09/2018      Clarita Crane, PT, DPT 04/03/21 10:50 AM     [facility] Physical Therapy [address] [place], [place], [address] Phone: [phone]   Fax:  [phone]  Name: Dijon Cosens MRN: [id number] Date of Birth: June 27, 1956

## 2021-04-05 ENCOUNTER — Encounter: Payer: 59 | Admitting: Specialist

## 2021-04-09 ENCOUNTER — Ambulatory Visit (INDEPENDENT_AMBULATORY_CARE_PROVIDER_SITE_OTHER): Payer: 59 | Admitting: Physical Therapy

## 2021-04-09 ENCOUNTER — Other Ambulatory Visit: Payer: Self-pay

## 2021-04-09 ENCOUNTER — Encounter: Payer: Self-pay | Admitting: Physical Therapy

## 2021-04-09 DIAGNOSIS — M5412 Radiculopathy, cervical region: Secondary | ICD-10-CM | POA: Diagnosis not present

## 2021-04-09 DIAGNOSIS — M6281 Muscle weakness (generalized): Secondary | ICD-10-CM | POA: Diagnosis not present

## 2021-04-09 NOTE — Therapy (Signed)
[facility] Physical Therapy [address] [place], [place], [address] Phone: [phone]   Fax:  [phone]  Physical Therapy Treatment  Patient Details  Name: David Graves MRN: [id number] Date of Birth: 07/20/1956 Referring Provider (PT): Kerrin Champagne, MD   Encounter Date: 04/09/2021   PT End of Session - 04/09/21 1402     Visit Number 2    Number of Visits 6    Date for PT Re-Evaluation 05/15/21    Authorization Type [employer]    PT Start Time 1345    PT Stop Time 1415    PT Time Calculation (min) 30 min    Activity Tolerance Patient tolerated treatment well    Behavior During Therapy [facility] for tasks assessed/performed             Past Medical History:  Diagnosis Date   Anxiety    Asthma    no inhaler   Atopic dermatitis    Bee sting    BMI 29.0-29.9,adult    Cervical radiculopathy    Chronic insomnia    Complaint of paresthesia    Elevated blood pressure reading    Epididymitis    Family history of prostate cancer    GERD (gastroesophageal reflux disease)    Gout    Heart murmur    Hypertension    Lateral epicondylitis of right elbow    Pure hypercholesterolemia    Seasonal allergic rhinitis due to pollen     Past Surgical History:  Procedure Laterality Date   COLONOSCOPY  10/20/2017   RADIOLOGY WITH ANESTHESIA N/A 10/17/2020   Procedure: MRI WITH ANESTHESIA  LUMBAR WITHOUT CONTRAST AND CERVICAL WITHOUT CONTRAST;  Surgeon: Radiologist, Medication, MD;  Location: [facility] OR;  Service: Radiology;  Laterality: N/A;   UPPER GI ENDOSCOPY     VASECTOMY     Per patient    There were no vitals filed for this visit.   Subjective Assessment - 04/09/21 1344     Subjective reports more headaches, doing "some" exercises.    Limitations Lifting;Sitting;House hold activities    Diagnostic tests MRI: severe stenosis    Patient Stated Goals hopeful for some pain relief, has to complete this before surgery    Currently in Pain? Yes     Pain Score 6     Pain Location Neck    Pain Orientation Right;Left    Pain Descriptors / Indicators Aching    Pain Type Chronic pain    Pain Onset More than a month ago    Pain Frequency Intermittent    Aggravating Factors  sitting at computer, lifting, driving, increased use of hands/arms    Pain Relieving Factors tylenol                               [facility] Adult PT Treatment/Exercise - 04/09/21 1352       Exercises   Exercises Neck      Neck Exercises: Seated   Shoulder Rolls Backwards;5 reps    Other Seated Exercise scapular retraction 10 x 5 sec hold      Modalities   Modalities Traction      Traction   Type of Traction Cervical    Min (lbs) 10    Max (lbs) 15    Hold Time 20    Rest Time 60    Time 10      Manual Therapy   Manual therapy comments discussed possibility of DN -  deferred today as pt without a headache; did provide tennis balls in sleeve for suboccipital release at home if headaches are present                    PT Education - 04/09/21 1402     Education Details use of tennis balls for SOR, DN (verbally)    Person(s) Educated Patient    Methods Explanation    Comprehension Verbalized understanding              PT Short Term Goals - 04/09/21 1420       PT SHORT TERM GOAL #1   Title independent with initial HEP    Baseline 8/15: reviewed some today with min cues    Time 3    Period Weeks    Status On-going    Target Date 04/24/21               PT Long Term Goals - 04/03/21 1003       PT LONG TERM GOAL #1   Title Independent with final HEP    Time 6    Period Weeks    Status New    Target Date 05/15/21      PT LONG TERM GOAL #2   Title FOTO score improved to 48  for improved function    Time 6    Period Weeks    Status New    Target Date 05/15/21      PT LONG TERM GOAL #3   Title Cervical rotation AROM improved to 60 deg for improved mobility    Time 6    Period Weeks    Status New     Target Date 05/15/21      PT LONG TERM GOAL #4   Title Report pain < 4/10 for improved function    Time 6    Period Weeks    Status New    Target Date 05/15/21      PT LONG TERM GOAL #5   Title Demonstrate at least 65# grip strength in bil hands for improved function    Time 6    Period Weeks    Status New    Target Date 05/15/21                   Plan - 04/09/21 1421     Clinical Impression Statement Pt arrived to PT today reporting minimal compliance with HEP at this time (he's doing shoulder exercises) and needed min cues for review of other exercises.  Trial of traction today but pt severly claustrophobic so only completed for 10 min.  Pt also reporting increased headaches, so provided with tennis balls for SOR at home to try when he is having a headache.  May benefit from DN but deferred today.    Personal Factors and Comorbidities Comorbidity 3+;Time since onset of injury/illness/exacerbation    Comorbidities HTN, HLD, anxiety    Examination-Activity Limitations Carry;Lift;Reach Overhead    Examination-Participation Restrictions Community Activity;Cleaning;Occupation    Stability/Clinical Decision Making Evolving/Moderate complexity    Rehab Potential Good    PT Frequency 1x / week    PT Duration 6 weeks    PT Treatment/Interventions ADLs/Self Care Home Management;Cryotherapy;Electrical Stimulation;Moist Heat;Traction;Therapeutic exercise;Therapeutic activities;Neuromuscular re-education;Patient/family education;Manual techniques;Taping;Dry needling;Passive range of motion    PT Next Visit Plan review HEP PRN, manual/modalities PRN for pain - assess response to traction, postural exercises, possible DN    PT Home Exercise Plan Access Code: [email or web address]  Consulted and Agree with Plan of Care Patient             Patient will benefit from skilled therapeutic intervention in order to improve the following deficits and impairments:  Decreased strength, Pain,  Impaired UE functional use, Increased muscle spasms, Decreased range of motion, Impaired flexibility, Postural dysfunction  Visit Diagnosis: Radiculopathy, cervical region  Muscle weakness (generalized)     Problem List Patient Active Problem List   Diagnosis Date Noted   GERD (gastroesophageal reflux disease) 06/28/2020   Hyperlipidemia 06/28/2020   Hypertension 06/28/2020   Bilateral hand pain 06/28/2020   Rheumatoid factor positive 06/28/2020   Anxiety 12/09/2018     Clarita Crane, PT, DPT 04/09/21 2:25 PM     [facility] Physical Therapy [address] [place], [place], [address] Phone: [phone]   Fax:  [phone]  Name: David Graves MRN: [id number] Date of Birth: 24-Feb-1956

## 2021-04-18 ENCOUNTER — Other Ambulatory Visit: Payer: Self-pay

## 2021-04-18 ENCOUNTER — Ambulatory Visit (INDEPENDENT_AMBULATORY_CARE_PROVIDER_SITE_OTHER): Payer: 59 | Admitting: Physical Therapy

## 2021-04-18 ENCOUNTER — Encounter: Payer: Self-pay | Admitting: Physical Therapy

## 2021-04-18 DIAGNOSIS — M6281 Muscle weakness (generalized): Secondary | ICD-10-CM | POA: Diagnosis not present

## 2021-04-18 DIAGNOSIS — M5412 Radiculopathy, cervical region: Secondary | ICD-10-CM | POA: Diagnosis not present

## 2021-04-18 NOTE — Therapy (Addendum)
[facility] Physical Therapy [address] [place], [place], [address] Phone: [phone]   Fax:  [phone]  Physical Therapy Treatment  Patient Details  Name: David Graves MRN: [id number] Date of Birth: 09-17-1955 Referring Provider (PT): Kerrin Champagne, MD   Encounter Date: 04/18/2021   PT End of Session - 04/18/21 1326     Visit Number 3    Number of Visits 6    Date for PT Re-Evaluation 05/15/21    Authorization Type [employer]    PT Start Time 1304    PT Stop Time 1336    PT Time Calculation (min) 32 min    Activity Tolerance Patient tolerated treatment well    Behavior During Therapy [facility] for tasks assessed/performed             Past Medical History:  Diagnosis Date   Anxiety    Asthma    no inhaler   Atopic dermatitis    Bee sting    BMI 29.0-29.9,adult    Cervical radiculopathy    Chronic insomnia    Complaint of paresthesia    Elevated blood pressure reading    Epididymitis    Family history of prostate cancer    GERD (gastroesophageal reflux disease)    Gout    Heart murmur    Hypertension    Lateral epicondylitis of right elbow    Pure hypercholesterolemia    Seasonal allergic rhinitis due to pollen     Past Surgical History:  Procedure Laterality Date   COLONOSCOPY  10/20/2017   RADIOLOGY WITH ANESTHESIA N/A 10/17/2020   Procedure: MRI WITH ANESTHESIA  LUMBAR WITHOUT CONTRAST AND CERVICAL WITHOUT CONTRAST;  Surgeon: Radiologist, Medication, MD;  Location: [facility] OR;  Service: Radiology;  Laterality: N/A;   UPPER GI ENDOSCOPY     VASECTOMY     Per patient    There were no vitals filed for this visit.   Subjective Assessment - 04/18/21 1308     Subjective headaches are more frequent; no headache currently.  no known triggers.  feels they happen 3-4 times a day, up to 8-9/10. doesn't feel traction was helpful    Limitations Lifting;Sitting;House hold activities    Diagnostic tests MRI: severe stenosis    Patient  Stated Goals hopeful for some pain relief, has to complete this before surgery    Currently in Pain? Yes    Pain Score 7     Pain Location Neck    Pain Orientation Right;Left    Pain Descriptors / Indicators Aching    Pain Type Chronic pain    Pain Onset More than a month ago    Pain Frequency Intermittent    Aggravating Factors  sitting, driving, lifting    Pain Relieving Factors tylenol                [facility] PT Assessment - 04/18/21 1310       Assessment   Medical Diagnosis M48.02 (ICD-10-CM) - Spinal stenosis of cervical region  R20.2 (ICD-10-CM) - Paresthesias in right hand  R20.2 (ICD-10-CM) - Paresthesias in left hand      Strength   Right Hand Grip (lbs) 55.9#   50.3, 58.9, 58.4   Left Hand Grip (lbs) 49.9#   55.1, 46.0, 48.7                          [facility] Adult PT Treatment/Exercise - 04/18/21 1325       Self-Care   Self-Care  Other Self-Care Comments    Other Self-Care Comments  discussed HEP and decreasing weight to help with pain, also reviewed grip strength findings      Modalities   Modalities Electrical Stimulation      Electrical Stimulation   Electrical Stimulation Location neck/upper trap    Electrical Stimulation Action IFC    Electrical Stimulation Parameters to tolerance x 15 min    Electrical Stimulation Goals Pain;Tone                      PT Short Term Goals - 04/09/21 1420       PT SHORT TERM GOAL #1   Title independent with initial HEP    Baseline 8/15: reviewed some today with min cues    Time 3    Period Weeks    Status On-going    Target Date 04/24/21               PT Long Term Goals - 04/03/21 1003       PT LONG TERM GOAL #1   Title Independent with final HEP    Time 6    Period Weeks    Status New    Target Date 05/15/21      PT LONG TERM GOAL #2   Title FOTO score improved to 48  for improved function    Time 6    Period Weeks    Status New    Target Date 05/15/21      PT LONG  TERM GOAL #3   Title Cervical rotation AROM improved to 60 deg for improved mobility    Time 6    Period Weeks    Status New    Target Date 05/15/21      PT LONG TERM GOAL #4   Title Report pain < 4/10 for improved function    Time 6    Period Weeks    Status New    Target Date 05/15/21      PT LONG TERM GOAL #5   Title Demonstrate at least 65# grip strength in bil hands for improved function    Time 6    Period Weeks    Status New    Target Date 05/15/21                 04/18/21 1327  Plan  Clinical Impression Statement Pt reporting no improvement in symptoms and feels headaches and pain in hands is increasing.  Grip strength reassessed today and improved on Rt but decreased on Lt.  Will notify providers of findings today.  Trial of estim to see if home TENS unit may be beneficial today.  At this time pt has demonstrated no improvement with increased pain and decreased Lt grip weakness with PT interventions including exercises and modalities of traction and estim.  Personal Factors and Comorbidities Comorbidity 3+;Time since onset of injury/illness/exacerbation  Comorbidities HTN, HLD, anxiety  Examination-Activity Limitations Carry;Lift;Reach Overhead  Examination-Participation Restrictions Community Activity;Cleaning;Occupation  Pt will benefit from skilled therapeutic intervention in order to improve on the following deficits Decreased strength;Pain;Impaired UE functional use;Increased muscle spasms;Decreased range of motion;Impaired flexibility;Postural dysfunction  Stability/Clinical Decision Making Evolving/Moderate complexity  Rehab Potential Good  PT Frequency 1x / week  PT Duration 6 weeks  PT Treatment/Interventions ADLs/Self Care Home Management;Cryotherapy;Electrical Stimulation;Moist Heat;Traction;Therapeutic exercise;Therapeutic activities;Neuromuscular re-education;Patient/family education;Manual techniques;Taping;Dry needling;Passive range of motion  PT  Next Visit Plan review HEP PRN, manual/modalities PRN for pain - assess response to estim, postural  exercises, possible DN (no headache today - deferred)  PT Home Exercise Plan Access Code: [email or web address]  Consulted and Agree with Plan of Care Patient     Patient will benefit from skilled therapeutic intervention in order to improve the following deficits and impairments:  Decreased strength, Pain, Impaired UE functional use, Increased muscle spasms, Decreased range of motion, Impaired flexibility, Postural dysfunction  Visit Diagnosis: Radiculopathy, cervical region  Muscle weakness (generalized)     Problem List Patient Active Problem List   Diagnosis Date Noted   GERD (gastroesophageal reflux disease) 06/28/2020   Hyperlipidemia 06/28/2020   Hypertension 06/28/2020   Bilateral hand pain 06/28/2020   Rheumatoid factor positive 06/28/2020   Anxiety 12/09/2018      Clarita Crane, PT, DPT 04/18/21 1:29 PM     [facility] Physical Therapy [address] [place], [place], [address] Phone: [phone]   Fax:  [phone]  Name: David Graves MRN: [id number] Date of Birth: [birth year]

## 2021-04-23 NOTE — Progress Notes (Signed)
See message from [place].

## 2021-04-24 ENCOUNTER — Other Ambulatory Visit: Payer: Self-pay

## 2021-04-24 ENCOUNTER — Ambulatory Visit (INDEPENDENT_AMBULATORY_CARE_PROVIDER_SITE_OTHER): Payer: 59 | Admitting: Physical Therapy

## 2021-04-24 ENCOUNTER — Encounter: Payer: Self-pay | Admitting: Physical Therapy

## 2021-04-24 DIAGNOSIS — M5412 Radiculopathy, cervical region: Secondary | ICD-10-CM

## 2021-04-24 DIAGNOSIS — M6281 Muscle weakness (generalized): Secondary | ICD-10-CM | POA: Diagnosis not present

## 2021-04-24 NOTE — Therapy (Signed)
[facility] Physical Therapy [address] [place], [place], [address] Phone: [phone]   Fax:  [phone]  Physical Therapy Treatment  Patient Details  Name: David Graves MRN: [id number] Date of Birth: 1955-10-02 Referring Provider (PT): Kerrin Champagne, MD   Encounter Date: 04/24/2021   PT End of Session - 04/24/21 1117     Visit Number 4    Number of Visits 6    Date for PT Re-Evaluation 05/15/21    Authorization Type [employer]    PT Start Time 1100    PT Stop Time 1132    PT Time Calculation (min) 32 min    Activity Tolerance Patient tolerated treatment well    Behavior During Therapy [name] for tasks assessed/performed             Past Medical History:  Diagnosis Date   Anxiety    Asthma    no inhaler   Atopic dermatitis    Bee sting    BMI 29.0-29.9,adult    Cervical radiculopathy    Chronic insomnia    Complaint of paresthesia    Elevated blood pressure reading    Epididymitis    Family history of prostate cancer    GERD (gastroesophageal reflux disease)    Gout    Heart murmur    Hypertension    Lateral epicondylitis of right elbow    Pure hypercholesterolemia    Seasonal allergic rhinitis due to pollen     Past Surgical History:  Procedure Laterality Date   COLONOSCOPY  10/20/2017   RADIOLOGY WITH ANESTHESIA N/A 10/17/2020   Procedure: MRI WITH ANESTHESIA  LUMBAR WITHOUT CONTRAST AND CERVICAL WITHOUT CONTRAST;  Surgeon: Radiologist, Medication, MD;  Location: [facility] OR;  Service: Radiology;  Laterality: N/A;   UPPER GI ENDOSCOPY     VASECTOMY     Per patient    There were no vitals filed for this visit.   Subjective Assessment - 04/24/21 1100     Subjective reports pain is "about the same."  no headache at the moment - but no change at this time.    Limitations Lifting;Sitting;House hold activities    Diagnostic tests MRI: severe stenosis    Patient Stated Goals hopeful for some pain relief, has to complete this before  surgery    Currently in Pain? Yes    Pain Score 6     Pain Location Hand    Pain Orientation Left;Right    Pain Descriptors / Indicators Aching    Pain Type Chronic pain    Pain Onset More than a month ago    Pain Frequency Intermittent    Aggravating Factors  sitting, driving, lifting    Pain Relieving Factors tylenol                               [facility] Adult PT Treatment/Exercise - 04/24/21 1103       Neck Exercises: Theraband   Shoulder External Rotation 10 reps;Red      Neck Exercises: Seated   Neck Retraction 10 reps;3 secs    Shoulder Rolls Backwards;20 reps    Other Seated Exercise scapular retraction x 10 reps    Other Seated Exercise neck circles x 10 reps each way      Electrical Stimulation   Electrical Stimulation Location neck/upper trap    Electrical Stimulation Action IFC    Electrical Stimulation Parameters to tolerance x 15 min    Electrical Stimulation  Goals Pain;Tone                      PT Short Term Goals - 04/24/21 1118       PT SHORT TERM GOAL #1   Title independent with initial HEP    Time 3    Period Weeks    Status Achieved    Target Date 04/24/21               PT Long Term Goals - 04/24/21 1118       PT LONG TERM GOAL #1   Title Independent with final HEP    Time 6    Period Weeks    Status On-going    Target Date 05/15/21      PT LONG TERM GOAL #2   Title FOTO score improved to 48  for improved function    Time 6    Period Weeks    Status On-going      PT LONG TERM GOAL #3   Title Cervical rotation AROM improved to 60 deg for improved mobility    Time 6    Period Weeks    Status On-going      PT LONG TERM GOAL #4   Title Report pain < 4/10 for improved function    Time 6    Period Weeks    Status On-going      PT LONG TERM GOAL #5   Title Demonstrate at least 65# grip strength in bil hands for improved function    Time 6    Period Weeks    Status On-going                    Plan - 04/24/21 1118     Clinical Impression Statement Pt continues to report no change in symptoms and have exhausted nearly all PT interventions at this time.  He is independent with current HEP despite no benefit in symptoms.  Will continue with PT x 2 more weeks to see if improvements can be made.    Personal Factors and Comorbidities Comorbidity 3+;Time since onset of injury/illness/exacerbation    Comorbidities HTN, HLD, anxiety    Examination-Activity Limitations Carry;Lift;Reach Overhead    Examination-Participation Restrictions Community Activity;Cleaning;Occupation    Stability/Clinical Decision Making Evolving/Moderate complexity    Rehab Potential Good    PT Frequency 1x / week    PT Duration 6 weeks    PT Treatment/Interventions ADLs/Self Care Home Management;Cryotherapy;Electrical Stimulation;Moist Heat;Traction;Therapeutic exercise;Therapeutic activities;Neuromuscular re-education;Patient/family education;Manual techniques;Taping;Dry needling;Passive range of motion    PT Next Visit Plan review HEP PRN, manual/modalities PRN for pain - assess response to estim, postural exercises, capture FOTO    PT Home Exercise Plan Access Code: [email or web address]    Consulted and Agree with Plan of Care Patient             Patient will benefit from skilled therapeutic intervention in order to improve the following deficits and impairments:  Decreased strength, Pain, Impaired UE functional use, Increased muscle spasms, Decreased range of motion, Impaired flexibility, Postural dysfunction  Visit Diagnosis: Radiculopathy, cervical region  Muscle weakness (generalized)     Problem List Patient Active Problem List   Diagnosis Date Noted   GERD (gastroesophageal reflux disease) 06/28/2020   Hyperlipidemia 06/28/2020   Hypertension 06/28/2020   Bilateral hand pain 06/28/2020   Rheumatoid factor positive 06/28/2020   Anxiety 12/09/2018      Clarita Crane, PT,  DPT 04/24/21 11:20 AM  [facility] Physical Therapy [address] [place], [place], [address] Phone: [phone]   Fax:  [phone]  Name: David Graves MRN: [id number] Date of Birth: 08/31/1955

## 2021-04-27 NOTE — Progress Notes (Signed)
Trying to reach patient to schedule office appointment.  I called patient several times.  Phone rings and then turns to busy signal.

## 2021-05-01 ENCOUNTER — Encounter: Payer: 59 | Admitting: Physical Therapy

## 2021-05-01 ENCOUNTER — Telehealth: Payer: Self-pay | Admitting: Physical Therapy

## 2021-05-01 NOTE — Telephone Encounter (Signed)
LVM for pt as he did not show for PT appt.  Did advise of next scheduled PT appt and need for MD visit.    Clarita Crane, PT, DPT 05/01/21 11:19 AM

## 2021-05-02 ENCOUNTER — Telehealth: Payer: Self-pay | Admitting: Physical Medicine and Rehabilitation

## 2021-05-02 NOTE — Progress Notes (Signed)
I called patient and left a voice mail for a call back to schedule an appointment with Dr. Otelia Sergeant.

## 2021-05-02 NOTE — Telephone Encounter (Signed)
Pt returned call to [place]. Please call pt at [phone].

## 2021-05-02 NOTE — Telephone Encounter (Signed)
I have never called this patient.

## 2021-05-08 ENCOUNTER — Ambulatory Visit (INDEPENDENT_AMBULATORY_CARE_PROVIDER_SITE_OTHER): Payer: 59 | Admitting: Physical Therapy

## 2021-05-08 ENCOUNTER — Other Ambulatory Visit: Payer: Self-pay

## 2021-05-08 ENCOUNTER — Encounter: Payer: Self-pay | Admitting: Physical Therapy

## 2021-05-08 DIAGNOSIS — M5412 Radiculopathy, cervical region: Secondary | ICD-10-CM

## 2021-05-08 DIAGNOSIS — M6281 Muscle weakness (generalized): Secondary | ICD-10-CM

## 2021-05-08 NOTE — Therapy (Signed)
[facility] Physical Therapy [address] [place], [place], [address] Phone: [phone]   Fax:  [phone]  Physical Therapy Treatment  Patient Details  Name: David Graves MRN: [id number] Date of Birth: 04/05/1956 Referring Provider (PT): Kerrin Champagne, MD   Encounter Date: 05/08/2021   PT End of Session - 05/08/21 1138     Visit Number 5    Number of Visits 6    Date for PT Re-Evaluation 05/15/21    Authorization Type [employer]    PT Start Time 1100    PT Stop Time 1120    PT Time Calculation (min) 20 min    Activity Tolerance Patient tolerated treatment well    Behavior During Therapy [facility] for tasks assessed/performed             Past Medical History:  Diagnosis Date   Anxiety    Asthma    no inhaler   Atopic dermatitis    Bee sting    BMI 29.0-29.9,adult    Cervical radiculopathy    Chronic insomnia    Complaint of paresthesia    Elevated blood pressure reading    Epididymitis    Family history of prostate cancer    GERD (gastroesophageal reflux disease)    Gout    Heart murmur    Hypertension    Lateral epicondylitis of right elbow    Pure hypercholesterolemia    Seasonal allergic rhinitis due to pollen     Past Surgical History:  Procedure Laterality Date   COLONOSCOPY  10/20/2017   RADIOLOGY WITH ANESTHESIA N/A 10/17/2020   Procedure: MRI WITH ANESTHESIA  LUMBAR WITHOUT CONTRAST AND CERVICAL WITHOUT CONTRAST;  Surgeon: Radiologist, Medication, MD;  [facility]: [facility] OR;  Service: Radiology;  Laterality: N/A;   UPPER GI ENDOSCOPY     VASECTOMY     Per patient    There were no vitals filed for this visit.   Subjective Assessment - 05/08/21 1058     Subjective sees MD on Thursday; new episode yesterday of Lt arm going numb without known cause ("I was doing nothing") and eventually feeling returned but unsure of how/why    Limitations Lifting;Sitting;House hold activities    Diagnostic tests MRI: severe stenosis    Patient  Stated Goals hopeful for some pain relief, has to complete this before surgery    Currently in Pain? Yes    Pain Score 5     Pain Location Arm    Pain Orientation Distal;Right;Left    Pain Descriptors / Indicators Aching;Sore    Pain Type Chronic pain;Acute pain    Pain Onset More than a month ago    Pain Frequency Constant    Aggravating Factors  little/simple tasks    Pain Relieving Factors tylenol                [facility] PT Assessment - 05/08/21 1109       Assessment   Medical Diagnosis M48.02 (ICD-10-CM) - Spinal stenosis of cervical region  R20.2 (ICD-10-CM) - Paresthesias in right hand  R20.2 (ICD-10-CM) - Paresthesias in left hand    Referring Provider (PT) Kerrin Champagne, MD    Hand Dominance Right    Next MD Visit 05/10/21      Observation/Other Assessments   Focus on Therapeutic Outcomes (FOTO)  45      AROM   Cervical - Right Rotation 78   with ipsalateral pain   Cervical - Left Rotation 74   with ipsalateral pain  Strength   Right Shoulder Flexion 4/5    Right Shoulder ABduction 4/5    Left Shoulder Flexion 3+/5    Left Shoulder ABduction 4/5    Right Hand Grip (lbs) 52#    Left Hand Grip (lbs) 46.5#                           [facility] Adult PT Treatment/Exercise - 05/08/21 0001       Self-Care   Other Self-Care Comments  reviewed HEP with pt and encouraged continued use if not increasing symptoms                       PT Short Term Goals - 04/24/21 1118       PT SHORT TERM GOAL #1   Title independent with initial HEP    Time 3    Period Weeks    Status Achieved    Target Date 04/24/21               PT Long Term Goals - 05/08/21 1139       PT LONG TERM GOAL #1   Title Independent with final HEP    Time 6    Period Weeks    Status On-going    Target Date 05/15/21      PT LONG TERM GOAL #2   Title FOTO score improved to 48  for improved function    Time 6    Period Weeks    Status On-going    Target  Date 05/15/21      PT LONG TERM GOAL #3   Title Cervical rotation AROM improved to 60 deg for improved mobility    Time 6    Period Weeks    Status Achieved      PT LONG TERM GOAL #4   Title Report pain < 4/10 for improved function    Time 6    Period Weeks    Status On-going    Target Date 05/15/21      PT LONG TERM GOAL #5   Title Demonstrate at least 65# grip strength in bil hands for improved function    Time 6    Period Weeks    Status On-going    Target Date 05/15/21                   Plan - 05/08/21 1407     Clinical Impression Statement Goals assessed today in preparation for MD visit later this week.  Pt is compliant with HEP and does have some improvement in cervical rotation but continues to have elevated levels of pain with changes in sensation.  He is demonstrating decreasing grip strength on Lt and Rt compared to initial eval.  Plan to hold PT and await further recommendations from MD.  Pt has exhausted all PT options at this time.    Personal Factors and Comorbidities Comorbidity 3+;Time since onset of injury/illness/exacerbation    Comorbidities HTN, HLD, anxiety    Examination-Activity Limitations Carry;Lift;Reach Overhead    Examination-Participation Restrictions Community Activity;Cleaning;Occupation    Stability/Clinical Decision Making Evolving/Moderate complexity    Rehab Potential Good    PT Frequency 1x / week    PT Duration 6 weeks    PT Treatment/Interventions ADLs/Self Care Home Management;Cryotherapy;Electrical Stimulation;Moist Heat;Traction;Therapeutic exercise;Therapeutic activities;Neuromuscular re-education;Patient/family education;Manual techniques;Taping;Dry needling;Passive range of motion    PT Next Visit Plan see what MD says. (will likely be d/c)  PT Home Exercise Plan Access Code: [email or web address]    Consulted and Agree with Plan of Care Patient             Patient will benefit from skilled therapeutic intervention in order to  improve the following deficits and impairments:  Decreased strength, Pain, Impaired UE functional use, Increased muscle spasms, Decreased range of motion, Impaired flexibility, Postural dysfunction  Visit Diagnosis: Radiculopathy, cervical region  Muscle weakness (generalized)     Problem List Patient Active Problem List   Diagnosis Date Noted   GERD (gastroesophageal reflux disease) 06/28/2020   Hyperlipidemia 06/28/2020   Hypertension 06/28/2020   Bilateral hand pain 06/28/2020   Rheumatoid factor positive 06/28/2020   Anxiety 12/09/2018      Clarita Crane, PT, DPT 05/08/21 2:14 PM     [facility] Physical Therapy [address] [place], [place], [address] Phone: [phone]   Fax:  [phone]  Name: Jaylun Fleener MRN: [id number] Date of Birth: April 12, 1956

## 2021-05-10 ENCOUNTER — Other Ambulatory Visit: Payer: Self-pay

## 2021-05-10 ENCOUNTER — Encounter: Payer: Self-pay | Admitting: Specialist

## 2021-05-10 ENCOUNTER — Ambulatory Visit (INDEPENDENT_AMBULATORY_CARE_PROVIDER_SITE_OTHER): Payer: 59 | Admitting: Specialist

## 2021-05-10 VITALS — BP 125/73 | HR 57 | Ht 66.0 in | Wt 170.0 lb

## 2021-05-10 DIAGNOSIS — M542 Cervicalgia: Secondary | ICD-10-CM

## 2021-05-15 ENCOUNTER — Ambulatory Visit (INDEPENDENT_AMBULATORY_CARE_PROVIDER_SITE_OTHER): Payer: 59 | Admitting: Physical Therapy

## 2021-05-15 ENCOUNTER — Other Ambulatory Visit: Payer: Self-pay

## 2021-05-15 ENCOUNTER — Encounter: Payer: Self-pay | Admitting: Physical Therapy

## 2021-05-15 DIAGNOSIS — M5412 Radiculopathy, cervical region: Secondary | ICD-10-CM | POA: Diagnosis not present

## 2021-05-15 DIAGNOSIS — M6281 Muscle weakness (generalized): Secondary | ICD-10-CM

## 2021-05-15 NOTE — Therapy (Addendum)
[facility] [address] [place], [place], [address] Phone: [phone]   Fax:  [phone]  Physical Therapy Treatment / Discharge  Patient Details  Name: David Graves MRN: [id number] Date of Birth: 02/13/1956 Referring Provider (PT): Nitka, James E, MD   Encounter Date: 05/15/2021   PT End of Session - 05/15/21 1113     Visit Number 6    Number of Visits 6    Date for PT Re-Evaluation 05/15/21    Authorization Type [employer]    PT Start Time 1100    PT Stop Time 1110    PT Time Calculation (min) 10 min    Activity Tolerance Patient tolerated treatment well    Behavior During Therapy WFL for tasks assessed/performed             Past Medical History:  Diagnosis Date   Anxiety    Asthma    no inhaler   Atopic dermatitis    Bee sting    BMI 29.0-29.9,adult    Cervical radiculopathy    Chronic insomnia    Complaint of paresthesia    Elevated blood pressure reading    Epididymitis    Family history of prostate cancer    GERD (gastroesophageal reflux disease)    Gout    Heart murmur    Hypertension    Lateral epicondylitis of right elbow    Pure hypercholesterolemia    Seasonal allergic rhinitis due to pollen     Past Surgical History:  Procedure Laterality Date   COLONOSCOPY  10/20/2017   RADIOLOGY WITH ANESTHESIA N/A 10/17/2020   Procedure: MRI WITH ANESTHESIA  LUMBAR WITHOUT CONTRAST AND CERVICAL WITHOUT CONTRAST;  Surgeon: Radiologist, Medication, MD;  Location: [facility] OR;  Service: Radiology;  Laterality: N/A;   UPPER GI ENDOSCOPY     VASECTOMY     Per patient    There were no vitals filed for this visit.   Subjective Assessment - 05/15/21 1101     Subjective wasn't able to see MD last week because of wait time.    Limitations Lifting;Sitting;House hold activities    Diagnostic tests MRI: severe stenosis    Patient Stated Goals hopeful for some pain relief, has to complete this before surgery    Currently in  Pain? Yes    Pain Score 5     Pain Location Arm    Pain Orientation Right;Left    Pain Descriptors / Indicators Aching;Sore    Pain Type Chronic pain;Acute pain    Pain Onset More than a month ago    Pain Frequency Constant    Aggravating Factors  activity in general    Pain Relieving Factors tylenol             Values are from 05/08/21 visit. No change today.   [facility] PT Assessment - 05/15/21 0001       Assessment   Medical Diagnosis M48.02 (ICD-10-CM) - Spinal stenosis of cervical region  R20.2 (ICD-10-CM) - Paresthesias in right hand  R20.2 (ICD-10-CM) - Paresthesias in left hand    Referring Provider (PT) Nitka, James E, MD    Hand Dominance Right    Next MD Visit 05/10/21      Observation/Other Assessments   Focus on Therapeutic Outcomes (FOTO)  45      AROM   Cervical - Right Rotation 78   with ipsalateral pain   Cervical - Left Rotation 74   with ipsalateral pain     Strength   Right Shoulder   Flexion 4/5    Right Shoulder ABduction 4/5    Left Shoulder Flexion 3+/5    Left Shoulder ABduction 4/5    Right Hand Grip (lbs) 52#    Left Hand Grip (lbs) 46.5#                           [facility] Adult PT Treatment/Exercise - 05/15/21 1112       Self-Care   Other Self-Care Comments  reviewed current status and lack of progress as well as clinical findings last visit.  recommended he keep MD appt that's scheduled, and will d/c PT today due to lack of progress.                       PT Short Term Goals - 04/24/21 1118       PT SHORT TERM GOAL #1   Title independent with initial HEP    Time 3    Period Weeks    Status Achieved    Target Date 04/24/21               PT Long Term Goals - 05/15/21 1123       PT LONG TERM GOAL #1   Title Independent with final HEP    Time 6    Period Weeks    Status Achieved      PT LONG TERM GOAL #2   Title FOTO score improved to 48  for improved function    Time 6    Period Weeks     Status Not Met      PT LONG TERM GOAL #3   Title Cervical rotation AROM improved to 60 deg for improved mobility    Time 6    Period Weeks    Status Achieved      PT LONG TERM GOAL #4   Title Report pain < 4/10 for improved function    Time 6    Period Weeks    Status Not Met      PT LONG TERM GOAL #5   Title Demonstrate at least 65# grip strength in bil hands for improved function    Time 6    Period Weeks    Status Not Met                   Plan - 05/15/21 1123     Clinical Impression Statement Pt to d/c from PT today due to lack of progress and no change in symptoms at this time.  He is working on HEP but at this time is not beneficial or helping with his symptoms.  Will d/c and pt to return to MD.    Personal Factors and Comorbidities Comorbidity 3+;Time since onset of injury/illness/exacerbation    Comorbidities HTN, HLD, anxiety    Examination-Activity Limitations Carry;Lift;Reach Overhead    Examination-Participation Restrictions Community Activity;Cleaning;Occupation    Stability/Clinical Decision Making Evolving/Moderate complexity    Rehab Potential Good    PT Frequency 1x / week    PT Duration 6 weeks    PT Treatment/Interventions ADLs/Self Care Home Management;Cryotherapy;Electrical Stimulation;Moist Heat;Traction;Therapeutic exercise;Therapeutic activities;Neuromuscular re-education;Patient/family education;Manual techniques;Taping;Dry needling;Passive range of motion    PT Next Visit Plan d/c PT today    PT Home Exercise Plan Access Code: [email or web address]    Consulted and Agree with Plan of Care Patient             Patient will benefit from skilled therapeutic   intervention in order to improve the following deficits and impairments:  Decreased strength, Pain, Impaired UE functional use, Increased muscle spasms, Decreased range of motion, Impaired flexibility, Postural dysfunction  Visit Diagnosis: Radiculopathy, cervical region  Muscle weakness  (generalized)     Problem List Patient Active Problem List   Diagnosis Date Noted   GERD (gastroesophageal reflux disease) 06/28/2020   Hyperlipidemia 06/28/2020   Hypertension 06/28/2020   Bilateral hand pain 06/28/2020   Rheumatoid factor positive 06/28/2020   Anxiety 12/09/2018      Laureen Abrahams, PT, DPT 05/15/21 11:25 AM    [facility] [address] [place], [place], [address] Phone: [phone]   Fax:  [phone]  Name: David Graves MRN: [id number] Date of Birth: 10-14-1955    PHYSICAL THERAPY DISCHARGE SUMMARY  Visits from Start of Care: 6  Current functional level related to goals / functional outcomes: See above   Remaining deficits: See above   Education / Equipment: HEP   Patient agrees to discharge. Patient goals were not met. Patient is being discharged due to lack of progress.  Laureen Abrahams, PT, DPT 05/15/21 11:26 AM  [facility] [address] [place], [place], [address] Phone: [phone]   Fax:  [phone]

## 2021-05-22 ENCOUNTER — Encounter: Payer: 59 | Admitting: Physical Therapy

## 2021-06-14 ENCOUNTER — Ambulatory Visit (INDEPENDENT_AMBULATORY_CARE_PROVIDER_SITE_OTHER): Payer: 59 | Admitting: Specialist

## 2021-06-14 ENCOUNTER — Other Ambulatory Visit: Payer: Self-pay

## 2021-06-14 DIAGNOSIS — M542 Cervicalgia: Secondary | ICD-10-CM

## 2021-06-29 ENCOUNTER — Ambulatory Visit (INDEPENDENT_AMBULATORY_CARE_PROVIDER_SITE_OTHER): Payer: 59 | Admitting: Orthopaedic Surgery

## 2021-06-29 ENCOUNTER — Encounter: Payer: Self-pay | Admitting: Orthopaedic Surgery

## 2021-06-29 ENCOUNTER — Other Ambulatory Visit: Payer: Self-pay

## 2021-06-29 DIAGNOSIS — M4802 Spinal stenosis, cervical region: Secondary | ICD-10-CM | POA: Diagnosis not present

## 2021-06-29 DIAGNOSIS — M4722 Other spondylosis with radiculopathy, cervical region: Secondary | ICD-10-CM

## 2021-07-02 DIAGNOSIS — M4802 Spinal stenosis, cervical region: Secondary | ICD-10-CM | POA: Insufficient documentation

## 2021-07-02 DIAGNOSIS — M4722 Other spondylosis with radiculopathy, cervical region: Secondary | ICD-10-CM | POA: Insufficient documentation

## 2021-07-02 NOTE — Progress Notes (Signed)
Office Visit Note   Patient: David Graves           Date of Birth: 1956/06/08           MRN: [id number] Visit Date: 06/29/2021              Requested by: Mila Palmer, MD [address] [place],  [place] [address] PCP: Mila Palmer, MD   Assessment & Plan: Visit Diagnoses:  1. Other spondylosis with radiculopathy, cervical region   2. Foraminal stenosis of cervical region     Plan: We reviewed the cervical MRI discussed surgical options which would include fusion at C4-5, C5-6 and C6-7 where he has more severe foraminal stenosis and spondylosis.  We discussed risks of pseudoarthrosis, dysphagia, dysphonia, use of a soft cervical collar for 6 weeks.  Potential for additional immobilization if 1 level was slow to incorporate and heal.  Potential for posterior fusion needed if he develops symptomatic pseudoarthrosis.  Questions were elicited and answered.  He states he like to consider this but needs to get some new insurance at the beginning of the year.  Follow-Up Instructions: No follow-ups on file.   Orders:  No orders of the defined types were placed in this encounter.  No orders of the defined types were placed in this encounter.     Procedures: No procedures performed   Clinical Data: No additional findings.   Subjective: Chief Complaint  Patient presents with   Neck - Pain   Lower Back - Pain    HPI 65 year old male returns have been seen by Dr. Otelia Sergeant and three-level cervical fusion C4-C7 with allograft and plate it been recommended.  Patient's been through physical therapy after insurance denied proceeding until he completed therapy.  He has had therapy and got no relief.  He has pain worse in the left arm than right arm pain that radiates down to the dorsum of the left hand.  He notes some weakness in his arm at times he is used Tylenol.  He did get some temporary relief with prednisone Dosepak.  MRI scan findings are listed below which  showed multilevel central and foraminal compression.  Electrical test showed evidence of mild bilateral carpal tunnel syndrome.  Patient has been treated for GERD hypertension and hyperlipidemia.  He has rheumatoid factor positive.  Sed rate normal at 2.  No myelopathic symptoms. Patient has moderate to severe stenosis at L3-4 by MRI 10/17/2020. Review of Systems all other systems noncontributory HPI.   Objective: Vital Signs: BP 134/78   Ht 5\' 7"  (1.702 m)   Wt 172 lb (78 kg)   BMI 26.94 kg/m   Physical Exam Constitutional:      Appearance: He is well-developed.  HENT:     Head: Normocephalic and atraumatic.     Right Ear: External ear normal.     Left Ear: External ear normal.  Eyes:     Pupils: Pupils are equal, round, and reactive to light.  Neck:     Thyroid: No thyromegaly.     Trachea: No tracheal deviation.  Cardiovascular:     Rate and Rhythm: Normal rate.  Pulmonary:     Effort: Pulmonary effort is normal.     Breath sounds: No wheezing.  Abdominal:     General: Bowel sounds are normal.     Palpations: Abdomen is soft.  Musculoskeletal:     Cervical back: Neck supple.  Skin:    General: Skin is warm and dry.  Capillary Refill: Capillary refill takes less than 2 seconds.  Neurological:     Mental Status: He is alert and oriented to person, place, and time.  Psychiatric:        Behavior: Behavior normal.        Thought Content: Thought content normal.        Judgment: Judgment normal.    Ortho Exam patient has brachial plexus tenderness worse on left than right positive Spurling moderate on the left negative on the right.  Upper extremity reflexes are 2+ and symmetrical.  Mild discomfort with carpal compression left and right.  Decreased sensation to C6-C7 left hand.  No lower extremity clonus no hyperreflexia quads hamstrings ankle dorsiflexion plantarflexion is strong.  Specialty Comments:  No specialty comments available.  Imaging: CLINICAL DATA:   Neck pain, chronic, degenerative changes on xray Spinal stenosis, C-spine Cervical radiculopathy, no red flags   EXAM: MRI CERVICAL SPINE WITHOUT CONTRAST   TECHNIQUE: Multiplanar, multisequence MR imaging of the cervical spine was performed. No intravenous contrast was administered.   COMPARISON:  09/27/2020 and prior.   FINDINGS: Alignment: Straightening of lordosis.   Vertebrae: Mild bone marrow heterogeneity without focal lesion. Modic type 2 endplate degenerative changes. Vertebral body heights are preserved.   Cord: Normal signal and morphology.   Posterior Fossa, vertebral arteries: Negative.   Disc levels: Multilevel desiccation.   C2-3: Uncovertebral and facet hypertrophy. Moderate right neural foraminal narrowing. Patent spinal canal and left neural foramen.   C3-4: Disc osteophyte complex with uncovertebral and facet hypertrophy. Patent spinal canal. Mild right and severe left neural foraminal narrowing.   C4-5: Disc osteophyte complex partially effacing the CSF containing spaces. Uncovertebral and facet hypertrophy. Mild spinal canal and bilateral neural foraminal narrowing.   C5-6: Disc osteophyte complex with uncovertebral and facet hypertrophy. Mild spinal canal, mild right and moderate left neural foraminal narrowing.   C6-7: Disc osteophyte complex with left predominant uncovertebral and facet hypertrophy. Patent spinal canal and right neural foramen. Severe left neural foraminal narrowing.   C7-T1: No significant disc bulge. Left predominant uncovertebral and facet hypertrophy. Patent spinal canal and right neural foramen. Mild left neural foraminal narrowing.   Paraspinal tissues: Negative.   IMPRESSION: Multilevel spondylosis.  Mild C4-5, C5-6 spinal canal narrowing.   Severe left C3-4, moderate left C5-6 and severe left C6-7 neural foraminal narrowing.     Electronically Signed   By: Stana Bunting M.D.   On: 10/17/2020  15:13   PMFS History: Patient Active Problem List   Diagnosis Date Noted   Other spondylosis with radiculopathy, cervical region 07/02/2021   Foraminal stenosis of cervical region 07/02/2021   GERD (gastroesophageal reflux disease) 06/28/2020   Hyperlipidemia 06/28/2020   Hypertension 06/28/2020   Bilateral hand pain 06/28/2020   Rheumatoid factor positive 06/28/2020   Anxiety 12/09/2018   Past Medical History:  Diagnosis Date   Anxiety    Asthma    no inhaler   Atopic dermatitis    Bee sting    BMI 29.0-29.9,adult    Cervical radiculopathy    Chronic insomnia    Complaint of paresthesia    Elevated blood pressure reading    Epididymitis    Family history of prostate cancer    GERD (gastroesophageal reflux disease)    Gout    Heart murmur    Hypertension    Lateral epicondylitis of right elbow    Pure hypercholesterolemia    Seasonal allergic rhinitis due to pollen     Family History  Problem Relation Age of Onset   Healthy Son    Healthy Son    Lupus Niece     Past Surgical History:  Procedure Laterality Date   COLONOSCOPY  10/20/2017   RADIOLOGY WITH ANESTHESIA N/A 10/17/2020   Procedure: MRI WITH ANESTHESIA  LUMBAR WITHOUT CONTRAST AND CERVICAL WITHOUT CONTRAST;  Surgeon: Radiologist, Medication, MD;  Location: [facility] OR;  Service: Radiology;  Laterality: N/A;   UPPER GI ENDOSCOPY     VASECTOMY     Per patient   Social History   Occupational History   Not on file  Tobacco Use   Smoking status: Former    Types: Cigarettes    Quit date: 05/16/2012    Years since quitting: 9.1   Smokeless tobacco: Never  Vaping Use   Vaping Use: Never used  Substance and Sexual Activity   Alcohol use: Not Currently   Drug use: Yes    Frequency: 4.0 times per week    Types: Marijuana    Comment: uses daily   Sexual activity: Yes

## 2021-07-15 IMAGING — MR MR CERVICAL SPINE W/O CM
4 of 5 series · 19 of 48 positions shown · non-contrast
Comparison: 09/27/2020 and prior.

CLINICAL DATA: Neck pain, chronic, degenerative changes on xray
Spinal stenosis, C-spine Cervical radiculopathy, no red flags

EXAM:
MRI CERVICAL SPINE WITHOUT CONTRAST
TECHNIQUE: Multiplanar, multisequence MR imaging of the cervical spine was
performed. No intravenous contrast was administered.

[Series 2: T2 · sagittal · 3.0mm · 0.43mm/px · 5 of 15 slices shown (1 of 2)]
[im 1/15]
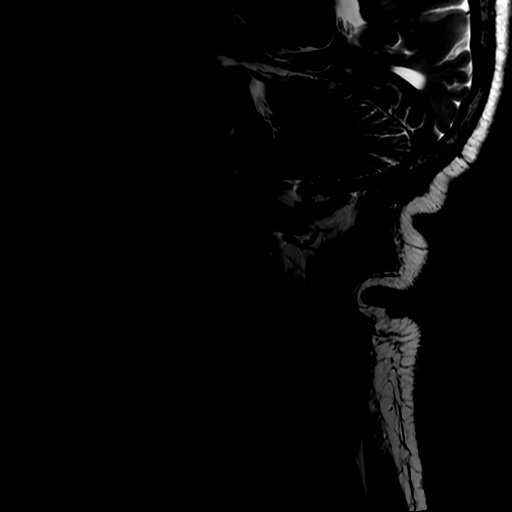
[im 4/15]
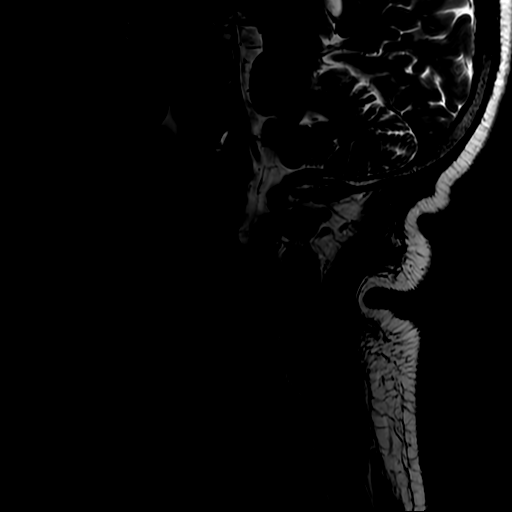
[im 8/15]
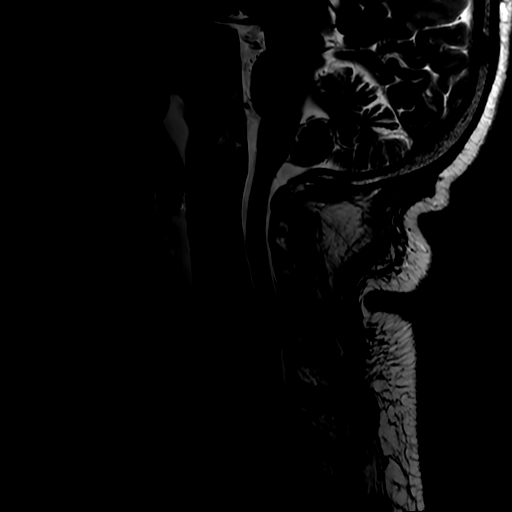
[im 11/15]
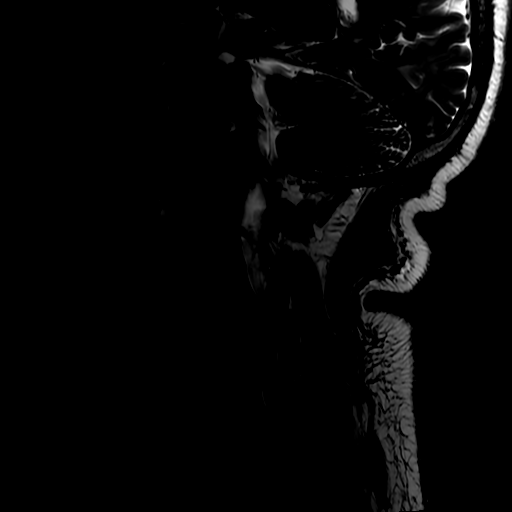
[im 15/15]
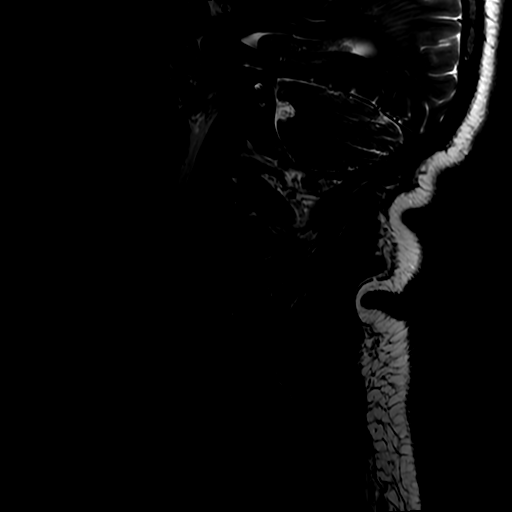

[Series 3: FLAIR · sagittal · 3.0mm · 0.43mm/px · 3 of 15 slices shown]
[im 1/15]
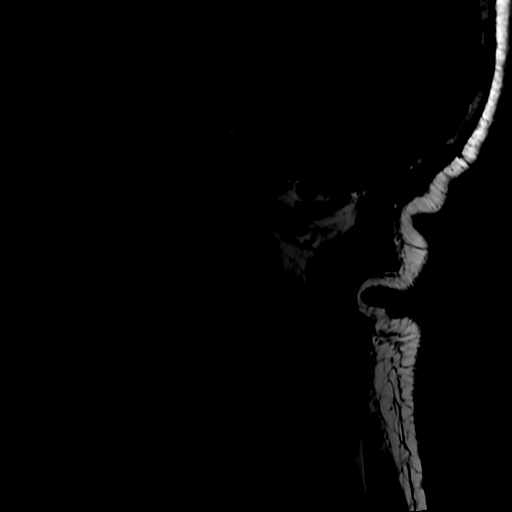
[im 10/15]
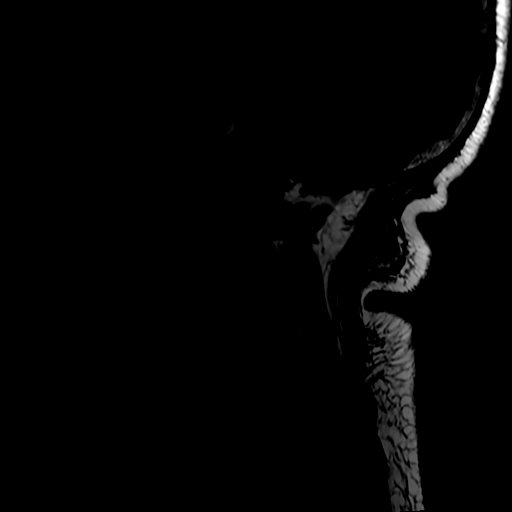
[im 15/15]
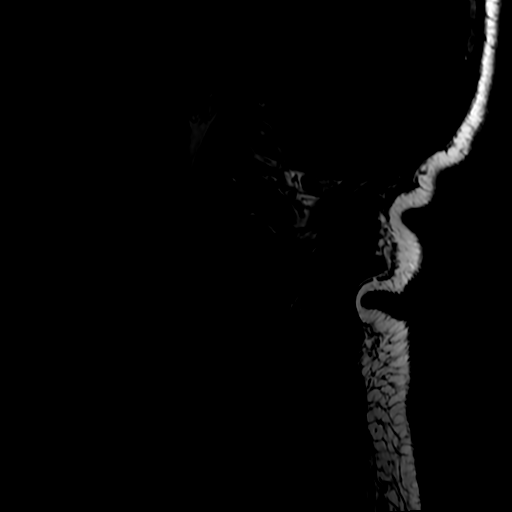

[Series 4: STIR · sagittal · 3.0mm · 0.43mm/px · 3 of 15 slices shown]
[im 1/15]
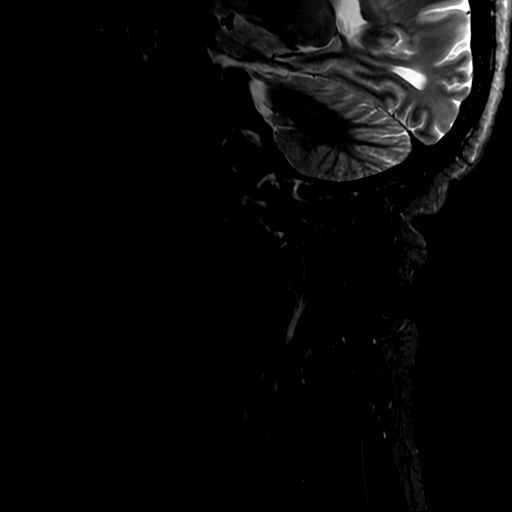
[im 10/15]
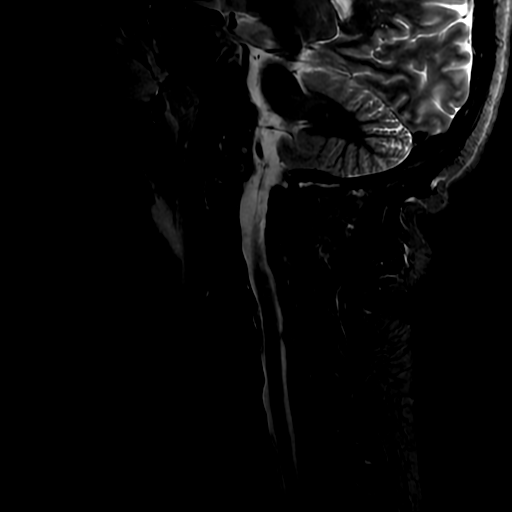
[im 15/15]
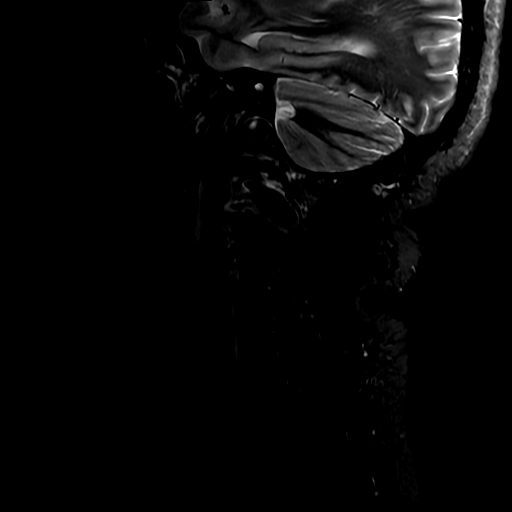

[Series 6: T2 · axial · 3.0mm · 0.35mm/px · z∈[-87,-3]mm · 8 of 32 slices shown (2 of 2)]
[im 1/32]
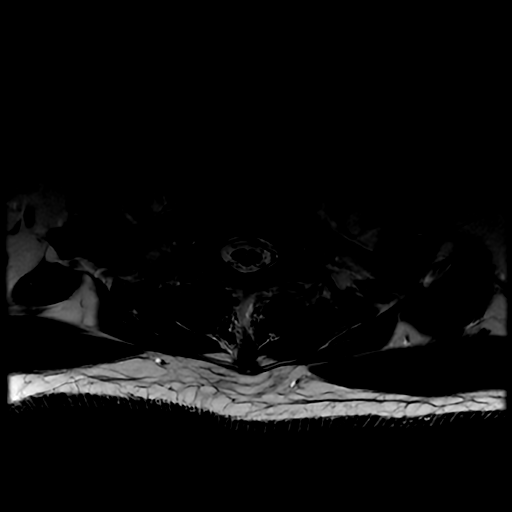
[im 4/32]
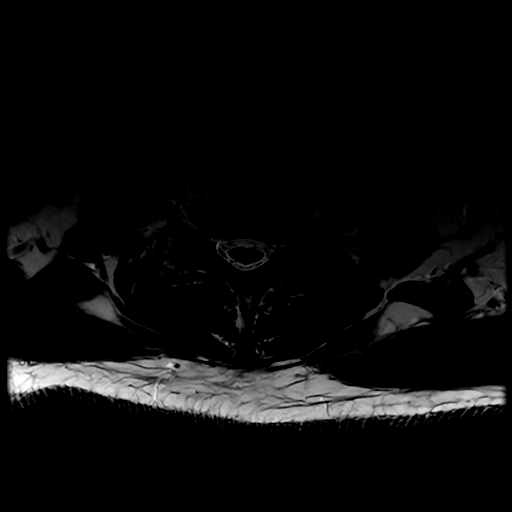
[im 8/32]
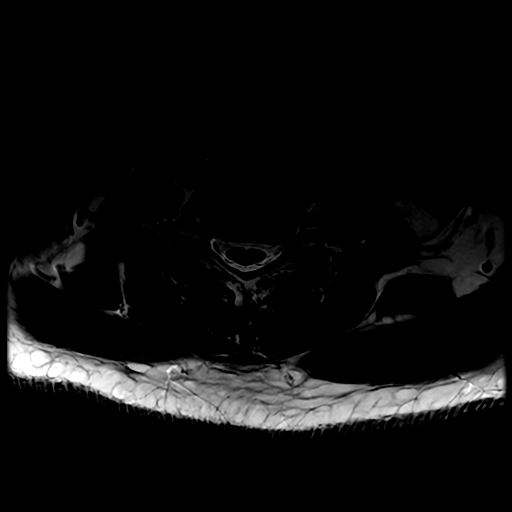
[im 12/32]
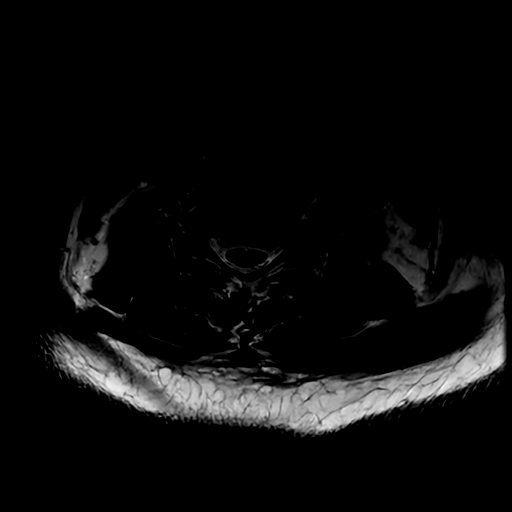
[im 16/32]
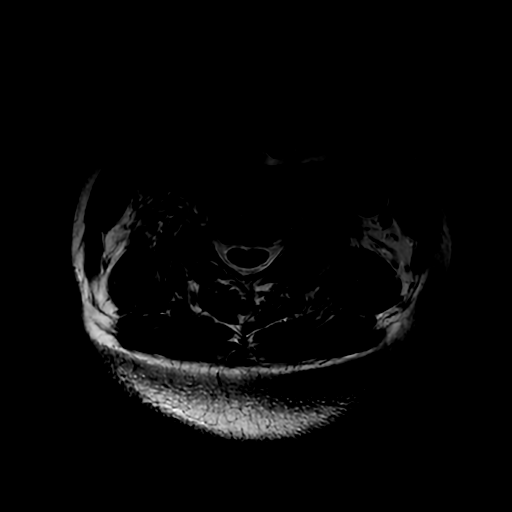
[im 20/32]
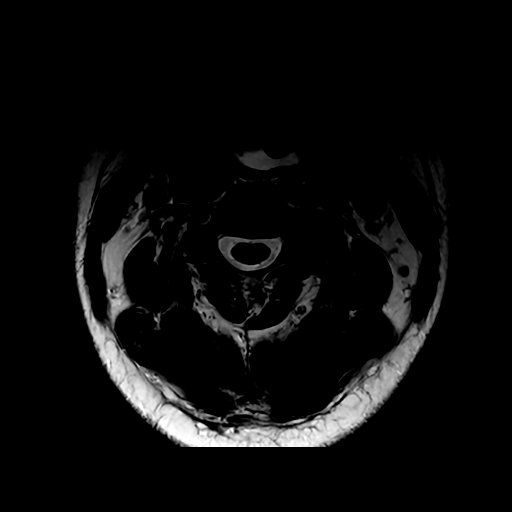
[im 24/32]
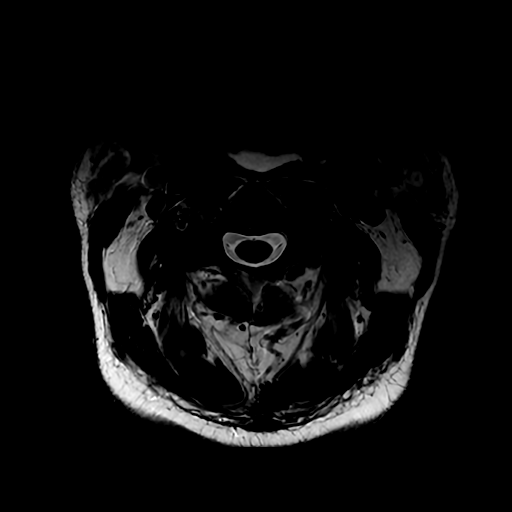
[im 28/32]
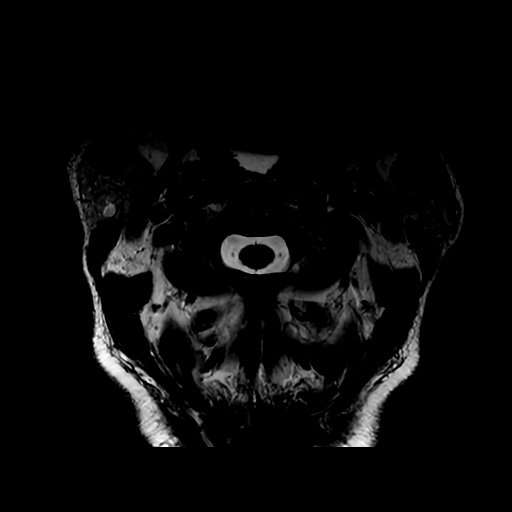

[19 of 48 positions shown; findings below may reference images not displayed]

FINDINGS: Alignment: Straightening of lordosis.

Vertebrae: Mild bone marrow heterogeneity without focal lesion.
Modic type 2 endplate degenerative changes. Vertebral body heights
are preserved.

Cord: Normal signal and morphology.

Posterior Fossa, vertebral arteries: Negative.

Disc levels: Multilevel desiccation.

C2-3: Uncovertebral and facet hypertrophy. Moderate right neural
foraminal narrowing. Patent spinal canal and left neural foramen.

C3-4: Disc osteophyte complex with uncovertebral and facet
hypertrophy. Patent spinal canal. Mild right and severe left neural
foraminal narrowing.

C4-5: Disc osteophyte complex partially effacing the CSF containing
spaces. Uncovertebral and facet hypertrophy. Mild spinal canal and
bilateral neural foraminal narrowing.

C5-6: Disc osteophyte complex with uncovertebral and facet
hypertrophy. Mild spinal canal, mild right and moderate left neural
foraminal narrowing.

C6-7: Disc osteophyte complex with left predominant uncovertebral
and facet hypertrophy. Patent spinal canal and right neural foramen.
Severe left neural foraminal narrowing.

C7-T1: No significant disc bulge. Left predominant uncovertebral and
facet hypertrophy. Patent spinal canal and right neural foramen.
Mild left neural foraminal narrowing.

Paraspinal tissues: Negative.
IMPRESSION: Multilevel spondylosis.  Mild C4-5, C5-6 spinal canal narrowing.

Severe left C3-4, moderate left C5-6 and severe left C6-7 neural
foraminal narrowing.

## 2021-07-30 NOTE — Progress Notes (Signed)
Surgical Instructions    Your procedure is scheduled on Monday, December 12th, 2022.   Report to [facility] Main Entrance "A" at 10:30 A.M., then check in with the Admitting office.  Call this number if you have problems the morning of surgery:  [phone]   If you have any questions prior to your surgery date call [phone]: Open Monday-Friday 8am-4pm    Remember:  Do not eat after midnight the night before your surgery  You may drink clear liquids until 09:30 the morning of your surgery.   Clear liquids allowed are: Water, Non-Citrus Juices (without pulp), Carbonated Beverages, Clear Tea, Black Coffee ONLY (NO MILK, CREAM OR POWDERED CREAMER of any kind), and Gatorade    Take these medicines the morning of surgery with A SIP OF WATER:  amLODipine (NORVASC)  atorvastatin (LIPITOR) cetirizine (ZYRTEC)  pantoprazole (PROTONIX)   If needed:  acetaminophen (TYLENOL) EPINEPHrine hydrOXYzine (ATARAX/VISTARIL)   Follow your surgeon's instructions on when to stop Aspirin.  If no instructions were given by your surgeon then you will need to call the office to get those instructions.     As of today, STOP taking any Aspirin (unless otherwise instructed by your surgeon) Aleve, Naproxen, Ibuprofen, Motrin, Advil, Goody's, BC's, all herbal medications, fish oil, and all vitamins.   After your COVID test   You are not required to quarantine however you are required to wear a well-fitting mask when you are out and around people not in your household.  If your mask becomes wet or soiled, replace with a new one.  Wash your hands often with soap and water for 20 seconds or clean your hands with an alcohol-based hand sanitizer that contains at least 60% alcohol.  Do not share personal items.  Notify your provider: if you are in close contact with someone who has COVID  or if you develop a fever of 100.4 or greater, sneezing, cough, sore throat, shortness of breath or body  aches.    The day of surgery:          Do not wear jewelry  Do not wear lotions, powders, colognes, or deodorant. Men may shave face and neck. Do not bring valuables to the hospital.              [facility] is not responsible for any belongings or valuables.  Do NOT Smoke (Tobacco/Vaping)  24 hours prior to your procedure  If you use a CPAP at night, you may bring your mask for your overnight stay.   Contacts, glasses, hearing aids, dentures or partials may not be worn into surgery, please bring cases for these belongings   For patients admitted to the hospital, discharge time will be determined by your treatment team.   Patients discharged the day of surgery will not be allowed to drive home, and someone needs to stay with them for 24 hours.  NO VISITORS WILL BE ALLOWED IN PRE-OP WHERE PATIENTS ARE PREPPED FOR SURGERY.  ONLY 1 SUPPORT PERSON MAY BE PRESENT IN THE WAITING ROOM WHILE YOU ARE IN SURGERY.  IF YOU ARE TO BE ADMITTED, ONCE YOU ARE IN YOUR ROOM YOU WILL BE ALLOWED TWO (2) VISITORS. 1 (ONE) VISITOR MAY STAY OVERNIGHT BUT MUST ARRIVE TO THE ROOM BY 8pm.  Minor children may have two parents present. Special consideration for safety and communication needs will be reviewed on a case by case basis.  Special instructions:    Oral Hygiene is also important to reduce your risk of  infection.  Remember - BRUSH YOUR TEETH THE MORNING OF SURGERY WITH YOUR REGULAR TOOTHPASTE   [place]- Preparing For Surgery  Before surgery, you can play an important role. Because skin is not sterile, your skin needs to be as free of germs as possible. You can reduce the number of germs on your skin by washing with CHG (chlorahexidine gluconate) Soap before surgery.  CHG is an antiseptic cleaner which kills germs and bonds with the skin to continue killing germs even after washing.     Please do not use if you have an allergy to CHG or antibacterial soaps. If your skin becomes reddened/irritated  stop using the CHG.  Do not shave (including legs and underarms) for at least 48 hours prior to first CHG shower. It is OK to shave your face.  Please follow these instructions carefully.     Shower the NIGHT BEFORE SURGERY and the MORNING OF SURGERY with CHG Soap.   If you chose to wash your hair, wash your hair first as usual with your normal shampoo. After you shampoo, rinse your hair and body thoroughly to remove the shampoo.  Then [employer] and genitals (private parts) with your normal soap and rinse thoroughly to remove soap.  After that Use CHG Soap as you would any other liquid soap. You can apply CHG directly to the skin and wash gently with a scrungie or a clean washcloth.   Apply the CHG Soap to your body ONLY FROM THE NECK DOWN.  Do not use on open wounds or open sores. Avoid contact with your eyes, ears, mouth and genitals (private parts). Wash Face and genitals (private parts)  with your normal soap.   Wash thoroughly, paying special attention to the area where your surgery will be performed.  Thoroughly rinse your body with warm water from the neck down.  DO NOT shower/wash with your normal soap after using and rinsing off the CHG Soap.  Pat yourself dry with a CLEAN TOWEL.  Wear CLEAN PAJAMAS to bed the night before surgery  Place CLEAN SHEETS on your bed the night before your surgery  DO NOT SLEEP WITH PETS.   Day of Surgery:  Take a shower with CHG soap. Wear Clean/Comfortable clothing the morning of surgery Do not apply any deodorants/lotions.   Remember to brush your teeth WITH YOUR REGULAR TOOTHPASTE.   Please read over the following fact sheets that you were given.

## 2021-07-31 ENCOUNTER — Encounter (HOSPITAL_COMMUNITY): Payer: Self-pay

## 2021-07-31 ENCOUNTER — Encounter (HOSPITAL_COMMUNITY)
Admission: RE | Admit: 2021-07-31 | Discharge: 2021-07-31 | Disposition: A | Discharge status: Home / Self Care | Payer: 59 | Source: Ambulatory Visit | Attending: Orthopaedic Surgery | Admitting: Orthopaedic Surgery

## 2021-07-31 ENCOUNTER — Other Ambulatory Visit: Payer: Self-pay

## 2021-07-31 VITALS — BP 136/73 | HR 65 | Temp 97.9°F | Resp 17 | Ht 67.0 in | Wt 178.1 lb

## 2021-07-31 DIAGNOSIS — Z01818 Encounter for other preprocedural examination: Secondary | ICD-10-CM

## 2021-07-31 DIAGNOSIS — Z01812 Encounter for preprocedural laboratory examination: Secondary | ICD-10-CM | POA: Diagnosis present

## 2021-07-31 LAB — SURGICAL PCR SCREEN
MRSA, PCR: NEGATIVE
Staphylococcus aureus: NEGATIVE

## 2021-07-31 LAB — CBC
HCT: 48 % (ref 39.0–52.0)
Hemoglobin: 15.8 g/dL (ref 13.0–17.0)
MCH: 31.4 pg (ref 26.0–34.0)
MCHC: 32.9 g/dL (ref 30.0–36.0)
MCV: 95.4 fL (ref 80.0–100.0)
Platelets: 242 10*3/uL (ref 150–400)
RBC: 5.03 MIL/uL (ref 4.22–5.81)
RDW: 13.9 % (ref 11.5–15.5)
WBC: 8.5 10*3/uL (ref 4.0–10.5)
nRBC: 0 % (ref 0.0–0.2)

## 2021-07-31 LAB — COMPREHENSIVE METABOLIC PANEL
ALT: 21 U/L (ref 0–44)
AST: 21 U/L (ref 15–41)
Albumin: 4.2 g/dL (ref 3.5–5.0)
Alkaline Phosphatase: 79 U/L (ref 38–126)
Anion gap: 10 (ref 5–15)
BUN: 13 mg/dL (ref 8–23)
CO2: 26 mmol/L (ref 22–32)
Calcium: 9.5 mg/dL (ref 8.9–10.3)
Chloride: 104 mmol/L (ref 98–111)
Creatinine, Ser: 0.85 mg/dL (ref 0.61–1.24)
GFR, Estimated: >60 mL/min (ref >60)
Glucose, Bld: 105 mg/dL — ABNORMAL HIGH (ref 70–99)
Potassium: 3.7 mmol/L (ref 3.5–5.1)
Sodium: 140 mmol/L (ref 135–145)
Total Bilirubin: 0.9 mg/dL (ref 0.3–1.2)
Total Protein: 7.3 g/dL (ref 6.5–8.1)

## 2021-07-31 LAB — TYPE AND SCREEN
ABO/RH(D): A POS
Antibody Screen: NEGATIVE

## 2021-07-31 NOTE — Progress Notes (Signed)
PCP - Mila Palmer, MD Cardiologist - denies  PPM/ICD - denies Device Orders - n/a Rep Notified - n/a  Chest x-ray - n/a EKG - 07?22/2022 Stress Test - 07/18/2020 ECHO - 04/05/2020 Cardiac Cath - denies  Sleep Study - denies CPAP - n/a  Fasting Blood Sugar - n/a   Blood Thinner Instructions: n/a  Aspirin Instructions: Aspirin - last day - 07/26/2021 per patient  Patient was instructed: As of today, STOP taking any Aspirin (unless otherwise instructed by your surgeon) Aleve, Naproxen, Ibuprofen, Motrin, Advil, Goody's, BC's, all herbal medications, fish oil, and all vitamins.    ERAS Protcol - yes PRE-SURGERY Ensure or G2- no  COVID TEST- the test was scheduled for 08/03/21 @ 09:15. Patient verbalized understanding   Anesthesia review: no  Patient denies shortness of breath, fever, cough and chest pain at PAT appointment   All instructions explained to the patient, with a verbal understanding of the material. Patient agrees to go over the instructions while at home for a better understanding. Patient also instructed to self quarantine after being tested for COVID-19. The opportunity to ask questions was provided.

## 2021-08-01 ENCOUNTER — Ambulatory Visit: Payer: 59 | Admitting: Surgery

## 2021-08-02 ENCOUNTER — Encounter: Payer: Self-pay | Admitting: Surgery

## 2021-08-02 ENCOUNTER — Other Ambulatory Visit: Payer: Self-pay

## 2021-08-02 ENCOUNTER — Ambulatory Visit (INDEPENDENT_AMBULATORY_CARE_PROVIDER_SITE_OTHER): Payer: 59 | Admitting: Surgery

## 2021-08-02 VITALS — BP 135/75 | HR 63

## 2021-08-02 DIAGNOSIS — M5412 Radiculopathy, cervical region: Secondary | ICD-10-CM

## 2021-08-02 NOTE — Progress Notes (Signed)
65 year old black male with history of C4-C7 HNP/stenosis comes in for preop evaluation.  States that neck pain and bilateral upper extremity radiculopathy unchanged from previous visit and he is wanting to proceed with C4-C7 ACDF is scheduled.  Today history and physical performed.  Review of systems positive for GERD and constipation.  Patient admits to history of marijuana use.  Surgical procedure discussed in detail.  Understands the risk of pseudoarthrosis with history of smoking.  He voiced understanding.  All questions answered.

## 2021-08-03 ENCOUNTER — Other Ambulatory Visit (HOSPITAL_COMMUNITY)
Admission: RE | Admit: 2021-08-03 | Discharge: 2021-08-03 | Disposition: A | Discharge status: Home / Self Care | Payer: 59 | Source: Ambulatory Visit | Attending: Orthopaedic Surgery | Admitting: Orthopaedic Surgery

## 2021-08-03 DIAGNOSIS — Z20822 Contact with and (suspected) exposure to covid-19: Secondary | ICD-10-CM | POA: Insufficient documentation

## 2021-08-03 DIAGNOSIS — Z01818 Encounter for other preprocedural examination: Secondary | ICD-10-CM

## 2021-08-03 DIAGNOSIS — Z01812 Encounter for preprocedural laboratory examination: Secondary | ICD-10-CM | POA: Diagnosis present

## 2021-08-03 LAB — SARS CORONAVIRUS 2 (TAT 6-24 HRS): SARS Coronavirus 2: NEGATIVE

## 2021-08-06 ENCOUNTER — Ambulatory Visit (HOSPITAL_COMMUNITY): Payer: 59 | Admitting: Anesthesiology

## 2021-08-06 ENCOUNTER — Other Ambulatory Visit: Payer: Self-pay

## 2021-08-06 ENCOUNTER — Observation Stay (HOSPITAL_COMMUNITY)
Admission: RE | Admit: 2021-08-06 | Discharge: 2021-08-07 | Disposition: A | Discharge status: Home / Self Care | Payer: 59 | Source: Ambulatory Visit | Attending: Orthopaedic Surgery | Admitting: Orthopaedic Surgery

## 2021-08-06 ENCOUNTER — Ambulatory Visit (HOSPITAL_COMMUNITY): Payer: 59

## 2021-08-06 ENCOUNTER — Encounter (HOSPITAL_COMMUNITY)
Admission: RE | Disposition: A | Discharge status: Home / Self Care | Payer: Self-pay | Source: Ambulatory Visit | Attending: Orthopaedic Surgery

## 2021-08-06 ENCOUNTER — Encounter (HOSPITAL_COMMUNITY): Payer: Self-pay | Admitting: Orthopaedic Surgery

## 2021-08-06 DIAGNOSIS — M4802 Spinal stenosis, cervical region: Secondary | ICD-10-CM | POA: Diagnosis not present

## 2021-08-06 DIAGNOSIS — M4722 Other spondylosis with radiculopathy, cervical region: Secondary | ICD-10-CM | POA: Diagnosis not present

## 2021-08-06 DIAGNOSIS — Z419 Encounter for procedure for purposes other than remedying health state, unspecified: Secondary | ICD-10-CM

## 2021-08-06 HISTORY — PX: ANTERIOR CERVICAL DECOMP/DISCECTOMY FUSION: SHX1161

## 2021-08-06 SURGERY — ANTERIOR CERVICAL DECOMPRESSION/DISCECTOMY FUSION 3 LEVELS
Anesthesia: General | Site: Neck

## 2021-08-06 MED ORDER — DOCUSATE SODIUM 100 MG PO CAPS
100.0000 mg | ORAL_CAPSULE | Freq: Two times a day (BID) | ORAL | Status: DC
  Administered 2021-08-06: 100 mg via ORAL
  Filled 2021-08-06: qty 1

## 2021-08-06 MED ORDER — LACTATED RINGERS IV SOLN: INTRAVENOUS | Status: DC | PRN

## 2021-08-06 MED ORDER — BUPIVACAINE-EPINEPHRINE 0.5% -1:200000 IJ SOLN
INTRAMUSCULAR | Status: DC | PRN
  Administered 2021-08-06: 6 mL

## 2021-08-06 MED ORDER — BUPIVACAINE-EPINEPHRINE 0.5% -1:200000 IJ SOLN
INTRAMUSCULAR | Status: AC
  Filled 2021-08-06: qty 1

## 2021-08-06 MED ORDER — MIDAZOLAM HCL 2 MG/2ML IJ SOLN
INTRAMUSCULAR | Status: AC
  Filled 2021-08-06: qty 2

## 2021-08-06 MED ORDER — ROCURONIUM BROMIDE 10 MG/ML (PF) SYRINGE
PREFILLED_SYRINGE | INTRAVENOUS | Status: DC | PRN
  Administered 2021-08-06: 10 mg via INTRAVENOUS
  Administered 2021-08-06: 60 mg via INTRAVENOUS
  Administered 2021-08-06: 20 mg via INTRAVENOUS

## 2021-08-06 MED ORDER — FENTANYL CITRATE (PF) 100 MCG/2ML IJ SOLN
INTRAMUSCULAR | Status: AC
  Administered 2021-08-06: 25 ug via INTRAVENOUS
  Filled 2021-08-06: qty 2

## 2021-08-06 MED ORDER — FENTANYL CITRATE (PF) 250 MCG/5ML IJ SOLN
INTRAMUSCULAR | Status: DC | PRN
  Administered 2021-08-06: 150 ug via INTRAVENOUS
  Administered 2021-08-06: 50 ug via INTRAVENOUS

## 2021-08-06 MED ORDER — LORATADINE 10 MG PO TABS: 10.0000 mg | ORAL_TABLET | Freq: Every day | ORAL | Status: DC

## 2021-08-06 MED ORDER — CHLORHEXIDINE GLUCONATE 0.12 % MT SOLN
15.0000 mL | Freq: Once | OROMUCOSAL | Status: AC
  Administered 2021-08-06: 15 mL via OROMUCOSAL
  Filled 2021-08-06: qty 15

## 2021-08-06 MED ORDER — METHOCARBAMOL 1000 MG/10ML IJ SOLN
500.0000 mg | Freq: Four times a day (QID) | INTRAVENOUS | Status: DC | PRN
  Filled 2021-08-06: qty 5

## 2021-08-06 MED ORDER — FENTANYL CITRATE (PF) 250 MCG/5ML IJ SOLN
INTRAMUSCULAR | Status: AC
  Filled 2021-08-06: qty 5

## 2021-08-06 MED ORDER — HYDROMORPHONE HCL 1 MG/ML IJ SOLN
0.5000 mg | INTRAMUSCULAR | Status: DC | PRN
  Administered 2021-08-06: 0.5 mg via INTRAVENOUS
  Filled 2021-08-06: qty 0.5

## 2021-08-06 MED ORDER — SODIUM CHLORIDE 0.9 % IV SOLN: 250.0000 mL | INTRAVENOUS | Status: DC

## 2021-08-06 MED ORDER — LIDOCAINE 2% (20 MG/ML) 5 ML SYRINGE
INTRAMUSCULAR | Status: DC | PRN
  Administered 2021-08-06: 60 mg via INTRAVENOUS

## 2021-08-06 MED ORDER — SODIUM CHLORIDE 0.9% FLUSH: 3.0000 mL | INTRAVENOUS | Status: DC | PRN

## 2021-08-06 MED ORDER — MENTHOL 3 MG MT LOZG: 1.0000 | LOZENGE | OROMUCOSAL | Status: DC | PRN

## 2021-08-06 MED ORDER — PROMETHAZINE HCL 25 MG/ML IJ SOLN
6.2500 mg | INTRAMUSCULAR | Status: DC | PRN
  Administered 2021-08-06: 12.5 mg via INTRAVENOUS

## 2021-08-06 MED ORDER — PHENOL 1.4 % MT LIQD: 1.0000 | OROMUCOSAL | Status: DC | PRN

## 2021-08-06 MED ORDER — ACETAMINOPHEN 10 MG/ML IV SOLN: 1000.0000 mg | Freq: Once | INTRAVENOUS | Status: DC | PRN

## 2021-08-06 MED ORDER — DEXAMETHASONE SODIUM PHOSPHATE 10 MG/ML IJ SOLN
INTRAMUSCULAR | Status: DC | PRN
  Administered 2021-08-06: 10 mg via INTRAVENOUS

## 2021-08-06 MED ORDER — SODIUM CHLORIDE 0.9% FLUSH: 3.0000 mL | Freq: Two times a day (BID) | INTRAVENOUS | Status: DC

## 2021-08-06 MED ORDER — DEXMEDETOMIDINE (PRECEDEX) IN NS 20 MCG/5ML (4 MCG/ML) IV SYRINGE
PREFILLED_SYRINGE | INTRAVENOUS | Status: AC
  Filled 2021-08-06: qty 10

## 2021-08-06 MED ORDER — ONDANSETRON HCL 4 MG/2ML IJ SOLN
INTRAMUSCULAR | Status: AC
  Filled 2021-08-06: qty 2

## 2021-08-06 MED ORDER — LACTATED RINGERS IV SOLN: INTRAVENOUS | Status: DC

## 2021-08-06 MED ORDER — PHENYLEPHRINE 40 MCG/ML (10ML) SYRINGE FOR IV PUSH (FOR BLOOD PRESSURE SUPPORT)
PREFILLED_SYRINGE | INTRAVENOUS | Status: DC | PRN
  Administered 2021-08-06: 80 ug via INTRAVENOUS

## 2021-08-06 MED ORDER — OXYCODONE HCL 5 MG PO TABS
5.0000 mg | ORAL_TABLET | ORAL | Status: DC | PRN
  Administered 2021-08-06 – 2021-08-07 (×2): 10 mg via ORAL
  Filled 2021-08-06 (×2): qty 2

## 2021-08-06 MED ORDER — POLYETHYLENE GLYCOL 3350 17 G PO PACK
17.0000 g | PACK | Freq: Every day | ORAL | Status: DC
  Filled 2021-08-06: qty 1

## 2021-08-06 MED ORDER — OXYCODONE-ACETAMINOPHEN 5-325 MG PO TABS: 1.0000 | ORAL_TABLET | ORAL | 0 refills | Status: DC | PRN

## 2021-08-06 MED ORDER — AMISULPRIDE (ANTIEMETIC) 5 MG/2ML IV SOLN: 10.0000 mg | Freq: Once | INTRAVENOUS | Status: DC | PRN

## 2021-08-06 MED ORDER — VANCOMYCIN HCL IN DEXTROSE 1-5 GM/200ML-% IV SOLN
1000.0000 mg | INTRAVENOUS | Status: AC
  Administered 2021-08-06: 1000 mg via INTRAVENOUS
  Filled 2021-08-06: qty 200

## 2021-08-06 MED ORDER — ONDANSETRON HCL 4 MG/2ML IJ SOLN: 4.0000 mg | Freq: Four times a day (QID) | INTRAMUSCULAR | Status: DC | PRN

## 2021-08-06 MED ORDER — ONDANSETRON HCL 4 MG/2ML IJ SOLN
INTRAMUSCULAR | Status: DC | PRN
  Administered 2021-08-06: 4 mg via INTRAVENOUS

## 2021-08-06 MED ORDER — DEXAMETHASONE SODIUM PHOSPHATE 10 MG/ML IJ SOLN
INTRAMUSCULAR | Status: AC
  Filled 2021-08-06: qty 1

## 2021-08-06 MED ORDER — MIDAZOLAM HCL 5 MG/5ML IJ SOLN
INTRAMUSCULAR | Status: DC | PRN
  Administered 2021-08-06: 2 mg via INTRAVENOUS

## 2021-08-06 MED ORDER — AMLODIPINE BESYLATE 5 MG PO TABS: 10.0000 mg | ORAL_TABLET | Freq: Every day | ORAL | Status: DC

## 2021-08-06 MED ORDER — 0.9 % SODIUM CHLORIDE (POUR BTL) OPTIME
TOPICAL | Status: DC | PRN
  Administered 2021-08-06: 1000 mL

## 2021-08-06 MED ORDER — EPHEDRINE SULFATE-NACL 50-0.9 MG/10ML-% IV SOSY
PREFILLED_SYRINGE | INTRAVENOUS | Status: DC | PRN
  Administered 2021-08-06: 5 mg via INTRAVENOUS
  Administered 2021-08-06: 10 mg via INTRAVENOUS

## 2021-08-06 MED ORDER — METHOCARBAMOL 500 MG PO TABS: 500.0000 mg | ORAL_TABLET | Freq: Four times a day (QID) | ORAL | 0 refills | Status: DC | PRN

## 2021-08-06 MED ORDER — DEXMEDETOMIDINE (PRECEDEX) IN NS 20 MCG/5ML (4 MCG/ML) IV SYRINGE
PREFILLED_SYRINGE | INTRAVENOUS | Status: DC | PRN
  Administered 2021-08-06: 8 ug via INTRAVENOUS
  Administered 2021-08-06: 20 ug via INTRAVENOUS

## 2021-08-06 MED ORDER — SODIUM CHLORIDE 0.9 % IV SOLN: INTRAVENOUS | Status: DC

## 2021-08-06 MED ORDER — PROMETHAZINE HCL 25 MG/ML IJ SOLN
INTRAMUSCULAR | Status: AC
  Filled 2021-08-06: qty 1

## 2021-08-06 MED ORDER — ONDANSETRON HCL 4 MG PO TABS: 4.0000 mg | ORAL_TABLET | Freq: Four times a day (QID) | ORAL | Status: DC | PRN

## 2021-08-06 MED ORDER — FENTANYL CITRATE (PF) 100 MCG/2ML IJ SOLN: 25.0000 ug | INTRAMUSCULAR | Status: DC | PRN

## 2021-08-06 MED ORDER — METHOCARBAMOL 500 MG PO TABS
500.0000 mg | ORAL_TABLET | Freq: Four times a day (QID) | ORAL | Status: DC | PRN
  Administered 2021-08-06: 500 mg via ORAL
  Filled 2021-08-06: qty 1

## 2021-08-06 MED ORDER — PROPOFOL 10 MG/ML IV BOLUS
INTRAVENOUS | Status: AC
  Filled 2021-08-06: qty 20

## 2021-08-06 MED ORDER — ROCURONIUM BROMIDE 10 MG/ML (PF) SYRINGE
PREFILLED_SYRINGE | INTRAVENOUS | Status: AC
  Filled 2021-08-06: qty 10

## 2021-08-06 MED ORDER — EPINEPHRINE 0.3 MG/0.3ML IJ SOAJ: 0.3000 mg | INTRAMUSCULAR | Status: DC | PRN

## 2021-08-06 MED ORDER — KETOROLAC TROMETHAMINE 30 MG/ML IJ SOLN: 30.0000 mg | Freq: Once | INTRAMUSCULAR | Status: DC

## 2021-08-06 MED ORDER — ATORVASTATIN CALCIUM 10 MG PO TABS: 20.0000 mg | ORAL_TABLET | Freq: Every day | ORAL | Status: DC

## 2021-08-06 MED ORDER — PANTOPRAZOLE SODIUM 40 MG PO TBEC: 40.0000 mg | DELAYED_RELEASE_TABLET | Freq: Every day | ORAL | Status: DC

## 2021-08-06 MED ORDER — HYDROXYZINE HCL 25 MG PO TABS: 25.0000 mg | ORAL_TABLET | Freq: Three times a day (TID) | ORAL | Status: DC | PRN

## 2021-08-06 MED ORDER — PROPOFOL 10 MG/ML IV BOLUS
INTRAVENOUS | Status: DC | PRN
  Administered 2021-08-06: 200 mg via INTRAVENOUS

## 2021-08-06 MED ORDER — LIDOCAINE 2% (20 MG/ML) 5 ML SYRINGE
INTRAMUSCULAR | Status: AC
  Filled 2021-08-06: qty 5

## 2021-08-06 MED ORDER — ORAL CARE MOUTH RINSE: 15.0000 mL | Freq: Once | OROMUCOSAL | Status: AC

## 2021-08-06 MED ORDER — ACETAMINOPHEN 10 MG/ML IV SOLN
INTRAVENOUS | Status: AC
  Administered 2021-08-06: 1000 mg via INTRAVENOUS
  Filled 2021-08-06: qty 100

## 2021-08-06 SURGICAL SUPPLY — 58 items
AGENT HMST KT MTR STRL THRMB (HEMOSTASIS)
APL SKNCLS STERI-STRIP NONHPOA (GAUZE/BANDAGES/DRESSINGS) ×1
BAG COUNTER SPONGE SURGICOUNT (BAG) ×3 IMPLANT
BAG SPNG CNTER NS LX DISP (BAG) ×1
BENZOIN TINCTURE PRP APPL 2/3 (GAUZE/BANDAGES/DRESSINGS) ×3 IMPLANT
BIT DRILL SM SPINE QC 14 (BIT) ×1 IMPLANT
BONE CC-ACS 11X14X6 6D (Bone Implant) ×2 IMPLANT
BONE CC-ACS 11X14X7 6D (Bone Implant) ×4 IMPLANT
BUR ROUND FLUTED 4 SOFT TCH (BURR) ×3 IMPLANT
CHIPS BONE CANC-ACS11X14X6 6D (Bone Implant) IMPLANT
CHIPS BONE CANC-ACS11X14X7 6D (Bone Implant) IMPLANT
CLSR STERI-STRIP ANTIMIC 1/2X4 (GAUZE/BANDAGES/DRESSINGS) ×1 IMPLANT
COLLAR CERV LO CONTOUR FIRM DE (SOFTGOODS) ×3 IMPLANT
CORD BIPOLAR FORCEPS 12FT (ELECTRODE) IMPLANT
COVER SURGICAL LIGHT HANDLE (MISCELLANEOUS) ×3 IMPLANT
DRAPE C-ARM 42X72 X-RAY (DRAPES) ×3 IMPLANT
DRAPE HALF SHEET 40X57 (DRAPES) ×3 IMPLANT
DRAPE MICROSCOPE LEICA (MISCELLANEOUS) ×3 IMPLANT
DURAPREP 6ML APPLICATOR 50/CS (WOUND CARE) ×3 IMPLANT
ELECT COATED BLADE 2.86 ST (ELECTRODE) ×3 IMPLANT
ELECT REM PT RETURN 9FT ADLT (ELECTROSURGICAL) ×2
ELECTRODE REM PT RTRN 9FT ADLT (ELECTROSURGICAL) ×2 IMPLANT
EVACUATOR 1/8 PVC DRAIN (DRAIN) ×3 IMPLANT
GAUZE SPONGE 4X4 12PLY STRL (GAUZE/BANDAGES/DRESSINGS) ×3 IMPLANT
GAUZE XEROFORM 1X8 LF (GAUZE/BANDAGES/DRESSINGS) ×3 IMPLANT
GLOVE SRG 8 PF TXTR STRL LF DI (GLOVE) ×4 IMPLANT
GLOVE SURG ORTHO LTX SZ7.5 (GLOVE) ×6 IMPLANT
GLOVE SURG UNDER POLY LF SZ8 (GLOVE) ×4
GOWN STRL REUS W/ TWL LRG LVL3 (GOWN DISPOSABLE) ×2 IMPLANT
GOWN STRL REUS W/ TWL XL LVL3 (GOWN DISPOSABLE) ×2 IMPLANT
GOWN STRL REUS W/TWL 2XL LVL3 (GOWN DISPOSABLE) ×3 IMPLANT
GOWN STRL REUS W/TWL LRG LVL3 (GOWN DISPOSABLE) ×2
GOWN STRL REUS W/TWL XL LVL3 (GOWN DISPOSABLE) ×2
HALTER HD/CHIN CERV TRACTION D (MISCELLANEOUS) ×3 IMPLANT
HEMOSTAT SURGICEL 2X14 (HEMOSTASIS) IMPLANT
KIT BASIN OR (CUSTOM PROCEDURE TRAY) ×3 IMPLANT
KIT TURNOVER KIT B (KITS) ×3 IMPLANT
MANIFOLD NEPTUNE II (INSTRUMENTS) ×3 IMPLANT
NDL 25GX 5/8IN NON SAFETY (NEEDLE) ×2 IMPLANT
NEEDLE 25GX 5/8IN NON SAFETY (NEEDLE) ×2 IMPLANT
NS IRRIG 1000ML POUR BTL (IV SOLUTION) ×3 IMPLANT
PACK ORTHO CERVICAL (CUSTOM PROCEDURE TRAY) ×3 IMPLANT
PAD ARMBOARD 7.5X6 YLW CONV (MISCELLANEOUS) ×6 IMPLANT
PATTIES SURGICAL .5 X.5 (GAUZE/BANDAGES/DRESSINGS) ×1 IMPLANT
PIN TEMP FIXATION KIRSCHNER (EXFIX) ×1 IMPLANT
PLATE ANT CERV XTEND 3 LV 48 (Plate) ×1 IMPLANT
POSITIONER HEAD DONUT 9IN (MISCELLANEOUS) ×3 IMPLANT
SCREW XTD VAR 4.2 SELF TAP (Screw) ×8 IMPLANT
STRIP CLOSURE SKIN 1/2X4 (GAUZE/BANDAGES/DRESSINGS) ×3 IMPLANT
SURGIFLO W/THROMBIN 8M KIT (HEMOSTASIS) IMPLANT
SUT BONE WAX W31G (SUTURE) ×3 IMPLANT
SUT VIC AB 3-0 X1 27 (SUTURE) ×3 IMPLANT
SUT VICRYL 4-0 PS2 18IN ABS (SUTURE) ×6 IMPLANT
SYR 30ML SLIP (SYRINGE) ×3 IMPLANT
SYR BULB EAR ULCER 3OZ GRN STR (SYRINGE) ×3 IMPLANT
TOWEL GREEN STERILE (TOWEL DISPOSABLE) ×3 IMPLANT
TOWEL GREEN STERILE FF (TOWEL DISPOSABLE) ×3 IMPLANT
WATER STERILE IRR 1000ML POUR (IV SOLUTION) ×3 IMPLANT

## 2021-08-06 NOTE — Interval H&P Note (Signed)
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  The various methods of treatment have been discussed with the patient and family. After consideration of risks, benefits and other options for treatment, the patient has consented to  Procedure(s): ANTERIOR CERVICAL DISCECTOMY FUSION C4-5, C5-6, C6-7, ALLOGRAFT, PLATE (N/A) as a surgical intervention.  The patient's history has been reviewed, patient examined, no change in status, stable for surgery.  I have reviewed the patient's chart and labs.  Questions were answered to the patient's satisfaction.     [name]

## 2021-08-06 NOTE — H&P (Signed)
David Graves is an 65 y.o. male.   Chief Complaint: neck pain and UE radiculopathy  HPI: 65 year old black male with history of C4-C7 HNP/stenosis comes in for preop evaluation.  States that neck pain and bilateral upper extremity radiculopathy unchanged from previous visit and he is wanting to proceed with C4-C7 ACDF is scheduled.  Today history and physical performed.  Review of systems positive for GERD and constipation.  Patient admits to history of marijuana use.    Past Surgical History:  Procedure Laterality Date   COLONOSCOPY  10/20/2017   RADIOLOGY WITH ANESTHESIA N/A 10/17/2020   Procedure: MRI WITH ANESTHESIA  LUMBAR WITHOUT CONTRAST AND CERVICAL WITHOUT CONTRAST;  Surgeon: Radiologist, Medication, MD;  Location: [facility] OR;  Service: Radiology;  Laterality: N/A;   UPPER GI ENDOSCOPY     VASECTOMY     Per patient    Family History  Problem Relation Age of Onset   Healthy Son    Healthy Son    Lupus Niece    Social History:  reports that he quit smoking about 9 years ago. His smoking use included cigarettes. He has never used smokeless tobacco. He reports that he does not currently use alcohol. He reports current drug use. Frequency: 4.00 times per week. Drug: Marijuana.  Allergies:  Allergies  Allergen Reactions   Bee Venom Swelling   Cinnamon Other (See Comments)    wheezing   Codeine Other (See Comments)    GI ISSUES   Colchicine Diarrhea   Hydrocodone-Acetaminophen Other (See Comments)    SWEATING   Shellfish Allergy Swelling    (TONGUE)   Sulfa Antibiotics Nausea And Vomiting   Penicillins Rash    No medications prior to admission.    No results found for this or any previous visit (from the past 48 hour(s)). No results found.  Review of Systems  Constitutional:  Positive for activity change.  HENT: Negative.    Respiratory: Negative.    Cardiovascular: Negative.   Genitourinary: Negative.   Musculoskeletal:  Positive for neck pain and neck stiffness.   Neurological:  Positive for numbness.  Psychiatric/Behavioral: Negative.     There were no vitals taken for this visit. Physical Exam HENT:     Head: Normocephalic and atraumatic.     Nose: Nose normal.  Eyes:     Extraocular Movements: Extraocular movements intact.  Cardiovascular:     Rate and Rhythm: Regular rhythm.     Heart sounds: Normal heart sounds.  Pulmonary:     Effort: Pulmonary effort is normal. No respiratory distress.     Breath sounds: Normal breath sounds.  Abdominal:     General: Bowel sounds are normal. There is no distension.  Musculoskeletal:        General: Tenderness present.  Neurological:     Mental Status: He is alert and oriented to person, place, and time.  Psychiatric:        Mood and Affect: Mood normal.     Assessment/Plan C4-C7 HNP/stenosis  Will procedure with C4-C7 ACDF as scheduled.  Surgical procedure discussed in detail.  Understands the risk of pseudoarthrosis with history of smoking.  He voiced understanding.  All questions answered  Zonia Kief, PA-C 08/06/2021, 7:07 AM

## 2021-08-06 NOTE — Anesthesia Procedure Notes (Signed)
Procedure Name: Intubation Date/Time: 08/06/2021 2:40 PM Performed by: Lovie Chol, CRNA Pre-anesthesia Checklist: Patient identified, Emergency Drugs available, Suction available and Patient being monitored Patient Re-evaluated:Patient Re-evaluated prior to induction Oxygen Delivery Method: Circle System Utilized Preoxygenation: Pre-oxygenation with 100% oxygen Induction Type: IV induction Ventilation: Mask ventilation without difficulty Laryngoscope Size: Glidescope and 4 Grade View: Grade I Tube type: Oral Tube size: 7.5 mm Number of attempts: 1 Airway Equipment and Method: Stylet and Oral airway Placement Confirmation: ETT inserted through vocal cords under direct vision, positive ETCO2 and breath sounds checked- equal and bilateral Secured at: 22 cm Tube secured with: Tape Dental Injury: Teeth and Oropharynx as per pre-operative assessment

## 2021-08-06 NOTE — Discharge Instructions (Addendum)
You have less neck swelling and discomfort if you sleep in a recliner type position for a while.  Once swelling goes down he can resume sleeping in bed.  Collar stays on at all times.  He may switch to the shower collar wrapped with cellophane or Saran wrap around it when you shower.  After you dry off remove the shower collar and reapply the dry collar without moving your neck.  No shaving of your neck.  Collar needs to be on securely to help prevent moving your neck so the bone graft can heal.  Pain medication was sent to your pharmacy.  See Dr. Ophelia Charter in 1 week.

## 2021-08-06 NOTE — Anesthesia Preprocedure Evaluation (Addendum)
Anesthesia Evaluation  Patient identified by MRN, date of birth, ID band Patient awake    Reviewed: Allergy & Precautions, NPO status , Patient's Chart, lab work & pertinent test results  Airway Mallampati: II  TM Distance: >3 FB Neck ROM: Full    Dental no notable dental hx. (+) Dental Advisory Given, Poor Dentition   Pulmonary asthma , former smoker,    Pulmonary exam normal breath sounds clear to auscultation       Cardiovascular hypertension, Pt. 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, C5-6, C6-7  Reproductive/Obstetrics                           Anesthesia Physical Anesthesia Plan  ASA: 2  Anesthesia Plan: General   Post-op Pain Management:    Induction: Intravenous  PONV Risk Score and Plan: 2 and Ondansetron, Dexamethasone, Midazolam and Treatment may vary due to age or medical condition  Airway Management Planned: Oral ETT and Video Laryngoscope Planned  Additional Equipment:   Intra-op Plan:   Post-operative Plan: Extubation in OR  Informed Consent: I have reviewed the patients History and Physical, chart, labs and discussed the procedure including the risks, benefits and alternatives for the proposed anesthesia with the patient or authorized representative who has indicated his/her understanding and acceptance.     Dental advisory given  Plan Discussed with: CRNA  Anesthesia Plan Comments:        Anesthesia Quick Evaluation

## 2021-08-06 NOTE — Op Note (Signed)
Preop diagnosis: Cervical spondylosis, foraminal stenosis C4-5, C5-6, C6-7.  Postop diagnosis: Same  Procedure: C4-5, C5-6, C6-7 anterior cervical discectomy and fusion, allograft and plate.  (3 level fusion)  Surgeon: Ophelia Charter MD  Assistant: Zonia Kief, PA-C medically necessary and present for exposure decompression and graft placement, plate placement.  Second assist April Fulp, RNFA.  EBL: Minimal  Drains: 1 Hemovac neck  Foley catheter placed after induction of anesthesia.  Implants:Implants  BONE CC-ACS [id number] - [id number]  Inventory Item: BONE CC-ACS 11X14X7 6D Serial no.: [id number] Model/Cat no.: [id number]  Implant name: BONE CC-ACS [id number] 6D - [id number] Laterality: N/A Area: Spine Cervical  Manufacturer: Tobie Poet FNDN Date of Manufacture:    Action: Implanted Number Used: 1   Device Identifier:  Device Identifier Type:     BONE CC-ACS [id number] 6D - [id number]  Inventory Item: BONE CC-ACS 11X14X6 6D Serial no.: [id number] Model/Cat no.: [id number]  Implant name: BONE CC-ACS 11X14X6 6D - [id number] Laterality: N/A Area: Spine Cervical  Manufacturer: Tobie Poet FNDN Date of Manufacture:    Action: Implanted Number Used: 1   Device Identifier:  Device Identifier Type:     BONE CC-ACS [id number] 6D - [id number]  Inventory Item: BONE CC-ACS 11X14X6 6D Serial no.: [id number] Model/Cat no.: [id number]  Implant name: BONE CC-ACS 11X14X6 6D - [id number] Laterality: N/A Area: Spine Cervical  Manufacturer: Tobie Poet FNDN Date of Manufacture:    Action: Implanted Number Used: 1   Device Identifier:  Device Identifier Type:     PLATE ANT CERV XTEND 3 LV 48 - [id number]  Inventory Item: PLATE ANT CERV XTEND 3 LV 48 Serial no.:  Model/Cat no.: [id number]  Implant name: PLATE ANT CERV XTEND 3 LV 48 - [id number] Laterality: N/A Area: Spine Cervical  Manufacturer: [employer] Date of Manufacture:     Action: Implanted Number Used: 1   Device Identifier:  Device Identifier Type:     SCREW XTD VAR 4.2 SELF TAP - [id number]  Inventory Item: SCREW XTD VAR 4.2 SELF TAP Serial no.:  Model/Cat no.: [id number]  Implant name: SCREW XTD VAR 4.2 SELF TAP - [id number] Laterality: N/A Area: Spine Cervical  Manufacturer: [employer] Date of Manufacture:    Action: Implanted Number Used: 1   Device Identifier:  Device Identifier Type:    Globus plate and screw system.  6 mm graft times two 7 mm x 1.  Procedure: After induction of general anesthesia orotracheal intubation Foley catheter placement wrist restraints second IV was started due to the initial IV running poorly.  Arms were tucked at side of the elbow pads at all  Was applied without weight.  Neck was prepped with ChloraPrep due to the patient's allergy.  Usual area squared with towel sterile skin marker clear Steri-Drape sterile Mayo stand the head and thyroid sheets and drapes timeout procedure was completed.  Incision was made over the C5-6 level based on palpable landmarks extended from midline to the left slightly longer since it was a 3 level exposure.  Dissection underneath the omohyoid onto the C5/6 level with prominent spur and short 25 needle was placed confirmed with sterilely draped C arm.  Disc was immediately marked taking big chunks of the disc out with pituitary and scalpel and spurs removed anteriorly.  Soft and Claritin were placed after exposure level above and below and first level was being see 5-6 level.  We progressed back to posterior longitudinal ligament.  Bone was extremely hard and bur was used as as well as 1  to 2 mm Kerrisons and the Cloward curettes and also the [employer] curettes.  Bone is completely removed foramen was enlarged on each side uncovertebral joints were stripped with a Cloward curettes and dura was decompressed.  7 mm graft gave nice tight fit 6 was little bit loose and graft was marked anteriorly countersunk few  millimeters anterior spurs were removed and then identical procedure was repeated at the C4-5 level.  At this level there was less severe foraminal tightness and was removed back in the posterior longitudinal ligament was not completely taken down.  6 graft gave tight fit and restored disc base height.  Anterior spurs removed with a bur and not looking appeared that the graft at C5-6 could be countersunk a little bit additionally and a little bit more spurs removed so the plate would sit flat.  With that ultra traction pulled by the CRNA C5-6 graft was countersunk another 2 mm spurs removed anteriorly off of the inferior aspect of C4 and then removed the self-retaining plate retractors down to C6.  Decompression was performed and a 6 graft was placed at this level.  A 3 level 48 mm plate was selected adjusted checked under C arm held with the spike fixed with a screw and adjusted to try to make it as straight as possible on AP x-ray and fit in the area where the spurs have been removed.  Screw holes were filled and bone was extremely hard difficult to advance the screws and couple times we had to drill it a second time in order to get screw did advance all the way down and sit flat with the plate.  Final spot pictures were taken tiny screwdriver was used to lock all 8 screws in Hemovacs placed in and out technique operative field was dry copious irrigation.  Epidural space was dry at all 3 levels at time of closure.  Platysma closed with 3-0 Vicryl 4-0 Vicryl subcuticular closure tincture benzoin Steri-Strips 4 x 4 tape and soft cervical collar.  Patient transferred recovery room.

## 2021-08-06 NOTE — Transfer of Care (Signed)
Immediate Anesthesia Transfer of Care Note  Patient: David Graves  Procedure(s) Performed: ANTERIOR CERVICAL DISCECTOMY FUSION C4-5, C5-6, C6-7, ALLOGRAFT, PLATE (Neck)  Patient Location: PACU  Anesthesia Type:General  Level of Consciousness: drowsy, patient cooperative and responds to stimulation  Airway & Oxygen Therapy: Patient Spontanous Breathing  Post-op Assessment: Report given to RN and Post -op Vital signs reviewed and stable  Post vital signs: Reviewed and stable  Last Vitals:  Vitals Value Taken Time  BP 162/82 08/06/21 1739  Temp    Pulse 65 08/06/21 1742  Resp 18 08/06/21 1742  SpO2 97 % 08/06/21 1742  Vitals shown include unvalidated device data.  Last Pain:  Vitals:   08/06/21 1057  TempSrc:   PainSc: 0-No pain         Complications: No notable events documented.

## 2021-08-07 DIAGNOSIS — M4802 Spinal stenosis, cervical region: Secondary | ICD-10-CM | POA: Diagnosis not present

## 2021-08-07 NOTE — Progress Notes (Signed)
Patient ID: David Graves, male   DOB: 1956/05/05, 65 y.o.   MRN: [id number]   Subjective: 1 Day Post-Op Procedure(s) (LRB): ANTERIOR CERVICAL DISCECTOMY FUSION C4-5, C5-6, C6-7, ALLOGRAFT, PLATE (N/A) Patient reports pain as mild.    Objective: Vital signs in last 24 hours: Temp:  [97 F (36.1 C)-98.6 F (37 C)] 98.1 F (36.7 C) (12/13 0720) Pulse Rate:  [57-76] 63 (12/13 0720) Resp:  [16-20] 18 (12/13 0720) BP: (142-170)/(70-83) 142/77 (12/13 0720) SpO2:  [96 %-100 %] 100 % (12/13 0720) Weight:  [78.5 kg] 78.5 kg (12/12 1043)  Intake/Output from previous day: 12/12 0701 - 12/13 0700 In: 1800 [P.O.:300; I.V.:1500] Out: 1855 [Urine:1350; Drains:55; Blood:450] Intake/Output this shift: No intake/output data recorded.  No results for input(s): HGB in the last 72 hours. No results for input(s): WBC, RBC, HCT, PLT in the last 72 hours. No results for input(s): NA, K, CL, CO2, BUN, CREATININE, GLUCOSE, CALCIUM in the last 72 hours. No results for input(s): LABPT, INR in the last 72 hours.  Neurologically intact DG Cervical Spine 2 or 3 views  Result Date: 08/06/2021 CLINICAL DATA:  C4-C7 ACDF EXAM: CERVICAL SPINE - 2-3 VIEW COMPARISON:  09/27/2020 FINDINGS: Changes of ACDF C4-C7. Normal alignment. No hardware bony complicating feature. IMPRESSION: C4-C7 ACDF.  No visible complicating feature. Electronically Signed   By: Charlett Nose M.D.   On: 08/06/2021 19:34   DG C-Arm 1-60 Min-No Report  Result Date: 08/06/2021 Fluoroscopy was utilized by the requesting physician.  No radiographic interpretation.   DG C-Arm 1-60 Min-No Report  Result Date: 08/06/2021 Fluoroscopy was utilized by the requesting physician.  No radiographic interpretation.   DG C-Arm 1-60 Min-No Report  Result Date: 08/06/2021 Fluoroscopy was utilized by the requesting physician.  No radiographic interpretation.    Assessment/Plan: 1 Day Post-Op Procedure(s) (LRB): ANTERIOR CERVICAL DISCECTOMY FUSION  C4-5, C5-6, C6-7, ALLOGRAFT, PLATE (N/A) Up with therapy, discharge home this AM  [name] 08/07/2021, 7:46 AM

## 2021-08-07 NOTE — Anesthesia Postprocedure Evaluation (Signed)
Anesthesia Post Note  Patient: David Graves  Procedure(s) Performed: ANTERIOR CERVICAL DISCECTOMY FUSION C4-5, C5-6, C6-7, ALLOGRAFT, PLATE (Neck)     Patient location during evaluation: PACU Anesthesia Type: General Level of consciousness: awake Pain management: pain level controlled Vital Signs Assessment: post-procedure vital signs reviewed and stable Respiratory status: spontaneous breathing, nonlabored ventilation, respiratory function stable and patient connected to nasal cannula oxygen Cardiovascular status: blood pressure returned to baseline and stable Postop Assessment: no apparent nausea or vomiting Anesthetic complications: no   No notable events documented.  Last Vitals:  Vitals:   08/07/21 0339 08/07/21 0720  BP: (!) 155/81 (!) 142/77  Pulse: 71 63  Resp: 18 18  Temp: 37 C 36.7 C  SpO2: 99% 100%    Last Pain:  Vitals:   08/07/21 0845  TempSrc:   PainSc: 3                  David Graves P David Graves

## 2021-08-07 NOTE — Evaluation (Signed)
Occupational Therapy Evaluation Patient Details Name: David Graves MRN: [id number] DOB: 1956/02/22 Today's Date: 08/07/2021   History of Present Illness Pt is a 65 y/o M s/p C4-5, C5-6, C6-7 ACDF. No pertinent PMH   Clinical Impression   Pt is independent at baseline with ADLs and functional mobility. Plans to d/c home with friend for assistance. Pt Independent - supervision with ADLs during session, needing cues for compensatory strategies with bathing, dressing, and standing grooming tasks. Educated pt on cervical precautions, handout provided, pt verbalized/demonstrated understanding, able to adhere to precautions throughout session. Pt limited by decreased ROM and strength at this time, however has no acute OT needs. Recommend d/c home with assistance.      Recommendations for follow up therapy are one component of a multi-disciplinary discharge planning process, led by the attending physician.  Recommendations may be updated based on patient status, additional functional criteria and insurance authorization.   Follow Up Recommendations  No OT follow up    Assistance Recommended at Discharge PRN  Functional Status Assessment  Patient has had a recent decline in their functional status and demonstrates the ability to make significant improvements in function in a reasonable and predictable amount of time.  Equipment Recommendations  None recommended by OT    Recommendations for Other Services PT consult     Precautions / Restrictions Precautions Precautions: Back Precaution Booklet Issued: Yes (comment) Precaution Comments: pt able to verbally state 3/3 cervical precautions Required Braces or Orthoses: Cervical Brace Cervical Brace: Soft collar Restrictions Weight Bearing Restrictions: No      Mobility Bed Mobility Overal bed mobility: Modified Independent             General bed mobility comments: cues for log rolling technique    Transfers Overall transfer  level: Modified independent                 General transfer comment: pt up in room upon arrival      Balance Overall balance assessment: No apparent balance deficits (not formally assessed)                                         ADL either performed or assessed with clinical judgement   ADL Overall ADL's : Needs assistance/impaired Eating/Feeding: Independent;Sitting   Grooming: Independent;Sitting   Upper Body Bathing: Independent;Sitting   Lower Body Bathing: Independent;Sitting/lateral leans;Sit to/from stand   Upper Body Dressing : Supervision/safety;Sitting Upper Body Dressing Details (indicate cue type and reason): donned shirt sitting EOB, required cues for sequencing adhering to precautions Lower Body Dressing: Supervision/safety;Sit to/from stand Lower Body Dressing Details (indicate cue type and reason): Donned pants and socks sitting EOB, required cues for adhering to precautions Toilet Transfer: Modified Independent   Toileting- Clothing Manipulation and Hygiene: Modified independent   Tub/ Shower Transfer: Supervision/safety;Ambulation;Tub transfer Tub/Shower Transfer Details (indicate cue type and reason): simulated lateral stepping into tub         Vision   Vision Assessment?: No apparent visual deficits     Perception     Praxis      Pertinent Vitals/Pain Pain Assessment: No/denies pain     Hand Dominance     Extremity/Trunk Assessment Upper Extremity Assessment Upper Extremity Assessment: Overall WFL for tasks assessed   Lower Extremity Assessment Lower Extremity Assessment: Overall WFL for tasks assessed   Cervical / Trunk Assessment Cervical / Trunk Assessment: Neck Surgery  Communication Communication Communication: No difficulties   Cognition Arousal/Alertness: Awake/alert Behavior During Therapy: Anxious;WFL for tasks assessed/performed Overall Cognitive Status: Within Functional Limits for tasks  assessed                                       General Comments  Reviewed cervical precautions with pt as well as compensatory strategies fro grooming, bathing, and dressing.    Exercises     Shoulder Instructions      Home Living Family/patient expects to be discharged to:: Private residence Living Arrangements: Spouse/significant other Available Help at Discharge: Friend(s) Type of Home: House Home Access: Stairs to enter [employer] of Steps: 1   Home Layout: One level     Bathroom Shower/Tub: Chief Strategy Officer: Standard Bathroom Accessibility: No   Home Equipment: None          Prior Functioning/Environment Prior Level of Function : Independent/Modified Independent                        OT Problem List: Decreased strength;Decreased range of motion;Impaired balance (sitting and/or standing)      OT Treatment/Interventions:      OT Goals(Current goals can be found in the care plan section) Acute Rehab OT Goals Patient Stated Goal: to go home OT Goal Formulation: With patient Time For Goal Achievement: 08/21/21 Potential to Achieve Goals: Good  OT Frequency:     Barriers to D/C:            Co-evaluation              AM-PAC OT "6 Clicks" Daily Activity     Outcome Measure Help from another person eating meals?: None Help from another person taking care of personal grooming?: None Help from another person toileting, which includes using toliet, bedpan, or urinal?: None Help from another person bathing (including washing, rinsing, drying)?: A Little Help from another person to put on and taking off regular upper body clothing?: A Little Help from another person to put on and taking off regular lower body clothing?: A Little 6 Click Score: 21   End of Session Nurse Communication: Mobility status  Activity Tolerance: Patient tolerated treatment well Patient left: in bed;with call bell/phone  within reach  OT Visit Diagnosis: Other abnormalities of gait and mobility (R26.89);Unsteadiness on feet (R26.81);Muscle weakness (generalized) (M62.81)                Time: 8250-5397 OT Time Calculation (min): 15 min Charges:  OT General Charges $OT Visit: 1 Visit OT Evaluation $OT Eval Low Complexity: 1 Low  Alfonzo Beers, OTD, OTR/L Acute Rehab [phone]   Mayer Masker 08/07/2021, 8:07 AM

## 2021-08-07 NOTE — Plan of Care (Signed)
Pt doing well. Pt and wife given D/C instructions with verbal understanding. Rx's were sent to the pharmacy by MD. Pt's incision is clean and dry with no sign of infection. Pt's IV and drain were removed prior to D/C. Pt D/C'd home via wheelchair per MD order. Pt is stable @ D/C and has no other needs at this time. Cloe Sockwell, RN  

## 2021-08-09 ENCOUNTER — Encounter (HOSPITAL_COMMUNITY): Payer: Self-pay | Admitting: Orthopaedic Surgery

## 2021-08-10 ENCOUNTER — Encounter (HOSPITAL_COMMUNITY): Payer: Self-pay | Admitting: Orthopaedic Surgery

## 2021-08-14 ENCOUNTER — Encounter: Payer: Self-pay | Admitting: Orthopaedic Surgery

## 2021-08-14 ENCOUNTER — Ambulatory Visit (INDEPENDENT_AMBULATORY_CARE_PROVIDER_SITE_OTHER): Payer: 59

## 2021-08-14 ENCOUNTER — Ambulatory Visit (INDEPENDENT_AMBULATORY_CARE_PROVIDER_SITE_OTHER): Payer: 59 | Admitting: Orthopaedic Surgery

## 2021-08-14 ENCOUNTER — Other Ambulatory Visit: Payer: Self-pay

## 2021-08-14 VITALS — BP 153/78 | HR 69 | Ht 67.0 in | Wt 173.0 lb

## 2021-08-14 DIAGNOSIS — Z981 Arthrodesis status: Secondary | ICD-10-CM

## 2021-08-14 NOTE — Progress Notes (Signed)
Post-Op Visit Note   Patient: David Graves           Date of Birth: 01-Aug-1956           MRN: [id number] Visit Date: 08/14/2021 PCP: Mila Palmer, MD   Assessment & Plan: Patient returns post three-level cervical fusion incision looks good he has some extra collar coverage continue collar which she will wear all the time.  Recheck 5 weeks for lateral flexion-extension x-rays.  Chief Complaint:  Chief Complaint  Patient presents with   Neck - Routine Post Op    08/06/2021 C4-5, C5-6, C6-7 ACDF   Visit Diagnoses:  1. Status post cervical spinal fusion   2. S/P cervical spinal fusion     Plan: Return in 5 weeks lateral flexion-extension x-ray and AP x-ray on return.  Follow-Up Instructions: No follow-ups on file.   Orders:  Orders Placed This Encounter  Procedures   XR Cervical Spine 2 or 3 views   No orders of the defined types were placed in this encounter.   Imaging: XR Cervical Spine 2 or 3 views  Result Date: 08/14/2021 2 view x-rays demonstrate 3 level cervical fusion C4-C7 with allograft and plate.  Good position and alignment of the screws and graft. Impression: Satisfactory 3 level cervical fusion C4-C7.   PMFS History: Patient Active Problem List   Diagnosis Date Noted   S/P cervical spinal fusion 08/14/2021   Cervical spinal stenosis 08/06/2021   Other spondylosis with radiculopathy, cervical region 07/02/2021   Foraminal stenosis of cervical region 07/02/2021   GERD (gastroesophageal reflux disease) 06/28/2020   Hyperlipidemia 06/28/2020   Hypertension 06/28/2020   Bilateral hand pain 06/28/2020   Rheumatoid factor positive 06/28/2020   Anxiety 12/09/2018   Past Medical History:  Diagnosis Date   Anxiety    Asthma    no inhaler   Atopic dermatitis    Bee sting    BMI 29.0-29.9,adult    Cervical radiculopathy    Chronic insomnia    Complaint of paresthesia    Elevated blood pressure reading    Epididymitis    Family history of prostate  cancer    GERD (gastroesophageal reflux disease)    Gout    Heart murmur    Hypertension    Lateral epicondylitis of right elbow    Pure hypercholesterolemia    Seasonal allergic rhinitis due to pollen     Family History  Problem Relation Age of Onset   Healthy Son    Healthy Son    Lupus Niece     Past Surgical History:  Procedure Laterality Date   ANTERIOR CERVICAL DECOMP/DISCECTOMY FUSION N/A 08/06/2021   Procedure: ANTERIOR CERVICAL DISCECTOMY FUSION C4-5, C5-6, C6-7, ALLOGRAFT, PLATE;  Surgeon: [name], MD;  Location: [facility] OR;  Service: Orthopedics;  Laterality: N/A;   COLONOSCOPY  10/20/2017   RADIOLOGY WITH ANESTHESIA N/A 10/17/2020   Procedure: MRI WITH ANESTHESIA  LUMBAR WITHOUT CONTRAST AND CERVICAL WITHOUT CONTRAST;  Surgeon: Radiologist, Medication, MD;  Location: [facility] OR;  Service: Radiology;  Laterality: N/A;   UPPER GI ENDOSCOPY     VASECTOMY     Per patient   Social History   Occupational History   Not on file  Tobacco Use   Smoking status: Former    Types: Cigarettes    Quit date: 05/16/2012    Years since quitting: 9.2   Smokeless tobacco: Never  Vaping Use   Vaping Use: Never used  Substance and Sexual Activity   Alcohol use:  Not Currently   Drug use: Yes    Frequency: 4.0 times per week    Types: Marijuana    Comment: uses daily   Sexual activity: Yes

## 2021-08-15 ENCOUNTER — Ambulatory Visit: Payer: 59

## 2021-08-15 ENCOUNTER — Telehealth: Payer: Self-pay | Admitting: Orthopaedic Surgery

## 2021-08-15 NOTE — Discharge Summary (Signed)
Patient ID: David Graves MRN: [id number] DOB/AGE: 04-14-1956 65 y.o.  Admit date: 08/06/2021 Discharge date: 08/07/2021  Admission Diagnoses:  Principal Problem:   Cervical spinal stenosis Active Problems:   Other spondylosis with radiculopathy, cervical region   Discharge Diagnoses:  Principal Problem:   Cervical spinal stenosis Active Problems:   Other spondylosis with radiculopathy, cervical region  status post Procedure(s): ANTERIOR CERVICAL DISCECTOMY FUSION C4-5, C5-6, C6-7, ALLOGRAFT, PLATE  Past Medical History:  Diagnosis Date   Anxiety    Asthma    no inhaler   Atopic dermatitis    Bee sting    BMI 29.0-29.9,adult    Cervical radiculopathy    Chronic insomnia    Complaint of paresthesia    Elevated blood pressure reading    Epididymitis    Family history of prostate cancer    GERD (gastroesophageal reflux disease)    Gout    Heart murmur    Hypertension    Lateral epicondylitis of right elbow    Pure hypercholesterolemia    Seasonal allergic rhinitis due to pollen     Surgeries: Procedure(s): ANTERIOR CERVICAL DISCECTOMY FUSION C4-5, C5-6, C6-7, ALLOGRAFT, PLATE on 71/69/6789   Consultants:   Discharged Condition: Improved  Hospital Course: David Graves is an 65 y.o. male who was admitted 08/06/2021 for operative treatment of Cervical spinal stenosis. Patient failed conservative treatments (please see the history and physical for the specifics) and had severe unremitting pain that affects sleep, daily activities and work/hobbies. After pre-op clearance, the patient was taken to the operating room on 08/06/2021 and underwent  Procedure(s): ANTERIOR CERVICAL DISCECTOMY FUSION C4-5, C5-6, C6-7, ALLOGRAFT, PLATE.    Patient was given perioperative antibiotics:  Anti-infectives (From admission, onward)    Start     Dose/Rate Route Frequency Ordered Stop   08/06/21 1100  vancomycin (VANCOCIN) IVPB 1000 mg/200 mL premix        1,000 mg 200 mL/hr  over 60 Minutes Intravenous On call to O.R. 08/06/21 1045 08/06/21 1250        Patient was given sequential compression devices and early ambulation to prevent DVT.   Patient benefited maximally from hospital stay and there were no complications. At the time of discharge, the patient was urinating/moving their bowels without difficulty, tolerating a regular diet, pain is controlled with oral pain medications and they have been cleared by PT/OT.   Recent vital signs: No data found.   Recent laboratory studies: No results for input(s): WBC, HGB, HCT, PLT, NA, K, CL, CO2, BUN, CREATININE, GLUCOSE, INR, CALCIUM in the last 72 hours.  Invalid input(s): PT, 2   Discharge Medications:   Allergies as of 08/07/2021       Reactions   Bee Venom Swelling   Cinnamon Other (See Comments)   wheezing   Codeine Other (See Comments)   GI ISSUES   Colchicine Diarrhea   Hydrocodone-acetaminophen Other (See Comments)   SWEATING   Shellfish Allergy Swelling   (TONGUE)   Sulfa Antibiotics Nausea And Vomiting   Penicillins Rash        Medication List     STOP taking these medications    acetaminophen 500 MG tablet Commonly known as: TYLENOL   aspirin EC 81 MG tablet       TAKE these medications    amLODipine 10 MG tablet Commonly known as: NORVASC Take 10 mg by mouth daily.   atorvastatin 20 MG tablet Commonly known as: LIPITOR Take 20 mg by mouth daily.   cetirizine 10  MG tablet Commonly known as: ZYRTEC Take 10 mg by mouth daily.   EPINEPHrine 0.3 mg/0.3 mL Soaj injection Commonly known as: EPI-PEN Inject 0.3 mg into the muscle as needed for anaphylaxis.   hydrOXYzine 25 MG tablet Commonly known as: ATARAX Take 25 mg by mouth 3 (three) times daily as needed for anxiety.   methocarbamol 500 MG tablet Commonly known as: Robaxin Take 1 tablet (500 mg total) by mouth every 6 (six) hours as needed for muscle spasms.   oxyCODONE-acetaminophen 5-325 MG tablet Commonly  known as: Percocet Take 1 tablet by mouth every 4 (four) hours as needed for severe pain.   pantoprazole 40 MG tablet Commonly known as: PROTONIX Take 40 mg by mouth daily.        Diagnostic Studies: DG Cervical Spine 2 or 3 views  Result Date: 08/06/2021 CLINICAL DATA:  C4-C7 ACDF EXAM: CERVICAL SPINE - 2-3 VIEW COMPARISON:  09/27/2020 FINDINGS: Changes of ACDF C4-C7. Normal alignment. No hardware bony complicating feature. IMPRESSION: C4-C7 ACDF.  No visible complicating feature. Electronically Signed   By: Charlett Nose M.D.   On: 08/06/2021 19:34   DG C-Arm 1-60 Min-No Report  Result Date: 08/06/2021 Fluoroscopy was utilized by the requesting physician.  No radiographic interpretation.   DG C-Arm 1-60 Min-No Report  Result Date: 08/06/2021 Fluoroscopy was utilized by the requesting physician.  No radiographic interpretation.   DG C-Arm 1-60 Min-No Report  Result Date: 08/06/2021 Fluoroscopy was utilized by the requesting physician.  No radiographic interpretation.   XR Cervical Spine 2 or 3 views  Result Date: 08/14/2021 2 view x-rays demonstrate 3 level cervical fusion C4-C7 with allograft and plate.  Good position and alignment of the screws and graft. Impression: Satisfactory 3 level cervical fusion C4-C7.   Discharge Instructions     Incentive spirometry RT   Complete by: As directed         Follow-up Information     [name], MD. Schedule an appointment as soon as possible for a visit in 1 week(s).   Specialty: Orthopedic Surgery Contact information: [address] [place] [address] [phone]                 Discharge Plan:  discharge to home  Disposition:     Signed: Zonia Kief  08/15/2021, 7:55 AM

## 2021-08-15 NOTE — Telephone Encounter (Signed)
I left voicemail requesting return call. 

## 2021-08-15 NOTE — Telephone Encounter (Signed)
Pt called requesting to pick up a neck brace. Pt states he has called around to see if he can purchase one and cant find one. Please call pt about this matter at [phone]

## 2021-08-15 NOTE — Telephone Encounter (Signed)
I spoke with patient. The person who has been helping to take care of him accidentally threw his shower collar away. He will come in this afternoon to pick up a new one.

## 2021-08-16 ENCOUNTER — Other Ambulatory Visit: Payer: Self-pay | Admitting: Orthopaedic Surgery

## 2021-08-16 ENCOUNTER — Telehealth: Payer: Self-pay | Admitting: Orthopaedic Surgery

## 2021-08-16 MED ORDER — TRAMADOL HCL 50 MG PO TABS: 50.0000 mg | ORAL_TABLET | Freq: Four times a day (QID) | ORAL | 0 refills | Status: DC | PRN

## 2021-08-16 NOTE — Telephone Encounter (Signed)
Please advise 

## 2021-08-16 NOTE — Telephone Encounter (Signed)
Patient called needing Rx refilled Oxycodone. The number to contact patient is [phone]

## 2021-08-17 NOTE — Telephone Encounter (Signed)
noted 

## 2021-09-19 ENCOUNTER — Ambulatory Visit (INDEPENDENT_AMBULATORY_CARE_PROVIDER_SITE_OTHER): Payer: 59

## 2021-09-19 ENCOUNTER — Ambulatory Visit (INDEPENDENT_AMBULATORY_CARE_PROVIDER_SITE_OTHER): Payer: 59 | Admitting: Orthopaedic Surgery

## 2021-09-19 ENCOUNTER — Other Ambulatory Visit: Payer: Self-pay

## 2021-09-19 ENCOUNTER — Encounter: Payer: Self-pay | Admitting: Orthopaedic Surgery

## 2021-09-19 VITALS — BP 123/70 | HR 52 | Ht 67.0 in | Wt 174.4 lb

## 2021-09-19 DIAGNOSIS — Z981 Arthrodesis status: Secondary | ICD-10-CM

## 2021-09-19 NOTE — Progress Notes (Signed)
Post-Op Visit Note   Patient: David Graves           Date of Birth: [birth year]           MRN: [id number] Visit Date: 09/19/2021 PCP: Mila Palmer, MD   Assessment & Plan: Patient turns post three-level cervical fusion incision looks good Steri-Strips removed.  X-rays show no motion on flexion-extension discontinue collar.  Follow-up for final visit in 3 months with single lateral cervical spine x-ray.  Patient is happy with surgical results.  Patient early retirement and can look for some part-time work if he wanted.  Chief Complaint:  Chief Complaint  Patient presents with   Neck - Follow-up    08/06/2021 C4-5, C5-6 ACDF   Visit Diagnoses:  1. Status post cervical spinal fusion     Plan: Return 3 months single lateral C-spine x-ray on return.  Follow-Up Instructions: No follow-ups on file.   Orders:  Orders Placed This Encounter  Procedures   XR Cervical Spine 2 or 3 views   No orders of the defined types were placed in this encounter.   Imaging: No results found.  PMFS History: Patient Active Problem List   Diagnosis Date Noted   S/P cervical spinal fusion 08/14/2021   Cervical spinal stenosis 08/06/2021   Other spondylosis with radiculopathy, cervical region 07/02/2021   Foraminal stenosis of cervical region 07/02/2021   GERD (gastroesophageal reflux disease) 06/28/2020   Hyperlipidemia 06/28/2020   Hypertension 06/28/2020   Bilateral hand pain 06/28/2020   Rheumatoid factor positive 06/28/2020   Anxiety 12/09/2018   Past Medical History:  Diagnosis Date   Anxiety    Asthma    no inhaler   Atopic dermatitis    Bee sting    BMI 29.0-29.9,adult    Cervical radiculopathy    Chronic insomnia    Complaint of paresthesia    Elevated blood pressure reading    Epididymitis    Family history of prostate cancer    GERD (gastroesophageal reflux disease)    Gout    Heart murmur    Hypertension    Lateral epicondylitis of right elbow    Pure  hypercholesterolemia    Seasonal allergic rhinitis due to pollen     Family History  Problem Relation Age of Onset   Healthy Son    Healthy Son    Lupus Niece     Past Surgical History:  Procedure Laterality Date   ANTERIOR CERVICAL DECOMP/DISCECTOMY FUSION N/A 08/06/2021   Procedure: ANTERIOR CERVICAL DISCECTOMY FUSION C4-5, C5-6, C6-7, ALLOGRAFT, PLATE;  Surgeon: [name], MD;  Location: [facility];  Service: Orthopedics;  Laterality: N/A;   COLONOSCOPY  10/20/2017   RADIOLOGY WITH ANESTHESIA N/A 10/17/2020   Procedure: MRI WITH ANESTHESIA  LUMBAR WITHOUT CONTRAST AND CERVICAL WITHOUT CONTRAST;  Surgeon: Radiologist, Medication, MD;  Location: [facility];  Service: Radiology;  Laterality: N/A;   UPPER GI ENDOSCOPY     VASECTOMY     Per patient   Social History   Occupational History   Not on file  Tobacco Use   Smoking status: Former    Types: Cigarettes    Quit date: 05/16/2012    Years since quitting: 9.3   Smokeless tobacco: Never  Vaping Use   Vaping Use: Never used  Substance and Sexual Activity   Alcohol use: Not Currently   Drug use: Yes    Frequency: 4.0 times per week    Types: Marijuana    Comment: uses daily   Sexual  activity: Yes

## 2021-12-18 ENCOUNTER — Ambulatory Visit (INDEPENDENT_AMBULATORY_CARE_PROVIDER_SITE_OTHER): Payer: 59 | Admitting: Orthopaedic Surgery

## 2021-12-18 ENCOUNTER — Ambulatory Visit (INDEPENDENT_AMBULATORY_CARE_PROVIDER_SITE_OTHER): Payer: 59

## 2021-12-18 ENCOUNTER — Encounter: Payer: Self-pay | Admitting: Orthopaedic Surgery

## 2021-12-18 VITALS — BP 131/78 | HR 61 | Ht 67.0 in | Wt 174.0 lb

## 2021-12-18 DIAGNOSIS — Z981 Arthrodesis status: Secondary | ICD-10-CM

## 2021-12-18 NOTE — Progress Notes (Signed)
? ?Office Visit Note ?  ?Patient: David Graves           ?Date of Birth: 1955/12/10           ?MRN: [id number] ?Visit Date: 12/18/2021 ?             ?Requested by: Mila Palmer, MD ?[address] ?[address] ?[place],  [place] [address] ?PCP: Mila Palmer, MD ? ? ?Assessment & Plan: ?Visit Diagnoses:  ?1. Status post cervical spinal fusion   ? ? ?Plan: Patient is happy the surgical result he is released from care I will check him back again on an as-needed basis. ? ?Follow-Up Instructions: Return if symptoms worsen or fail to improve.  ? ?Orders:  ?Orders Placed This Encounter  ?Procedures  ? XR Cervical Spine 2 or 3 views  ? ?No orders of the defined types were placed in this encounter. ? ? ? ? Procedures: ?No procedures performed ? ? ?Clinical Data: ?No additional findings. ? ? ?Subjective: ?Chief Complaint  ?Patient presents with  ? Neck - Routine Post Op  ? ? ?HPI 66 year old male returns post three-level cervical fusion C4-C7.  Surgery date 08/06/2021.  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  He states occasionally felt like something is crawling on the back of his neck but this is rare.  No gait disturbance.  Patient is retired x2 years and states he is back at normal activity. ? ?Review of Systems updated unchanged from surgery. ? ? ?Objective: ?Vital Signs: BP 131/78   Pulse 61   Ht 5\' 7"  (1.702 m)   Wt 174 lb (78.9 kg)   BMI 27.25 kg/m?  ? ?Physical Exam ?Constitutional:   ?   Appearance: He is well-developed.  ?HENT:  ?   Head: Normocephalic and atraumatic.  ?   Right Ear: External ear normal.  ?   Left Ear: External ear normal.  ?Eyes:  ?   Pupils: Pupils are equal, round, and reactive to light.  ?Neck:  ?   Thyroid: No thyromegaly.  ?   Trachea: No tracheal deviation.  ?Cardiovascular:  ?   Rate and Rhythm: Normal rate.  ?Pulmonary:  ?   Effort: Pulmonary effort is normal.  ?   Breath sounds: No wheezing.  ?Abdominal:  ?   General: Bowel sounds are normal.  ?   Palpations: Abdomen is soft.   ?Musculoskeletal:  ?   Cervical back: Neck supple.  ?Skin: ?   General: Skin is warm and dry.  ?   Capillary Refill: Capillary refill takes less than 2 seconds.  ?Neurological:  ?   Mental Status: He is alert and oriented to person, place, and time.  ?Psychiatric:     ?   Behavior: Behavior normal.     ?   Thought Content: Thought content normal.     ?   Judgment: Judgment normal.  ? ? ?Ortho Exam normal gait pattern no rash of exposed skin.  No atrophy upper extremities good range of motion cervical spine well-healed cervical incision. ? ?Specialty Comments:  ?No specialty comments available. ? ?Imaging: ?No results found. ? ? ?PMFS History: ?Patient Active Problem List  ? Diagnosis Date Noted  ? S/P cervical spinal fusion 08/14/2021  ? Cervical spinal stenosis 08/06/2021  ? Other spondylosis with radiculopathy, cervical region 07/02/2021  ? Foraminal stenosis of cervical region 07/02/2021  ? GERD (gastroesophageal reflux disease) 06/28/2020  ? Hyperlipidemia 06/28/2020  ? Hypertension 06/28/2020  ? Bilateral hand pain 06/28/2020  ? Rheumatoid factor positive  06/28/2020  ? Anxiety 12/09/2018  ? ?Past Medical History:  ?Diagnosis Date  ? Anxiety   ? Asthma   ? no inhaler  ? Atopic dermatitis   ? Bee sting   ? BMI 29.0-29.9,adult   ? Cervical radiculopathy   ? Chronic insomnia   ? Complaint of paresthesia   ? Elevated blood pressure reading   ? Epididymitis   ? Family history of prostate cancer   ? GERD (gastroesophageal reflux disease)   ? Gout   ? Heart murmur   ? Hypertension   ? Lateral epicondylitis of right elbow   ? Pure hypercholesterolemia   ? Seasonal allergic rhinitis due to pollen   ?  ?Family History  ?Problem Relation Age of Onset  ? Healthy Son   ? Healthy Son   ? Lupus Niece   ?  ?Past Surgical History:  ?Procedure Laterality Date  ? ANTERIOR CERVICAL DECOMP/DISCECTOMY FUSION N/A 08/06/2021  ? Procedure: ANTERIOR CERVICAL DISCECTOMY FUSION C4-5, C5-6, C6-7, ALLOGRAFT, PLATE;  Surgeon: [name],  MD;  Location: [facility];  Service: Orthopedics;  Laterality: N/A;  ? COLONOSCOPY  10/20/2017  ? RADIOLOGY WITH ANESTHESIA N/A 10/17/2020  ? Procedure: MRI WITH ANESTHESIA  LUMBAR WITHOUT CONTRAST AND CERVICAL WITHOUT CONTRAST;  Surgeon: Radiologist, Medication, MD;  Location: [facility];  Service: Radiology;  Laterality: N/A;  ? UPPER GI ENDOSCOPY    ? VASECTOMY    ? Per patient  ? ?Social History  ? ?Occupational History  ? Not on file  ?Tobacco Use  ? Smoking status: Former  ?  Types: Cigarettes  ?  Quit date: 05/16/2012  ?  Years since quitting: 9.5  ? Smokeless tobacco: Never  ?Vaping Use  ? Vaping Use: Never used  ?Substance and Sexual Activity  ? Alcohol use: Not Currently  ? Drug use: Yes  ?  Frequency: 4.0 times per week  ?  Types: Marijuana  ?  Comment: uses daily  ? Sexual activity: Yes  ? ? ? ? ? ? ?

## 2022-02-19 ENCOUNTER — Ambulatory Visit (INDEPENDENT_AMBULATORY_CARE_PROVIDER_SITE_OTHER): Payer: 59 | Admitting: Orthopaedic Surgery

## 2022-02-19 ENCOUNTER — Ambulatory Visit (INDEPENDENT_AMBULATORY_CARE_PROVIDER_SITE_OTHER): Payer: 59

## 2022-02-19 DIAGNOSIS — Z981 Arthrodesis status: Secondary | ICD-10-CM

## 2022-02-19 DIAGNOSIS — M545 Low back pain, unspecified: Secondary | ICD-10-CM | POA: Diagnosis not present

## 2022-05-03 ENCOUNTER — Ambulatory Visit (INDEPENDENT_AMBULATORY_CARE_PROVIDER_SITE_OTHER): Payer: Commercial Managed Care - HMO

## 2022-05-03 ENCOUNTER — Ambulatory Visit (INDEPENDENT_AMBULATORY_CARE_PROVIDER_SITE_OTHER): Payer: Commercial Managed Care - HMO | Admitting: Orthopaedic Surgery

## 2022-05-03 ENCOUNTER — Encounter: Payer: Self-pay | Admitting: Orthopaedic Surgery

## 2022-05-03 VITALS — BP 122/72 | HR 66 | Ht 67.0 in | Wt 156.8 lb

## 2022-05-03 DIAGNOSIS — Z981 Arthrodesis status: Secondary | ICD-10-CM | POA: Diagnosis not present

## 2022-05-03 DIAGNOSIS — M79642 Pain in left hand: Secondary | ICD-10-CM

## 2022-05-03 DIAGNOSIS — G5602 Carpal tunnel syndrome, left upper limb: Secondary | ICD-10-CM | POA: Diagnosis not present

## 2022-05-03 DIAGNOSIS — M79641 Pain in right hand: Secondary | ICD-10-CM

## 2022-05-03 NOTE — Progress Notes (Signed)
Office Visit Note   Patient: David Graves           Date of Birth: [birth year]           MRN: [id number] Visit Date: 05/03/2022              Requested by: Mila Palmer, MD [address] [place],  [place] [address] PCP: Mila Palmer, MD   Assessment & Plan: Visit Diagnoses:  1. S/P cervical spinal fusion   2. Bilateral hand pain   3. Carpal tunnel syndrome, left upper limb     Plan: Patient will get out his night splints that he has wear them on his wrist at night and intermittently during the day.  Recheck 5 weeks.  If he is having persistent problems we can consider possibly a carpal tunnel injection versus repeating electrical test.  Follow-Up Instructions: Return in about 5 weeks (around 06/07/2022).   Orders:  Orders Placed This Encounter  Procedures   XR Cervical Spine 2 or 3 views   No orders of the defined types were placed in this encounter.     Procedures: No procedures performed   Clinical Data: No additional findings.   Subjective: Chief Complaint  Patient presents with   Left Arm - Numbness    HPI 66 year old male returns now 10 months post three-level cervical fusion C4-C7 anteriorly with plate.  He did have some foraminal narrowing at C3-4 which was not surgically addressed since it was moderate.  He is also had carpal tunnel test in the past with positive nerve conduction velocities for mild carpal tunnel.  He has had pain more in his left than right in the forearm into his hand he has to shake his hands at times he is not really sure if it wakes him up at night.  Review of Systems patient retired.  He did have positive rheumatoid factor.  Denies myelopathic symptoms.  Positive anxiety hypertension hyperlipidemia.   Objective: Vital Signs: BP 122/72   Pulse 66   Ht 5\' 7"  (1.702 m)   Wt 156 lb 12.8 oz (71.1 kg)   BMI 24.56 kg/m   Physical Exam Constitutional:      Appearance: He is well-developed.  HENT:     Head:  Normocephalic and atraumatic.     Right Ear: External ear normal.     Left Ear: External ear normal.  Eyes:     Pupils: Pupils are equal, round, and reactive to light.  Neck:     Thyroid: No thyromegaly.     Trachea: No tracheal deviation.  Cardiovascular:     Rate and Rhythm: Normal rate.  Pulmonary:     Effort: Pulmonary effort is normal.     Breath sounds: No wheezing.  Abdominal:     General: Bowel sounds are normal.     Palpations: Abdomen is soft.  Musculoskeletal:     Cervical back: Neck supple.  Skin:    General: Skin is warm and dry.     Capillary Refill: Capillary refill takes less than 2 seconds.  Neurological:     Mental Status: He is alert and oriented to person, place, and time.  Psychiatric:        Behavior: Behavior normal.        Thought Content: Thought content normal.        Judgment: Judgment normal.     Ortho Exam positive Phalen's worse on left than right no thenar atrophy.  Positive proximal carpal compression test.  Median  nerve in the proximal forearm is normal ulnar nerve at the elbow is normal no brachial plexus tenderness well-healed cervical incision.  Specialty Comments:  No specialty comments available.  Imaging: XR Cervical Spine 2 or 3 views  Result Date: 05/03/2022 AP lateral cervical spine radiographs demonstrate 3 level cervical fusion with progressive graft incorporation.  All 3 levels appear to be progressively consolidating.  No screw loosening.  No adjacent level spurring noted. Impression: Satisfactory healing three-level cervical fusion C4-5, C5-6, C6-7.    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   GERD (gastroesophageal reflux disease) 06/28/2020   Hyperlipidemia 06/28/2020   Hypertension  06/28/2020   Bilateral hand pain 06/28/2020   Rheumatoid factor positive 06/28/2020   Anxiety 12/09/2018   Past Medical History:  Diagnosis Date   Anxiety    Asthma    no inhaler   Atopic dermatitis    Bee sting    BMI 29.0-29.9,adult    Cervical radiculopathy    Chronic insomnia    Complaint of paresthesia    Elevated blood pressure reading    Epididymitis    Family history of prostate cancer    GERD (gastroesophageal reflux disease)    Gout    Heart murmur    Hypertension    Lateral epicondylitis of right elbow    Pure hypercholesterolemia    Seasonal allergic rhinitis due to pollen     Family History  Problem Relation Age of Onset   Healthy Son    Healthy Son    Lupus Niece     Past Surgical History:  Procedure Laterality Date   ANTERIOR CERVICAL DECOMP/DISCECTOMY FUSION N/A 08/06/2021   Procedure: ANTERIOR CERVICAL DISCECTOMY FUSION C4-5, C5-6, C6-7, ALLOGRAFT, PLATE;  Surgeon: [name], MD;  Location: [facility];  Service: Orthopedics;  Laterality: N/A;   COLONOSCOPY  10/20/2017   RADIOLOGY WITH ANESTHESIA N/A 10/17/2020   Procedure: MRI WITH ANESTHESIA  LUMBAR WITHOUT CONTRAST AND CERVICAL WITHOUT CONTRAST;  Surgeon: Radiologist, Medication, MD;  Location: [facility];  Service: Radiology;  Laterality: N/A;   UPPER GI ENDOSCOPY     VASECTOMY     Per patient   Social History   Occupational History   Not on file  Tobacco Use   Smoking status: Former    Types: Cigarettes    Quit date: 05/16/2012    Years since quitting: 9.9   Smokeless tobacco: Never  Vaping Use   Vaping Use: Never used  Substance and Sexual Activity   Alcohol use: Not Currently   Drug use: Yes    Frequency: 4.0 times per week    Types: Marijuana    Comment: uses daily   Sexual activity: Yes

## 2022-05-04 IMAGING — RF DG CERVICAL SPINE 2 OR 3 VIEWS
1 series · 2 of 2 positions shown · non-contrast
Comparison: 09/27/2020

CLINICAL DATA: C4-C7 ACDF

EXAM:
CERVICAL SPINE - 2-3 VIEW

[Series 1: run · 2 of 2 slices shown]
[im 1/2]
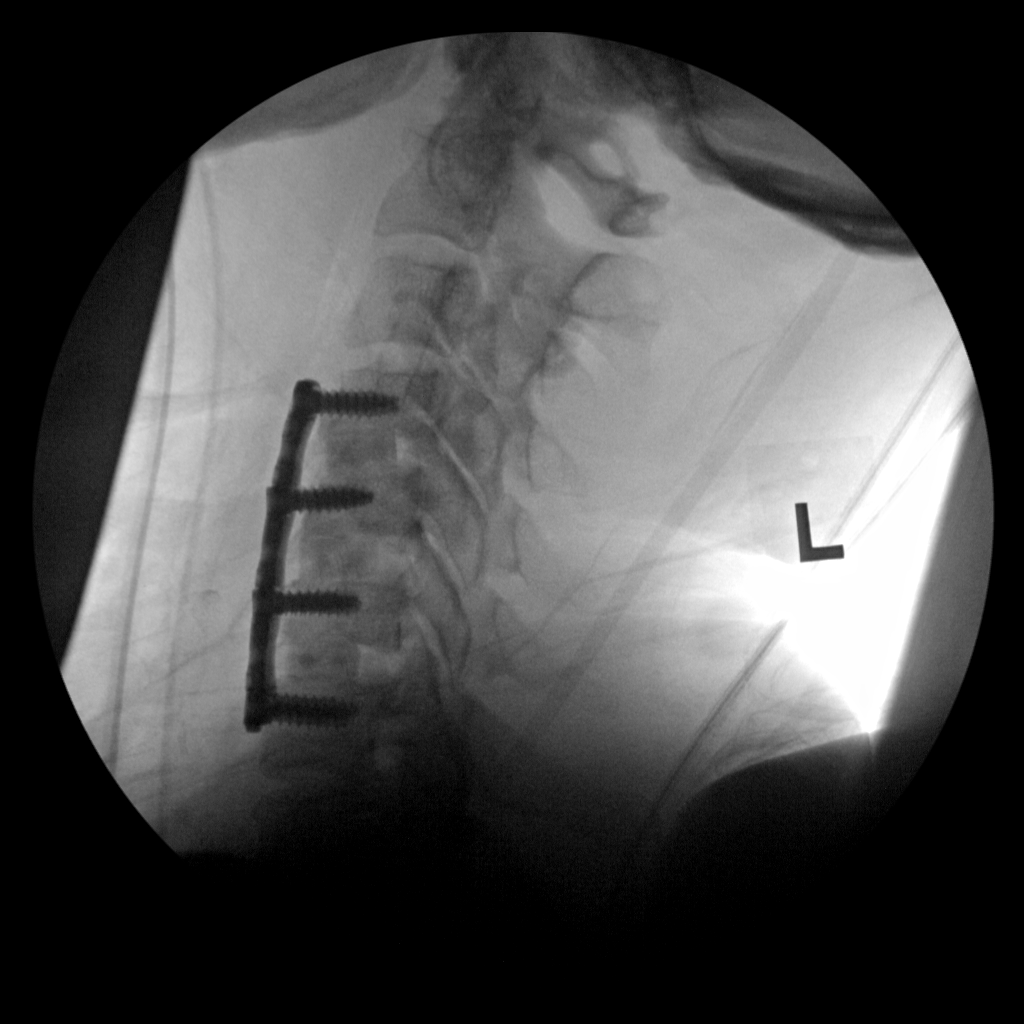
[im 2/2]
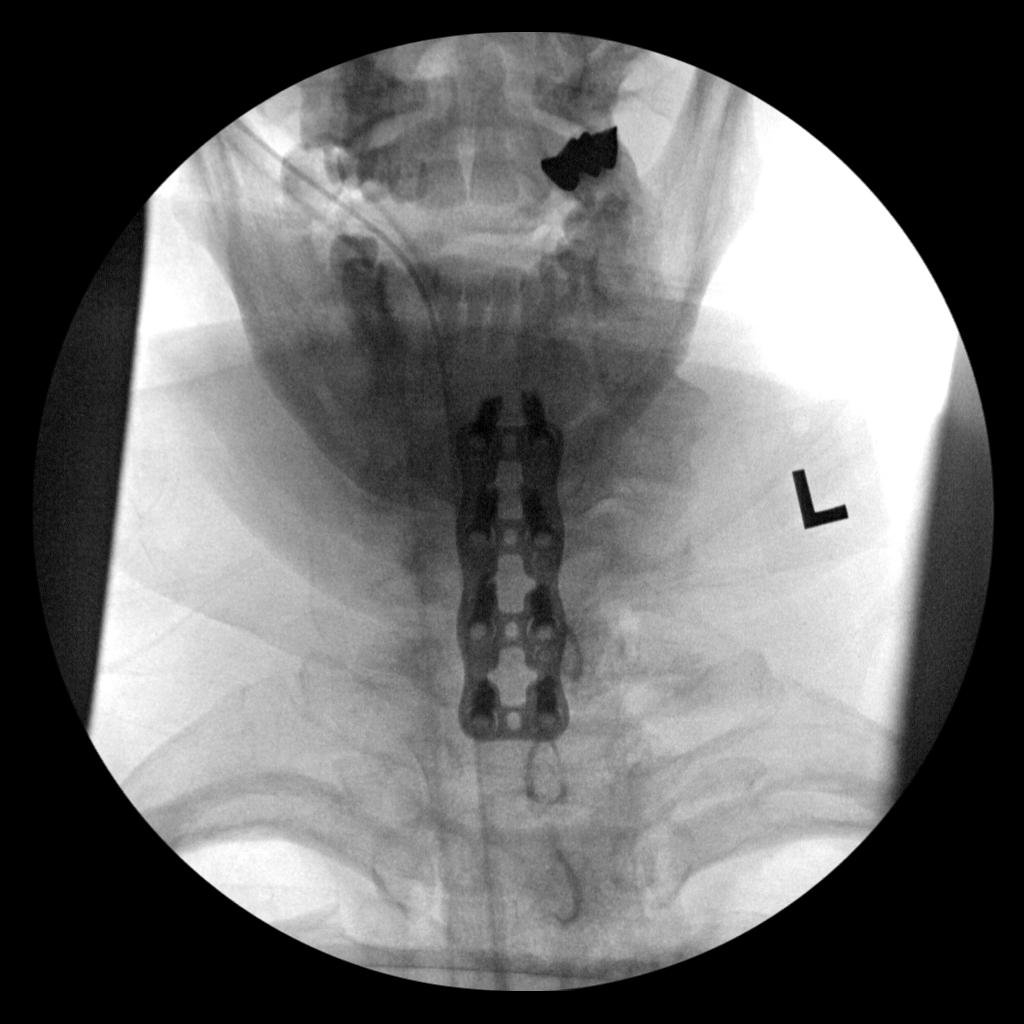

[2 of 2 positions shown; findings below may reference images not displayed]

FINDINGS: Changes of ACDF C4-C7. Normal alignment. No hardware bony
complicating feature.
IMPRESSION: C4-C7 ACDF.  No visible complicating feature.

## 2022-05-21 ENCOUNTER — Encounter: Payer: Self-pay | Admitting: Specialist

## 2022-05-21 NOTE — Progress Notes (Signed)
Patient not seen or examined.  

## 2022-05-21 NOTE — Progress Notes (Signed)
Patient was not seen.

## 2022-06-05 ENCOUNTER — Other Ambulatory Visit: Payer: Self-pay

## 2022-06-05 ENCOUNTER — Encounter: Payer: Self-pay | Admitting: Orthopaedic Surgery

## 2022-06-05 ENCOUNTER — Emergency Department (HOSPITAL_BASED_OUTPATIENT_CLINIC_OR_DEPARTMENT_OTHER)
Admission: EM | Admit: 2022-06-05 | Discharge: 2022-06-05 | Disposition: A | Discharge status: Home / Self Care | Payer: Commercial Managed Care - HMO | Attending: Emergency Medicine | Admitting: Emergency Medicine

## 2022-06-05 ENCOUNTER — Ambulatory Visit: Payer: Commercial Managed Care - HMO | Admitting: Orthopaedic Surgery

## 2022-06-05 ENCOUNTER — Encounter (HOSPITAL_BASED_OUTPATIENT_CLINIC_OR_DEPARTMENT_OTHER): Payer: Self-pay | Admitting: Emergency Medicine

## 2022-06-05 ENCOUNTER — Emergency Department (HOSPITAL_BASED_OUTPATIENT_CLINIC_OR_DEPARTMENT_OTHER): Payer: Commercial Managed Care - HMO

## 2022-06-05 ENCOUNTER — Encounter (HOSPITAL_COMMUNITY): Payer: Self-pay

## 2022-06-05 VITALS — BP 129/74 | HR 62 | Ht 67.0 in | Wt 155.0 lb

## 2022-06-05 DIAGNOSIS — R630 Anorexia: Secondary | ICD-10-CM | POA: Insufficient documentation

## 2022-06-05 DIAGNOSIS — E872 Acidosis, unspecified: Secondary | ICD-10-CM | POA: Diagnosis not present

## 2022-06-05 DIAGNOSIS — F419 Anxiety disorder, unspecified: Secondary | ICD-10-CM | POA: Diagnosis not present

## 2022-06-05 DIAGNOSIS — R1084 Generalized abdominal pain: Secondary | ICD-10-CM | POA: Diagnosis present

## 2022-06-05 DIAGNOSIS — Z79899 Other long term (current) drug therapy: Secondary | ICD-10-CM | POA: Diagnosis not present

## 2022-06-05 DIAGNOSIS — K2900 Acute gastritis without bleeding: Secondary | ICD-10-CM | POA: Insufficient documentation

## 2022-06-05 DIAGNOSIS — I10 Essential (primary) hypertension: Secondary | ICD-10-CM | POA: Insufficient documentation

## 2022-06-05 DIAGNOSIS — Z7982 Long term (current) use of aspirin: Secondary | ICD-10-CM | POA: Diagnosis not present

## 2022-06-05 DIAGNOSIS — G5602 Carpal tunnel syndrome, left upper limb: Secondary | ICD-10-CM

## 2022-06-05 DIAGNOSIS — Z20822 Contact with and (suspected) exposure to covid-19: Secondary | ICD-10-CM | POA: Insufficient documentation

## 2022-06-05 DIAGNOSIS — D72829 Elevated white blood cell count, unspecified: Secondary | ICD-10-CM | POA: Insufficient documentation

## 2022-06-05 LAB — CBC WITH DIFFERENTIAL/PLATELET
Abs Immature Granulocytes: 0.03 10*3/uL (ref 0.00–0.07)
Basophils Absolute: 0.1 10*3/uL (ref 0.0–0.1)
Basophils Relative: 1 %
Eosinophils Absolute: 0.1 10*3/uL (ref 0.0–0.5)
Eosinophils Relative: 1 %
HCT: 46.9 % (ref 39.0–52.0)
Hemoglobin: 16.1 g/dL (ref 13.0–17.0)
Immature Granulocytes: 0 %
Lymphocytes Relative: 46 %
Lymphs Abs: 5.1 10*3/uL — ABNORMAL HIGH (ref 0.7–4.0)
MCH: 31.7 pg (ref 26.0–34.0)
MCHC: 34.3 g/dL (ref 30.0–36.0)
MCV: 92.3 fL (ref 80.0–100.0)
Monocytes Absolute: 0.9 10*3/uL (ref 0.1–1.0)
Monocytes Relative: 8 %
Neutro Abs: 4.9 10*3/uL (ref 1.7–7.7)
Neutrophils Relative %: 44 %
Platelets: 215 10*3/uL (ref 150–400)
RBC: 5.08 MIL/uL (ref 4.22–5.81)
RDW: 14.6 % (ref 11.5–15.5)
WBC: 11.2 10*3/uL — ABNORMAL HIGH (ref 4.0–10.5)
nRBC: 0 % (ref 0.0–0.2)

## 2022-06-05 LAB — COMPREHENSIVE METABOLIC PANEL
ALT: 14 U/L (ref 0–44)
AST: 15 U/L (ref 15–41)
Albumin: 5 g/dL (ref 3.5–5.0)
Alkaline Phosphatase: 97 U/L (ref 38–126)
Anion gap: 17 — ABNORMAL HIGH (ref 5–15)
BUN: 11 mg/dL (ref 8–23)
CO2: 21 mmol/L — ABNORMAL LOW (ref 22–32)
Calcium: 10.4 mg/dL — ABNORMAL HIGH (ref 8.9–10.3)
Chloride: 105 mmol/L (ref 98–111)
Creatinine, Ser: 0.89 mg/dL (ref 0.61–1.24)
GFR, Estimated: >60 mL/min (ref >60)
Glucose, Bld: 115 mg/dL — ABNORMAL HIGH (ref 70–99)
Potassium: 2.9 mmol/L — ABNORMAL LOW (ref 3.5–5.1)
Sodium: 143 mmol/L (ref 135–145)
Total Bilirubin: 0.7 mg/dL (ref 0.3–1.2)
Total Protein: 8.6 g/dL — ABNORMAL HIGH (ref 6.5–8.1)

## 2022-06-05 LAB — URINALYSIS, ROUTINE W REFLEX MICROSCOPIC
Bilirubin Urine: NEGATIVE
Glucose, UA: NEGATIVE mg/dL
Hgb urine dipstick: NEGATIVE
Ketones, ur: NEGATIVE mg/dL
Leukocytes,Ua: NEGATIVE
Nitrite: NEGATIVE
Specific Gravity, Urine: 1.044 — ABNORMAL HIGH (ref 1.005–1.030)
pH: 8.5 — ABNORMAL HIGH (ref 5.0–8.0)

## 2022-06-05 LAB — LACTIC ACID, PLASMA
Lactic Acid, Venous: 4.2 mmol/L (ref 0.5–1.9)
Lactic Acid, Venous: 1.7 mmol/L (ref 0.5–1.9)

## 2022-06-05 LAB — SARS CORONAVIRUS 2 BY RT PCR: SARS Coronavirus 2 by RT PCR: NEGATIVE

## 2022-06-05 LAB — TROPONIN I (HIGH SENSITIVITY)
Troponin I (High Sensitivity): 4 ng/L (ref <18)
Troponin I (High Sensitivity): 6 ng/L (ref <18)

## 2022-06-05 MED ORDER — VANCOMYCIN HCL 500 MG IV SOLR
500.0000 mg | Freq: Once | INTRAVENOUS | Status: AC
  Administered 2022-06-05: 500 mg via INTRAVENOUS

## 2022-06-05 MED ORDER — HYDROMORPHONE HCL 1 MG/ML IJ SOLN
1.0000 mg | Freq: Once | INTRAMUSCULAR | Status: AC
  Administered 2022-06-05: 1 mg via INTRAVENOUS
  Filled 2022-06-05: qty 1

## 2022-06-05 MED ORDER — LACTATED RINGERS IV BOLUS
1000.0000 mL | Freq: Once | INTRAVENOUS | Status: AC
  Administered 2022-06-05: 1000 mL via INTRAVENOUS

## 2022-06-05 MED ORDER — VANCOMYCIN HCL IN DEXTROSE 1-5 GM/200ML-% IV SOLN: 1000.0000 mg | Freq: Once | INTRAVENOUS | Status: DC

## 2022-06-05 MED ORDER — SODIUM CHLORIDE 0.9 % IV SOLN
2.0000 g | Freq: Once | INTRAVENOUS | Status: AC
  Administered 2022-06-05: 2 g via INTRAVENOUS
  Filled 2022-06-05: qty 12.5

## 2022-06-05 MED ORDER — PANTOPRAZOLE SODIUM 40 MG PO TBEC: 40.0000 mg | DELAYED_RELEASE_TABLET | Freq: Every day | ORAL | 1 refills | Status: DC

## 2022-06-05 MED ORDER — SODIUM CHLORIDE 0.9 % IV SOLN: 1.0000 g | Freq: Once | INTRAVENOUS | Status: DC

## 2022-06-05 MED ORDER — SODIUM CHLORIDE 0.9 % IV SOLN: INTRAVENOUS | Status: DC | PRN

## 2022-06-05 MED ORDER — LACTATED RINGERS IV SOLN: INTRAVENOUS | Status: DC

## 2022-06-05 MED ORDER — ONDANSETRON HCL 4 MG/2ML IJ SOLN
4.0000 mg | Freq: Once | INTRAMUSCULAR | Status: AC
  Administered 2022-06-05: 4 mg via INTRAVENOUS
  Filled 2022-06-05: qty 2

## 2022-06-05 MED ORDER — VANCOMYCIN HCL IN DEXTROSE 1-5 GM/200ML-% IV SOLN
1000.0000 mg | Freq: Once | INTRAVENOUS | Status: AC
  Administered 2022-06-05: 1000 mg via INTRAVENOUS
  Filled 2022-06-05: qty 200

## 2022-06-05 MED ORDER — POTASSIUM CHLORIDE CRYS ER 20 MEQ PO TBCR
40.0000 meq | EXTENDED_RELEASE_TABLET | Freq: Once | ORAL | Status: AC
  Administered 2022-06-05: 40 meq via ORAL
  Filled 2022-06-05: qty 2

## 2022-06-05 MED ORDER — ONDANSETRON HCL 4 MG PO TABS: 4.0000 mg | ORAL_TABLET | Freq: Every day | ORAL | 0 refills | Status: DC | PRN

## 2022-06-05 MED ORDER — IOHEXOL 350 MG/ML SOLN
100.0000 mL | Freq: Once | INTRAVENOUS | Status: AC | PRN
  Administered 2022-06-05: 100 mL via INTRAVENOUS

## 2022-06-05 MED ORDER — PANTOPRAZOLE SODIUM 40 MG PO TBEC
40.0000 mg | DELAYED_RELEASE_TABLET | Freq: Once | ORAL | Status: AC
  Administered 2022-06-05: 40 mg via ORAL
  Filled 2022-06-05: qty 1

## 2022-06-05 MED ORDER — METRONIDAZOLE 500 MG/100ML IV SOLN
500.0000 mg | Freq: Once | INTRAVENOUS | Status: AC
  Administered 2022-06-05: 500 mg via INTRAVENOUS
  Filled 2022-06-05: qty 100

## 2022-06-05 NOTE — ED Notes (Signed)
Unsuccessful IV attempts, pt tolerated well. Tim, RN will attempt IV

## 2022-06-05 NOTE — ED Notes (Signed)
Reviewed AVS/discharge instruction with patient. Time allotted for and all questions answered. Patient is agreeable for d/c and escorted to ed exit by staff.  

## 2022-06-05 NOTE — ED Notes (Signed)
Pt called out due to tongue itching and lips "feeling funny", pt at the bedside using urinal, meds paused, primary RN Barnett Applebaum made aware. Speech is clear, oral secretions controlled, NAD distress noted. Pt denies SOB or difficulty swallowing at this time.

## 2022-06-05 NOTE — ED Notes (Signed)
Patient transported to CT 

## 2022-06-05 NOTE — ED Provider Notes (Signed)
[name] EMERGENCY DEPT Provider Note   CSN: [id number] Arrival date & time: 06/05/22  1221     History  Chief Complaint  Patient presents with   Abdominal Pain    David Graves is a 66 y.o. male.  Patient presents to the emergency department complaining of severe abdominal pain.  Patient's wife states patient began having severe pain and has been sweating and rest pain in his abdomen.  Patient reports he has a history of reflux.  He did not take his reflux medicine today.  he reports he has never had an episode like this in the past.  Patient reports he did vomit prior to coming to the emergency department patient has a past medical history of hypertension hyperlipidemia.  Patient has a history of a spinal fusion.  The history is provided by the patient. No language interpreter was used.  Abdominal Pain Pain location:  Generalized Pain quality: aching   Pain radiates to:  Does not radiate Pain severity:  Severe Onset quality:  Sudden Duration:  1 day Timing:  Constant Chronicity:  New Context: not alcohol use, not sick contacts, not suspicious food intake and not trauma   Relieved by:  Nothing Worsened by:  Nothing Ineffective treatments:  None tried Associated symptoms: anorexia   Risk factors: no alcohol abuse and has not had multiple surgeries        Home Medications Prior to Admission medications   Medication Sig Start Date End Date Taking? Authorizing Provider  acetaminophen (TYLENOL) 325 MG tablet Take 650 mg by mouth every 6 (six) hours as needed.   Yes [provider]  amLODipine (NORVASC) 10 MG tablet Take 10 mg by mouth daily. 03/26/20  Yes [provider]  aspirin EC 81 MG tablet Take 81 mg by mouth daily. Swallow whole.   Yes [provider]  atorvastatin (LIPITOR) 20 MG tablet Take 20 mg by mouth daily. 09/13/20  Yes [provider]  cetirizine (ZYRTEC) 10 MG tablet Take 10 mg by mouth daily. 12/20/20  Yes  [provider]  fluconazole (DIFLUCAN) 150 MG tablet Take 150 mg by mouth daily. 05/23/22  Yes [provider]  hydrOXYzine (ATARAX/VISTARIL) 25 MG tablet Take 25 mg by mouth 3 (three) times daily as needed for anxiety.   Yes [provider]  MAGNESIUM PO Take 2 tablets by mouth daily.   Yes [provider]  pantoprazole (PROTONIX) 40 MG tablet Take 40 mg by mouth daily. 05/21/21  Yes [provider]  tamsulosin (FLOMAX) 0.4 MG CAPS capsule Take 0.4 mg by mouth daily. 08/10/21  Yes [provider]  methocarbamol (ROBAXIN) 500 MG tablet Take 1 tablet (500 mg total) by mouth every 6 (six) hours as needed for muscle spasms. Patient not taking: Reported on 09/19/2021 08/06/21   David Sleight, PA-C  oxyCODONE-acetaminophen (PERCOCET) 5-325 MG tablet Take 1 tablet by mouth every 4 (four) hours as needed for severe pain. Patient not taking: Reported on 09/19/2021 08/06/21 08/06/22  David Sleight, PA-C  tadalafil (CIALIS) 5 MG tablet Take 5 mg by mouth daily. 06/03/22   [provider]  traMADol (ULTRAM) 50 MG tablet Take 1 tablet (50 mg total) by mouth every 6 (six) hours as needed. Patient not taking: Reported on 06/05/2022 08/16/21   David Manges, MD      Allergies    Bee venom, Cinnamon, Codeine, Colchicine, Hydrocodone-acetaminophen, Shellfish allergy, Sulfa antibiotics, and Penicillins    Review of Systems   Review of  Systems  Constitutional:  Positive for diaphoresis.  Gastrointestinal:  Positive for abdominal pain and anorexia.  All other systems reviewed and are negative.   Physical Exam Updated Vital Signs BP 133/70   Pulse 63   Temp 97.9 F (36.6 C) (Oral)   Resp (!) 22   SpO2 99%  Physical Exam Vitals and nursing note reviewed.  Constitutional:      General: He is in acute distress.     Appearance: He is ill-appearing and diaphoretic.     Comments: Patient has profuse sweating.  HENT:     Head: Normocephalic.   Eyes:     Extraocular Movements: Extraocular movements intact.  Cardiovascular:     Rate and Rhythm: Normal rate and regular rhythm.  Pulmonary:     Effort: Pulmonary effort is normal.  Abdominal:     General: Abdomen is flat. Bowel sounds are normal. There is no distension.     Tenderness: There is abdominal tenderness.     Hernia: No hernia is present.  Musculoskeletal:        General: Normal range of motion.     Cervical back: Normal range of motion.  Skin:    General: Skin is warm.  Neurological:     General: No focal deficit present.     Mental Status: He is alert and oriented to person, place, and time.  Psychiatric:        Mood and Affect: Mood is anxious.    ED Results / Procedures / Treatments   Labs (all labs ordered are listed, but only abnormal results are displayed) Labs Reviewed  CBC WITH DIFFERENTIAL/PLATELET - Abnormal; Notable for the following components:      Result Value   WBC 11.2 (*)    Lymphs Abs 5.1 (*)    All other components within normal limits  COMPREHENSIVE METABOLIC PANEL - Abnormal; Notable for the following components:   Potassium 2.9 (*)    CO2 21 (*)    Glucose, Bld 115 (*)    Calcium 10.4 (*)    Total Protein 8.6 (*)    Anion gap 17 (*)    All other components within normal limits  LACTIC ACID, PLASMA - Abnormal; Notable for the following components:   Lactic Acid, Venous 4.2 (*)    All other components within normal limits  URINALYSIS, ROUTINE W REFLEX MICROSCOPIC - Abnormal; Notable for the following components:   Specific Gravity, Urine 1.044 (*)    pH 8.5 (*)    Protein, ur TRACE (*)    All other components within normal limits  SARS CORONAVIRUS 2 BY RT PCR  CULTURE, BLOOD (ROUTINE X 2)  CULTURE, BLOOD (ROUTINE X 2)  LACTIC ACID, PLASMA  TROPONIN I (HIGH SENSITIVITY)  TROPONIN I (HIGH SENSITIVITY)    EKG EKG Interpretation  Date/Time:  Wednesday June 05 2022 12:31:00 EDT Ventricular Rate:  82 PR  Interval:  147 QRS Duration: 88 QT Interval:  382 QTC Calculation: 447 R Axis:   66 Text Interpretation: Sinus rhythm Biatrial enlargement Repol abnrm suggests ischemia, diffuse leads No significant change since last tracing Confirmed by Deno Etienne [phone]) on 06/05/2022 12:33:27 PM  Radiology CT Angio Chest/Abd/Pel for Dissection W and/or Wo Contrast  Result Date: 06/05/2022 CLINICAL DATA:  Chest or back pain, aortic dissection suspected. Sweating, vomiting and abdominal pain this morning after bowel movement EXAM: CT ANGIOGRAPHY CHEST, ABDOMEN AND PELVIS TECHNIQUE: Non-contrast CT of the chest was initially obtained. Multidetector CT imaging through the chest, abdomen and pelvis  was performed using the standard protocol during bolus administration of intravenous contrast. Multiplanar reconstructed images and MIPs were obtained and reviewed to evaluate the vascular anatomy. RADIATION DOSE REDUCTION: This exam was performed according to the departmental dose-optimization program which includes automated exposure control, adjustment of the mA and/or kV according to patient size and/or use of iterative reconstruction technique. CONTRAST:  100mL OMNIPAQUE IOHEXOL 350 MG/ML SOLN COMPARISON:  None Available. FINDINGS: CTA CHEST FINDINGS Cardiovascular: Non contrasted series demonstrates aortic and branch vessel atherosclerosis without evidence of intramural hematoma. Preferential opacification of the thoracic aorta postcontrast administration without evidence of thoracic aortic aneurysm or dissection. No central pulmonary embolus on this nondedicated study. Normal heart size. No pericardial effusion. Coronary artery calcifications. Mediastinum/Nodes: No supraclavicular adenopathy. No suspicious thyroid nodule. No pathologically enlarged mediastinal, hilar or axillary lymph nodes. The esophagus is grossly unremarkable. Lungs/Pleura: Minimal biapical pleuroparenchymal scarring. No suspicious pulmonary nodules or  masses. Hypoventilatory change in the dependent lungs. No pleural effusion. No pneumothorax. Musculoskeletal: Partially visualized anterior cervical fusion hardware. No acute osseous abnormality. Review of the MIP images confirms the above findings. CTA ABDOMEN AND PELVIS FINDINGS VASCULAR Aorta: Aortic atherosclerosis. Normal caliber aorta without aneurysm, dissection, vasculitis or significant stenosis. Celiac: Patent without evidence of aneurysm, dissection, vasculitis or significant stenosis. SMA: Patent without evidence of aneurysm, dissection, vasculitis or significant stenosis. Renals: Renal arteries are patent without evidence of aneurysm, dissection, vasculitis, fibromuscular dysplasia or significant stenosis. IMA: Patent without evidence of aneurysm, dissection, vasculitis or significant stenosis. Inflow: Patent without evidence of aneurysm, dissection, vasculitis or significant stenosis. Veins: No obvious venous abnormality within the limitations of this arterial phase study. Review of the MIP images confirms the above findings. NON-VASCULAR Hepatobiliary: No suspicious hepatic lesion. Gallbladder is unremarkable. No biliary ductal dilation. Pancreas: No pancreatic ductal dilation or evidence of acute inflammation. Spleen: No splenomegaly. Adrenals/Urinary Tract: Bilateral adrenal glands are within normal limits. No hydronephrosis. Kidneys demonstrate symmetric enhancement. Urinary bladder is unremarkable for degree of distension. Stomach/Bowel: No radiopaque enteric contrast material was administered. Mild wall thickening versus underdistention of the gastric antrum. No pathologic dilation of small or large bowel. Normal appendix and terminal ileum. No evidence of acute bowel inflammation. Lymphatic: No pathologically enlarged abdominal or pelvic lymph nodes. Reproductive: Mild enlargement of the prostate gland. Other: No significant abdominopelvic free fluid. Musculoskeletal: No acute osseous  abnormality. Multilevel degenerative changes spine. Review of the MIP images confirms the above findings. IMPRESSION: 1. No evidence of aortic dissection or aneurysm. 2. Mild wall thickening versus underdistention of the gastric antrum, which may reflect gastritis. 3. No evidence of bowel obstruction or acute bowel inflammation. 4. Aortic Atherosclerosis (ICD10-I70.0). Electronically Signed   By: Maudry MayhewJeffrey  Waltz M.D.   On: 06/05/2022 13:28    Procedures .Critical Care  Performed by: Elson AreasSofia, Sonjia Wilcoxson K, PA-C Authorized by: Elson AreasSofia, Rodgerick Gilliand K, PA-C   Critical care provider statement:    Critical care time (minutes):  30   Critical care start time:  06/05/2022 12:45 PM   Critical care end time:  06/05/2022 6:45 PM   Critical care time was exclusive of:  Separately billable procedures and treating other patients   Critical care was necessary to treat or prevent imminent or life-threatening deterioration of the following conditions:  Cardiac failure, circulatory failure, dehydration and sepsis   Critical care was time spent personally by me on the following activities:  Development of treatment plan with patient or surrogate, discussions with consultants, evaluation of patient's response to treatment, examination of patient, ordering and  review of laboratory studies, ordering and review of radiographic studies, ordering and performing treatments and interventions, pulse oximetry, re-evaluation of patient's condition and review of old charts   Care discussed with: admitting provider       Medications Ordered in ED Medications  lactated ringers infusion ( Intravenous New Bag/Given 06/05/22 1622)  ceFEPIme (MAXIPIME) 2 g in sodium chloride 0.9 % 100 mL IVPB (has no administration in time range)  vancomycin (VANCOCIN) IVPB 1000 mg/200 mL premix (0 mg Intravenous Stopped 06/05/22 1659)    Followed by  vancomycin (VANCOCIN) 500 mg in sodium chloride 0.9 % 100 mL IVPB (500 mg Intravenous New Bag/Given 06/05/22  1736)  0.9 %  sodium chloride infusion ( Intravenous New Bag/Given 06/05/22 1425)  lactated ringers bolus 1,000 mL ( Intravenous Stopped 06/05/22 1435)  HYDROmorphone (DILAUDID) injection 1 mg (1 mg Intravenous Given 06/05/22 1322)  ondansetron (ZOFRAN) injection 4 mg (4 mg Intravenous Given 06/05/22 1322)  iohexol (OMNIPAQUE) 350 MG/ML injection 100 mL (100 mLs Intravenous Contrast Given 06/05/22 1306)  lactated ringers bolus 1,000 mL ( Intravenous Stopped 06/05/22 1616)  metroNIDAZOLE (FLAGYL) IVPB 500 mg (0 mg Intravenous Stopped 06/05/22 1737)  potassium chloride SA (KLOR-CON M) CR tablet 40 mEq (40 mEq Oral Given 06/05/22 1732)    ED Course/ Medical Decision Making/ A&P                           Medical Decision Making Patient comes to the emergency department for evaluation of severe abdominal pain and vomiting.  Patient's wife reports that he began having severe pain and has been sweating.  Amount and/or Complexity of Data Reviewed Independent Historian: spouse    Details: Patient's wife reports patient has had episodes of abdominal pain with reflux but she has never seen him have anything like this External Data Reviewed:     Details: Patient has been had multiple orthopedic evaluations notes reviewed Labs: ordered. Decision-making details documented in ED Course.    Details: Labs ordered reviewed and interpreted patient has a lactic acid of 4.2.  He has an elevated white blood cell count of 11.2 Opponent is negative x2 Radiology: ordered and independent interpretation performed. Decision-making details documented in ED Course.    Details: CT chest abdomen and pelvis angio dissection that he ordered reviewed and interpreted.  TEE shows no evidence of aortic dissection or aneurysm there is mild wall thickening of the antrum ECG/medicine tests: ordered and independent interpretation performed. Decision-making details documented in ED Course.    Details: EKG is nonacute Discussion  of management or test interpretation with external provider(s): I spoke with hospitalist  Dr. Allena Katz who did not feel there was need for hospitalization.    Risk Prescription drug management. Risk Details: Dr. Adela Lank was in to see and examine the patient at time of arrival.  Patient received emergent CT chest angio abdomen and pelvis angio due to his appearance.  Patient was experiencing severe pain and profuse sweating.  Was given IV fluid bolus x2 L he was given IV antibiotics to cover for sepsis.  Patient reports improvement of symptoms he is no longer having severe pain. I observed pt extended period of time.  Pt had no further symptoms.  I advised pt to see his MD tomorrow for recheck.  Pt is to return if any further symptoms             Final Clinical Impression(s) / ED Diagnoses Final diagnoses:  Lactic acidosis  Generalized abdominal pain  Acute gastritis without hemorrhage, unspecified gastritis type    Rx / DC Orders ED Discharge Orders          Ordered    pantoprazole (PROTONIX) 40 MG tablet  Daily        06/05/22 2131    ondansetron (ZOFRAN) 4 MG tablet  Daily PRN        06/05/22 2131           An After Visit Summary was printed and given to the patient.    Elson Areas, Cordelia Poche 06/05/22 2131    Melene Plan, DO 06/06/22 [phone]

## 2022-06-05 NOTE — ED Triage Notes (Signed)
Pt was fine and then starting burping and had BM, then started sweating, vomiting, abd cramps.

## 2022-06-05 NOTE — Sepsis Progress Note (Signed)
eLink is following this Code Sepsis. °

## 2022-06-05 NOTE — ED Notes (Signed)
Unable to get 2nd IV line or 2nd blood cultures before starting ABX

## 2022-06-05 NOTE — ED Notes (Signed)
Patient called c/o tingling in lips and tongue and "feeling funny" After cefepime started. Denies SOB or difficulty swallowing speaking Cefepime stopped. Line flushed. Provider notified

## 2022-06-05 NOTE — ED Notes (Signed)
Patient reports "feeling better" after reaction to cefepime. Observed resting in bed, breathing with ease. Denies pain and all needs met at this time

## 2022-06-05 NOTE — ED Notes (Signed)
Will need rectal temp

## 2022-06-07 NOTE — Progress Notes (Signed)
Office Visit Note   Patient: David Graves           Date of Birth: [birth year]           MRN: [id number] Visit Date: 06/05/2022              Requested by: Jonathon Jordan, MD [address] [place],  [place] [address] PCP: Jonathon Jordan, MD   Assessment & Plan: Visit Diagnoses:  1. Carpal tunnel syndrome, left upper limb     Plan: Patient advised electrical studies from 04/25/2020 for bilateral carpal tunnel he failed splinting has had gradual progression of symptoms that wakes him up at night he is use splints without relief.  He like to proceed with left carpal tunnel release we will set this up as an outpatient.  Decision for surgery made.  Follow-Up Instructions: No follow-ups on file.   Orders:  No orders of the defined types were placed in this encounter.  No orders of the defined types were placed in this encounter.     Procedures: No procedures performed   Clinical Data: No additional findings.   Subjective: Chief Complaint  Patient presents with   Left Hand - Numbness, Follow-up   Right Hand - Follow-up, Numbness    HPI 66 year old male returns.  He is had cervical spine fusion past history of carpal tunnel syndrome and has fairly symmetrical symptoms he is right-hand dominant.  He has worn splints in the past been through therapy.  Patient's cervical fusion 3 levels doing well he still has a little bit of dysphagia with large pieces of solid food.  His grandchild is in a wheelchair with a diagnosis of muscular dystrophy that he lives.  Hand numbness bothers him at night makes him wake up and he like to proceed with left carpal tunnel release.  Review of Systems patient does have a hypertension history of rheumatoid factor without associated joint deformity.   Objective: Vital Signs: BP 129/74   Pulse 62   Ht 5\' 7"  (1.702 m)   Wt 155 lb (70.3 kg)   BMI 24.28 kg/m   Physical Exam Constitutional:      Appearance: He is well-developed.   HENT:     Head: Normocephalic and atraumatic.     Right Ear: External ear normal.     Left Ear: External ear normal.  Eyes:     Pupils: Pupils are equal, round, and reactive to light.  Neck:     Thyroid: No thyromegaly.     Trachea: No tracheal deviation.  Cardiovascular:     Rate and Rhythm: Normal rate.  Pulmonary:     Effort: Pulmonary effort is normal.     Breath sounds: No wheezing.  Abdominal:     General: Bowel sounds are normal.     Palpations: Abdomen is soft.  Musculoskeletal:     Cervical back: Neck supple.  Skin:    General: Skin is warm and dry.     Capillary Refill: Capillary refill takes less than 2 seconds.  Neurological:     Mental Status: He is alert and oriented to person, place, and time.  Psychiatric:        Behavior: Behavior normal.        Thought Content: Thought content normal.        Judgment: Judgment normal.     Ortho Exam bilateral carpal compression test positive.  Positive Phalen's test 30 seconds right and left.  Good cervical flexion extension without discomfort.  Specialty  Comments:  No specialty comments available.  Imaging: No results found.   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   GERD (gastroesophageal reflux disease) 06/28/2020   Hyperlipidemia 06/28/2020   Hypertension 06/28/2020   Bilateral hand pain 06/28/2020   Rheumatoid factor positive 06/28/2020   Anxiety 12/09/2018   Past Medical History:  Diagnosis Date   Anxiety    Asthma    no inhaler   Atopic dermatitis    Bee sting    BMI 29.0-29.9,adult    Cervical radiculopathy    Chronic insomnia    Complaint of paresthesia    Elevated blood pressure reading    Epididymitis    Family history of prostate cancer    GERD  (gastroesophageal reflux disease)    Gout    Heart murmur    Hypertension    Lateral epicondylitis of right elbow    Pure hypercholesterolemia    Seasonal allergic rhinitis due to pollen     Family History  Problem Relation Age of Onset   Healthy Son    Healthy Son    Lupus Niece     Past Surgical History:  Procedure Laterality Date   ANTERIOR CERVICAL DECOMP/DISCECTOMY FUSION N/A 08/06/2021   Procedure: ANTERIOR CERVICAL DISCECTOMY FUSION C4-5, C5-6, C6-7, ALLOGRAFT, PLATE;  Surgeon: [name], MD;  Location: [facility] OR;  Service: Orthopedics;  Laterality: N/A;   COLONOSCOPY  10/20/2017   RADIOLOGY WITH ANESTHESIA N/A 10/17/2020   Procedure: MRI WITH ANESTHESIA  LUMBAR WITHOUT CONTRAST AND CERVICAL WITHOUT CONTRAST;  Surgeon: Radiologist, Medication, MD;  Location: [facility] OR;  Service: Radiology;  Laterality: N/A;   UPPER GI ENDOSCOPY     VASECTOMY     Per patient   Social History   Occupational History   Not on file  Tobacco Use   Smoking status: Former    Types: Cigarettes    Quit date: 05/16/2012    Years since quitting: 10.0   Smokeless tobacco: Never  Vaping Use   Vaping Use: Never used  Substance and Sexual Activity   Alcohol use: Not Currently   Drug use: Yes    Frequency: 4.0 times per week    Types: Marijuana    Comment: uses daily   Sexual activity: Yes

## 2022-06-10 LAB — CULTURE, BLOOD (ROUTINE X 2)
Culture: NO GROWTH
Culture: NO GROWTH
Special Requests: ADEQUATE
Special Requests: ADEQUATE

## 2022-06-20 ENCOUNTER — Encounter (HOSPITAL_BASED_OUTPATIENT_CLINIC_OR_DEPARTMENT_OTHER): Payer: Self-pay | Admitting: Orthopaedic Surgery

## 2022-06-20 ENCOUNTER — Other Ambulatory Visit: Payer: Self-pay

## 2022-06-27 ENCOUNTER — Encounter (HOSPITAL_BASED_OUTPATIENT_CLINIC_OR_DEPARTMENT_OTHER)
Admission: RE | Admit: 2022-06-27 | Discharge: 2022-06-27 | Disposition: A | Discharge status: Home / Self Care | Payer: Commercial Managed Care - HMO | Source: Ambulatory Visit | Attending: Orthopaedic Surgery | Admitting: Orthopaedic Surgery

## 2022-06-27 DIAGNOSIS — Z01818 Encounter for other preprocedural examination: Secondary | ICD-10-CM | POA: Diagnosis present

## 2022-06-27 LAB — SURGICAL PCR SCREEN
MRSA, PCR: NEGATIVE
Staphylococcus aureus: NEGATIVE

## 2022-06-27 NOTE — Progress Notes (Signed)

## 2022-07-01 ENCOUNTER — Ambulatory Visit (HOSPITAL_BASED_OUTPATIENT_CLINIC_OR_DEPARTMENT_OTHER): Payer: Commercial Managed Care - HMO | Admitting: Anesthesiology

## 2022-07-01 ENCOUNTER — Other Ambulatory Visit: Payer: Self-pay

## 2022-07-01 ENCOUNTER — Ambulatory Visit (HOSPITAL_BASED_OUTPATIENT_CLINIC_OR_DEPARTMENT_OTHER)
Admission: RE | Admit: 2022-07-01 | Discharge: 2022-07-01 | Disposition: A | Discharge status: Home / Self Care | Payer: Commercial Managed Care - HMO | Attending: Orthopaedic Surgery | Admitting: Orthopaedic Surgery

## 2022-07-01 ENCOUNTER — Encounter (HOSPITAL_BASED_OUTPATIENT_CLINIC_OR_DEPARTMENT_OTHER): Payer: Self-pay | Admitting: Orthopaedic Surgery

## 2022-07-01 ENCOUNTER — Encounter (HOSPITAL_BASED_OUTPATIENT_CLINIC_OR_DEPARTMENT_OTHER)
Admission: RE | Disposition: A | Discharge status: Home / Self Care | Payer: Self-pay | Source: Home / Self Care | Attending: Orthopaedic Surgery

## 2022-07-01 DIAGNOSIS — F419 Anxiety disorder, unspecified: Secondary | ICD-10-CM | POA: Diagnosis not present

## 2022-07-01 DIAGNOSIS — Z87891 Personal history of nicotine dependence: Secondary | ICD-10-CM

## 2022-07-01 DIAGNOSIS — I10 Essential (primary) hypertension: Secondary | ICD-10-CM | POA: Insufficient documentation

## 2022-07-01 DIAGNOSIS — J45909 Unspecified asthma, uncomplicated: Secondary | ICD-10-CM | POA: Insufficient documentation

## 2022-07-01 DIAGNOSIS — F1290 Cannabis use, unspecified, uncomplicated: Secondary | ICD-10-CM | POA: Diagnosis not present

## 2022-07-01 DIAGNOSIS — K219 Gastro-esophageal reflux disease without esophagitis: Secondary | ICD-10-CM | POA: Diagnosis not present

## 2022-07-01 DIAGNOSIS — G5602 Carpal tunnel syndrome, left upper limb: Secondary | ICD-10-CM

## 2022-07-01 DIAGNOSIS — Z01818 Encounter for other preprocedural examination: Secondary | ICD-10-CM

## 2022-07-01 HISTORY — PX: CARPAL TUNNEL RELEASE: SHX101

## 2022-07-01 SURGERY — CARPAL TUNNEL RELEASE
Anesthesia: Monitor Anesthesia Care | Site: Wrist | Laterality: Left

## 2022-07-01 MED ORDER — OXYCODONE-ACETAMINOPHEN 5-325 MG PO TABS: 1.0000 | ORAL_TABLET | Freq: Four times a day (QID) | ORAL | 0 refills | Status: DC | PRN

## 2022-07-01 MED ORDER — FENTANYL CITRATE (PF) 100 MCG/2ML IJ SOLN
INTRAMUSCULAR | Status: DC | PRN
  Administered 2022-07-01: 100 ug via INTRAVENOUS

## 2022-07-01 MED ORDER — FENTANYL CITRATE (PF) 100 MCG/2ML IJ SOLN
INTRAMUSCULAR | Status: AC
  Filled 2022-07-01: qty 2

## 2022-07-01 MED ORDER — AMISULPRIDE (ANTIEMETIC) 5 MG/2ML IV SOLN: 10.0000 mg | Freq: Once | INTRAVENOUS | Status: DC | PRN

## 2022-07-01 MED ORDER — PROPOFOL 10 MG/ML IV BOLUS
INTRAVENOUS | Status: DC | PRN
  Administered 2022-07-01: 50 mg via INTRAVENOUS
  Administered 2022-07-01: 30 mg via INTRAVENOUS

## 2022-07-01 MED ORDER — 0.9 % SODIUM CHLORIDE (POUR BTL) OPTIME
TOPICAL | Status: DC | PRN
  Administered 2022-07-01: 200 mL

## 2022-07-01 MED ORDER — MIDAZOLAM HCL 2 MG/2ML IJ SOLN
INTRAMUSCULAR | Status: AC
  Filled 2022-07-01: qty 2

## 2022-07-01 MED ORDER — ONDANSETRON HCL 4 MG/2ML IJ SOLN
INTRAMUSCULAR | Status: DC | PRN
  Administered 2022-07-01: 4 mg via INTRAVENOUS

## 2022-07-01 MED ORDER — LIDOCAINE HCL 1 % IJ SOLN
INTRAMUSCULAR | Status: DC | PRN
  Administered 2022-07-01: 4 mL

## 2022-07-01 MED ORDER — PROMETHAZINE HCL 25 MG/ML IJ SOLN: 6.2500 mg | INTRAMUSCULAR | Status: DC | PRN

## 2022-07-01 MED ORDER — OXYCODONE HCL 5 MG PO TABS: 5.0000 mg | ORAL_TABLET | Freq: Once | ORAL | Status: DC | PRN

## 2022-07-01 MED ORDER — ACETAMINOPHEN 500 MG PO TABS
1000.0000 mg | ORAL_TABLET | Freq: Once | ORAL | Status: AC
  Administered 2022-07-01: 1000 mg via ORAL

## 2022-07-01 MED ORDER — KETOROLAC TROMETHAMINE 30 MG/ML IJ SOLN: 30.0000 mg | Freq: Once | INTRAMUSCULAR | Status: DC | PRN

## 2022-07-01 MED ORDER — LIDOCAINE HCL (PF) 1 % IJ SOLN
INTRAMUSCULAR | Status: AC
  Filled 2022-07-01: qty 30

## 2022-07-01 MED ORDER — MIDAZOLAM HCL 5 MG/5ML IJ SOLN
INTRAMUSCULAR | Status: DC | PRN
  Administered 2022-07-01: 2 mg via INTRAVENOUS

## 2022-07-01 MED ORDER — VANCOMYCIN HCL IN DEXTROSE 1-5 GM/200ML-% IV SOLN
1000.0000 mg | INTRAVENOUS | Status: AC
  Administered 2022-07-01: 1000 mg via INTRAVENOUS

## 2022-07-01 MED ORDER — LACTATED RINGERS IV SOLN: INTRAVENOUS | Status: DC

## 2022-07-01 MED ORDER — OXYCODONE HCL 5 MG/5ML PO SOLN: 5.0000 mg | Freq: Once | ORAL | Status: DC | PRN

## 2022-07-01 MED ORDER — LIDOCAINE 2% (20 MG/ML) 5 ML SYRINGE
INTRAMUSCULAR | Status: DC | PRN
  Administered 2022-07-01: 40 mg via INTRAVENOUS

## 2022-07-01 MED ORDER — FENTANYL CITRATE (PF) 100 MCG/2ML IJ SOLN: 25.0000 ug | INTRAMUSCULAR | Status: DC | PRN

## 2022-07-01 MED ORDER — LIDOCAINE 2% (20 MG/ML) 5 ML SYRINGE
INTRAMUSCULAR | Status: AC
  Filled 2022-07-01: qty 5

## 2022-07-01 MED ORDER — BUPIVACAINE HCL (PF) 0.25 % IJ SOLN
INTRAMUSCULAR | Status: AC
  Filled 2022-07-01: qty 30

## 2022-07-01 MED ORDER — ONDANSETRON HCL 4 MG/2ML IJ SOLN
INTRAMUSCULAR | Status: AC
  Filled 2022-07-01: qty 2

## 2022-07-01 MED ORDER — PROPOFOL 500 MG/50ML IV EMUL
INTRAVENOUS | Status: DC | PRN
  Administered 2022-07-01: 75 ug/kg/min via INTRAVENOUS

## 2022-07-01 SURGICAL SUPPLY — 39 items
APL PRP STRL LF DISP 70% ISPRP (MISCELLANEOUS) ×1
APL SKNCLS STERI-STRIP NONHPOA (GAUZE/BANDAGES/DRESSINGS)
BENZOIN TINCTURE PRP APPL 2/3 (GAUZE/BANDAGES/DRESSINGS) IMPLANT
BLADE SURG 15 STRL LF DISP TIS (BLADE) ×2 IMPLANT
BLADE SURG 15 STRL SS (BLADE) ×1
BNDG CMPR 9X4 STRL LF SNTH (GAUZE/BANDAGES/DRESSINGS) ×1
BNDG ELASTIC 4X5.8 VLCR STR LF (GAUZE/BANDAGES/DRESSINGS) ×2 IMPLANT
BNDG ESMARK 4X9 LF (GAUZE/BANDAGES/DRESSINGS) ×2 IMPLANT
CHLORAPREP W/TINT 26 (MISCELLANEOUS) IMPLANT
CORD BIPOLAR FORCEPS 12FT (ELECTRODE) ×2 IMPLANT
COVER BACK TABLE 60X90IN (DRAPES) ×2 IMPLANT
COVER MAYO STAND STRL (DRAPES) ×2 IMPLANT
CUFF TOURN SGL QUICK 18X4 (TOURNIQUET CUFF) ×2 IMPLANT
DRAPE EXTREMITY T 121X128X90 (DISPOSABLE) ×2 IMPLANT
DRAPE SURG 17X23 STRL (DRAPES) ×2 IMPLANT
DURAPREP 26ML APPLICATOR (WOUND CARE) ×2 IMPLANT
GAUZE SPONGE 4X4 12PLY STRL (GAUZE/BANDAGES/DRESSINGS) ×2 IMPLANT
GAUZE XEROFORM 1X8 LF (GAUZE/BANDAGES/DRESSINGS) ×2 IMPLANT
GLOVE BIO SURGEON STRL SZ7.5 (GLOVE) ×2 IMPLANT
GLOVE BIOGEL PI IND STRL 8 (GLOVE) ×2 IMPLANT
GOWN STRL REUS W/ TWL LRG LVL3 (GOWN DISPOSABLE) ×2 IMPLANT
GOWN STRL REUS W/ TWL XL LVL3 (GOWN DISPOSABLE) ×2 IMPLANT
GOWN STRL REUS W/TWL LRG LVL3 (GOWN DISPOSABLE) ×1
GOWN STRL REUS W/TWL XL LVL3 (GOWN DISPOSABLE) ×1
LOOP VESSEL MAXI BLUE (MISCELLANEOUS) IMPLANT
NDL HYPO 25X1 1.5 SAFETY (NEEDLE) ×2 IMPLANT
NEEDLE HYPO 25X1 1.5 SAFETY (NEEDLE) ×1 IMPLANT
NS IRRIG 1000ML POUR BTL (IV SOLUTION) ×2 IMPLANT
PACK BASIN DAY SURGERY FS (CUSTOM PROCEDURE TRAY) ×2 IMPLANT
PAD CAST 3X4 CTTN HI CHSV (CAST SUPPLIES) ×2 IMPLANT
PADDING CAST ABS COTTON 4X4 ST (CAST SUPPLIES) ×2 IMPLANT
PADDING CAST COTTON 3X4 STRL (CAST SUPPLIES) ×1
STOCKINETTE 4X48 STRL (DRAPES) ×2 IMPLANT
STRIP CLOSURE SKIN 1/2X4 (GAUZE/BANDAGES/DRESSINGS) IMPLANT
SUT ETHILON 3 0 PS 1 (SUTURE) ×2 IMPLANT
SYR BULB EAR ULCER 3OZ GRN STR (SYRINGE) ×2 IMPLANT
SYR CONTROL 10ML LL (SYRINGE) IMPLANT
TOWEL GREEN STERILE FF (TOWEL DISPOSABLE) ×2 IMPLANT
UNDERPAD 30X36 HEAVY ABSORB (UNDERPADS AND DIAPERS) ×2 IMPLANT

## 2022-07-01 NOTE — Transfer of Care (Signed)
Immediate Anesthesia Transfer of Care Note  Patient: David Graves  Procedure(s) Performed: LEFT CARPAL TUNNEL RELEASE (Left: Wrist)  Patient Location: PACU  Anesthesia Type:MAC  Level of Consciousness: sedated  Airway & Oxygen Therapy: Patient Spontanous Breathing and Patient connected to face mask oxygen  Post-op Assessment: Report given to RN and Post -op Vital signs reviewed and stable  Post vital signs: Reviewed and stable  Last Vitals:  Vitals Value Taken Time  BP 123/69 07/01/22 1103  Temp    Pulse 63 07/01/22 1106  Resp 17 07/01/22 1106  SpO2 100 % 07/01/22 1106  Vitals shown include unvalidated device data.  Last Pain:  Vitals:   07/01/22 0917  TempSrc: Oral  PainSc: 0-No pain      Patients Stated Pain Goal: 3 (24/49/75 3005)  Complications: No notable events documented.

## 2022-07-01 NOTE — Anesthesia Preprocedure Evaluation (Addendum)
Anesthesia Evaluation  Patient identified by MRN, date of birth, ID band Patient awake    Reviewed: Allergy & Precautions, NPO status , Patient's Chart, lab work & pertinent test results  Airway Mallampati: II  TM Distance: >3 FB Neck ROM: Limited    Dental no notable dental hx.    Pulmonary asthma , former smoker   Pulmonary exam normal        Cardiovascular hypertension, Pt. on medications Normal cardiovascular exam     Neuro/Psych   Anxiety      Neuromuscular disease    GI/Hepatic ,GERD  Medicated and Controlled,,(+)     substance abuse    Endo/Other  negative endocrine ROS    Renal/GU negative Renal ROS     Musculoskeletal  (+) Arthritis ,    Abdominal   Peds  Hematology negative hematology ROS (+)   Anesthesia Other Findings left carpal tunnel syndrome  Reproductive/Obstetrics                             Anesthesia Physical Anesthesia Plan  ASA: 2  Anesthesia Plan: MAC   Post-op Pain Management:    Induction: Intravenous  PONV Risk Score and Plan: 1 and Ondansetron, Dexamethasone, Propofol infusion, Midazolam and Treatment may vary due to age or medical condition  Airway Management Planned: Simple Face Mask  Additional Equipment:   Intra-op Plan:   Post-operative Plan:   Informed Consent: I have reviewed the patients History and Physical, chart, labs and discussed the procedure including the risks, benefits and alternatives for the proposed anesthesia with the patient or authorized representative who has indicated his/her understanding and acceptance.     Dental advisory given  Plan Discussed with: CRNA  Anesthesia Plan Comments:        Anesthesia Quick Evaluation

## 2022-07-01 NOTE — Anesthesia Postprocedure Evaluation (Signed)
Anesthesia Post Note  Patient: David Graves  Procedure(s) Performed: LEFT CARPAL TUNNEL RELEASE (Left: Wrist)     Patient location during evaluation: PACU Anesthesia Type: MAC Level of consciousness: awake Pain management: pain level controlled Vital Signs Assessment: post-procedure vital signs reviewed and stable Respiratory status: spontaneous breathing, nonlabored ventilation, respiratory function stable and patient connected to nasal cannula oxygen Cardiovascular status: stable and blood pressure returned to baseline Postop Assessment: no apparent nausea or vomiting Anesthetic complications: no   No notable events documented.  Last Vitals:  Vitals:   07/01/22 1130 07/01/22 1139  BP: 128/73 (!) 142/84  Pulse: (!) 46 (!) 53  Resp: 11 18  Temp:  36.5 C  SpO2: 99% 97%    Last Pain:  Vitals:   07/01/22 1139  TempSrc:   PainSc: 0-No pain                 Ashtin Melichar P Kimerly Rowand

## 2022-07-01 NOTE — H&P (Signed)
Patient: David Graves                                          Date of Birth: [birth year]                                                      MRN: [id number] Visit Date: 06/05/2022                                                                     Requested by: Mila Palmer, MD [address] [place],  [place] [address] PCP: Mila Palmer, MD     Assessment & Plan: Visit Diagnoses:  1. Carpal tunnel syndrome, left upper limb       Plan: Patient advised electrical studies from 04/25/2020 for bilateral carpal tunnel he failed splinting has had gradual progression of symptoms that wakes him up at night he is use splints without relief.  He like to proceed with left carpal tunnel release we will set this up as an outpatient.  Decision for surgery made.   Follow-Up Instructions: No follow-ups on file.    Orders:  No orders of the defined types were placed in this encounter.   No orders of the defined types were placed in this encounter.        Procedures: No procedures performed     Clinical Data: No additional findings.     Subjective:    Chief Complaint  Patient presents with   Left Hand - Numbness, Follow-up   Right Hand - Follow-up, Numbness      HPI 66 year old male returns.  He is had cervical spine fusion past history of carpal tunnel syndrome and has fairly symmetrical symptoms he is right-hand dominant.  He has worn splints in the past been through therapy.  Patient's cervical fusion 3 levels doing well he still has a little bit of dysphagia with large pieces of solid food.  His grandchild is in a wheelchair with a diagnosis of muscular dystrophy that he lives.  Hand numbness bothers him at night makes him wake up and he like to proceed with left carpal tunnel release.   Review of Systems patient does have a hypertension history of rheumatoid factor without associated joint deformity.     Objective: Vital Signs: BP 129/74   Pulse 62   Ht 5\' 7"   (1.702 m)   Wt 155 lb (70.3 kg)   BMI 24.28 kg/m    Physical Exam Constitutional:      Appearance: He is well-developed.  HENT:     Head: Normocephalic and atraumatic.     Right Ear: External ear normal.     Left Ear: External ear normal.  Eyes:     Pupils: Pupils are equal, round, and reactive to light.  Neck:     Thyroid: No thyromegaly.     Trachea: No tracheal deviation.  Cardiovascular:     Rate and Rhythm: Normal rate.  Pulmonary:     Effort:  Pulmonary effort is normal.     Breath sounds: No wheezing.  Abdominal:     General: Bowel sounds are normal.     Palpations: Abdomen is soft.  Musculoskeletal:     Cervical back: Neck supple.  Skin:    General: Skin is warm and dry.     Capillary Refill: Capillary refill takes less than 2 seconds.  Neurological:     Mental Status: He is alert and oriented to person, place, and time.  Psychiatric:        Behavior: Behavior normal.        Thought Content: Thought content normal.        Judgment: Judgment normal.        Ortho Exam bilateral carpal compression test positive.  Positive Phalen's test 30 seconds right and left.  Good cervical flexion extension without discomfort.   Specialty Comments:  No specialty comments available.   Imaging: No results found.     PMFS History:     Patient Active Problem List    Diagnosis Date Noted   Carpal tunnel syndrome, left upper limb 05/03/2022   S/P cervical spinal fusion 08/14/2021   Other spondylosis with radiculopathy, cervical region 07/02/2021   Foraminal stenosis of cervical region 07/02/2021   GERD (gastroesophageal reflux disease) 06/28/2020   Hyperlipidemia 06/28/2020   Hypertension 06/28/2020   Bilateral hand pain 06/28/2020   Rheumatoid factor positive 06/28/2020   Anxiety 12/09/2018        Past Medical History:  Diagnosis Date   Anxiety     Asthma      no inhaler   Atopic dermatitis     Bee sting     BMI 29.0-29.9,adult     Cervical radiculopathy      Chronic insomnia     Complaint of paresthesia     Elevated blood pressure reading     Epididymitis     Family history of prostate cancer     GERD (gastroesophageal reflux disease)     Gout     Heart murmur     Hypertension     Lateral epicondylitis of right elbow     Pure hypercholesterolemia     Seasonal allergic rhinitis due to pollen           Family History  Problem Relation Age of Onset   Healthy Son     Healthy Son     Lupus Niece           Past Surgical History:  Procedure Laterality Date   ANTERIOR CERVICAL DECOMP/DISCECTOMY FUSION N/A 08/06/2021    Procedure: ANTERIOR CERVICAL DISCECTOMY FUSION C4-5, C5-6, C6-7, ALLOGRAFT, PLATE;  Surgeon: Marybelle Killings, MD;  Location: [facility];  Service: Orthopedics;  Laterality: N/A;   COLONOSCOPY   10/20/2017   RADIOLOGY WITH ANESTHESIA N/A 10/17/2020    Procedure: MRI WITH ANESTHESIA  LUMBAR WITHOUT CONTRAST AND CERVICAL WITHOUT CONTRAST;  Surgeon: Radiologist, Medication, MD;  Location: [facility];  Service: Radiology;  Laterality: N/A;   UPPER GI ENDOSCOPY       VASECTOMY        Per patient    Social History         Occupational History   Not on file  Tobacco Use   Smoking status: Former      Types: Cigarettes      Quit date: 05/16/2012      Years since quitting: 10.0   Smokeless tobacco: Never  Vaping Use   Vaping Use: Never used  Substance and Sexual Activity   Alcohol use: Not Currently   Drug use: Yes      Frequency: 4.0 times per week      Types: Marijuana      Comment: uses daily   Sexual activity: Yes

## 2022-07-01 NOTE — Op Note (Signed)
Pre and postop diagnosis: Left carpal tunnel syndrome  Procedure left carpal tunnel release  Surgeon: Rodell Perna, MD  Anesthesia: IV sedation +6 cc Marcaine/lidocaine local one-to-one.  Tourniquet: 250 x 6 minutes.  Findings median nerve compression in the carpal canal.  Brief history 66 year old male had electrical studies several years ago with moderate to severe carpal tunnel.  He has had progressive symptoms and now presents for left carpal tunnel release due to increasing symptoms.  Procedure: After induction of IV sedation proximal arm tourniquet with patient's Betadine allergy prepping with ChloraPrep extremity sheets drapes were applied timeout procedure was completed sterile skin marker.  Marcaine was infiltrated with lidocaine one-to-one total of 6 cc in the planned incision.  Hand and arm was elevated wrapped in Esmarch tourniquet inflated.  Incision was made following the outlined skin plan based on cardinal Line lines.  Subtenons tissue was divided.  Bipolar cautery was used to control small bleeding.  Palmaris brevis fascia was divided palmaris brevis was divided and the transverse carpal ligament was divided along the ulnar aspect of the median nerve.  There was hourglass deformity of the nerve chronic tenosynovitis changes of the flexor tendons.  No masses are present on fingertip could be placed proximal as well as distal with the palmar fat and vascular arch was noted.  No areas remaining compression.  Tourniquet was deflated hemostasis obtained irrigation with saline solution.  3-0 nylon interrupted closure Xeroform 4 x 4's web roll 20 nurse and web roll with Ace wrap was applied.  Outpatient surgery is appropriate for treatment of this condition also follow-up 1 week.

## 2022-07-01 NOTE — Discharge Instructions (Addendum)
Keep hand elevated above your heart minimal use of left hand.  You can place a bag over your dressing if she like to take a shower and then remove it after you shower.  Bag can be secured with tape at the forearm or with rubber bands.  See Dr. Lorin Mercy in 1 week.  Pain medication has been sent into your pharmacy Percocet for pain.  See Dr. Lorin Mercy in 1 week.  Post Anesthesia Home Care Instructions  Activity: Get plenty of rest for the remainder of the day. A responsible individual must stay with you for 24 hours following the procedure.  For the next 24 hours, DO NOT: -Drive a car -Paediatric nurse -Drink alcoholic beverages -Take any medication unless instructed by your physician -Make any legal decisions or sign important papers.  Meals: Start with liquid foods such as gelatin or soup. Progress to regular foods as tolerated. Avoid greasy, spicy, heavy foods. If nausea and/or vomiting occur, drink only clear liquids until the nausea and/or vomiting subsides. Call your physician if vomiting continues.  Special Instructions/Symptoms: Your throat may feel dry or sore from the anesthesia or the breathing tube placed in your throat during surgery. If this causes discomfort, gargle with warm salt water. The discomfort should disappear within 24 hours.  If you had a scopolamine patch placed behind your ear for the management of post- operative nausea and/or vomiting:  1. The medication in the patch is effective for 72 hours, after which it should be removed.  Wrap patch in a tissue and discard in the trash. Wash hands thoroughly with soap and water. 2. You may remove the patch earlier than 72 hours if you experience unpleasant side effects which may include dry mouth, dizziness or visual disturbances. 3. Avoid touching the patch. Wash your hands with soap and water after contact with the patch.

## 2022-07-01 NOTE — Interval H&P Note (Signed)
History and Physical Interval Note:  07/01/2022 10:21 AM  David Graves  has presented today for surgery, with the diagnosis of Left carpal tunnel syndrome.  The various methods of treatment have been discussed with the patient and family. After consideration of risks, benefits and other options for treatment, the patient has consented to  Procedure(s): LEFT CARPAL TUNNEL RELEASE (Left) as a surgical intervention.  The patient's history has been reviewed, patient examined, no change in status, stable for surgery.  I have reviewed the patient's chart and labs.  Questions were answered to the patient's satisfaction.     Marybelle Killings

## 2022-07-02 ENCOUNTER — Encounter (HOSPITAL_BASED_OUTPATIENT_CLINIC_OR_DEPARTMENT_OTHER): Payer: Self-pay | Admitting: Orthopaedic Surgery

## 2022-07-09 ENCOUNTER — Encounter: Payer: Self-pay | Admitting: Orthopaedic Surgery

## 2022-07-09 ENCOUNTER — Ambulatory Visit (INDEPENDENT_AMBULATORY_CARE_PROVIDER_SITE_OTHER): Payer: Commercial Managed Care - HMO | Admitting: Orthopaedic Surgery

## 2022-07-09 VITALS — BP 135/78 | HR 56 | Ht 67.0 in | Wt 154.0 lb

## 2022-07-09 DIAGNOSIS — G5602 Carpal tunnel syndrome, left upper limb: Secondary | ICD-10-CM

## 2022-07-09 MED ORDER — OXYCODONE-ACETAMINOPHEN 5-325 MG PO TABS: 1.0000 | ORAL_TABLET | Freq: Four times a day (QID) | ORAL | 0 refills | Status: DC | PRN

## 2022-07-09 NOTE — Progress Notes (Signed)
Post-Op Visit Note   Patient: David Graves           Date of Birth: August 07, 1956           MRN: [id number] Visit Date: 07/09/2022 PCP: Jonathon Jordan, MD   Assessment & Plan: Follow-up carpal tunnel release incision looks good.  He can remove the wrist splint which we applied today to wash his hand or shower.  Return 1 week for probable suture removal.  Chief Complaint:  Chief Complaint  Patient presents with   Left Hand - Routine Post Op    07/01/2022 Left CTR   Visit Diagnoses:  1. Carpal tunnel syndrome, left upper limb     Plan: return one week  Follow-Up Instructions: Return in about 1 week (around 07/16/2022).   Orders:  No orders of the defined types were placed in this encounter.  Meds ordered this encounter  Medications   oxyCODONE-acetaminophen (PERCOCET/ROXICET) 5-325 MG tablet    Sig: Take 1-2 tablets by mouth every 6 (six) hours as needed for severe pain.    Dispense:  20 tablet    Refill:  0    Imaging: No results found.  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   GERD (gastroesophageal reflux disease) 06/28/2020   Hyperlipidemia 06/28/2020   Hypertension 06/28/2020   Bilateral hand pain 06/28/2020   Rheumatoid factor positive 06/28/2020   Anxiety 12/09/2018   Past Medical History:  Diagnosis Date   Anxiety    Asthma    no inhaler   Atopic dermatitis    Bee sting    BMI 29.0-29.9,adult    Cervical radiculopathy    Chronic insomnia    Complaint of paresthesia    Elevated blood pressure reading    Epididymitis    Family history of prostate cancer    GERD (gastroesophageal reflux disease)    Gout    Heart murmur    Hypertension    Lateral epicondylitis of right elbow    Pure hypercholesterolemia     Seasonal allergic rhinitis due to pollen     Family History  Problem Relation Age of Onset   Healthy Son    Healthy Son    Lupus Niece     Past Surgical History:  Procedure Laterality Date   ANTERIOR CERVICAL DECOMP/DISCECTOMY FUSION N/A 08/06/2021   Procedure: ANTERIOR CERVICAL DISCECTOMY FUSION C4-5, C5-6, C6-7, ALLOGRAFT, PLATE;  Surgeon: Marybelle Killings, MD;  Location: [facility];  Service: Orthopedics;  Laterality: N/A;   CARPAL TUNNEL RELEASE Left 07/01/2022   Procedure: LEFT CARPAL TUNNEL RELEASE;  Surgeon: Marybelle Killings, MD;  Location: [facility];  Service: Orthopedics;  Laterality: Left;   COLONOSCOPY  10/20/2017   RADIOLOGY WITH ANESTHESIA N/A 10/17/2020   Procedure: MRI WITH ANESTHESIA  LUMBAR WITHOUT CONTRAST AND CERVICAL WITHOUT CONTRAST;  Surgeon: Radiologist, Medication, MD;  Location: [facility];  Service: Radiology;  Laterality: N/A;   UPPER GI ENDOSCOPY     VASECTOMY     Per patient   Social History   Occupational History   Not on file  Tobacco Use   Smoking status: Former    Types: Cigarettes    Quit date: 05/16/2012    Years since quitting: 10.1   Smokeless tobacco: Never  Vaping Use   Vaping  Use: Never used  Substance and Sexual Activity   Alcohol use: Not Currently   Drug use: Yes    Frequency: 4.0 times per week    Types: Marijuana    Comment: uses daily   Sexual activity: Yes

## 2022-07-12 ENCOUNTER — Telehealth: Payer: Self-pay | Admitting: Orthopaedic Surgery

## 2022-07-12 NOTE — Telephone Encounter (Signed)
Pt called requesting a letter be emailed to his employer to return to work with no restrictions. Please email letter to [email or web address]. Please call pt when email to his employer. Pt phone number is [phone].

## 2022-07-12 NOTE — Telephone Encounter (Signed)
Patient states that he has been back to work. He just needs note stating that he can return with no restrictions. He went back a week after surgery.

## 2022-07-15 NOTE — Telephone Encounter (Signed)
Emailed per patient request

## 2022-07-16 ENCOUNTER — Ambulatory Visit: Payer: Commercial Managed Care - HMO | Admitting: Orthopaedic Surgery

## 2022-07-16 VITALS — BP 126/77 | HR 54 | Ht 67.0 in | Wt 154.0 lb

## 2022-07-16 DIAGNOSIS — G5602 Carpal tunnel syndrome, left upper limb: Secondary | ICD-10-CM

## 2022-07-16 NOTE — Progress Notes (Signed)
Post-Op Visit Note   Patient: David Graves           Date of Birth: [birth year]           MRN: [id number] Visit Date: 07/16/2022 PCP: Mila Palmer, MD   Assessment & Plan: Follow-up carpal tunnel release left.  He has carpal tunnel in the opposite right hand but wants to wait till after the holidays he can call when he like to schedule and we will send him up for outpatient surgery identical to what we did with the left hand.  Sutures on the left hand are harvested today.  Chief Complaint:  Chief Complaint  Patient presents with   Left Hand - Routine Post Op   Visit Diagnoses:  1. Carpal tunnel syndrome, left upper limb     Plan: Patient is doing some unsure work at [employer] work slip given that he is okay for work activity.  He will call when he wants to have the right carpal tunnel release scheduled after the holidays.  Follow-Up Instructions: No follow-ups on file.   Orders:  No orders of the defined types were placed in this encounter.  No orders of the defined types were placed in this encounter.   Imaging: No results found.  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   GERD (gastroesophageal reflux disease) 06/28/2020   Hyperlipidemia 06/28/2020   Hypertension 06/28/2020   Bilateral hand pain 06/28/2020   Rheumatoid factor positive 06/28/2020   Anxiety 12/09/2018   Past Medical History:  Diagnosis Date   Anxiety    Asthma    no inhaler   Atopic dermatitis    Bee sting    BMI 29.0-29.9,adult    Cervical radiculopathy    Chronic insomnia    Complaint of paresthesia    Elevated blood pressure reading    Epididymitis    Family history of prostate cancer    GERD (gastroesophageal reflux disease)    Gout    Heart  murmur    Hypertension    Lateral epicondylitis of right elbow    Pure hypercholesterolemia    Seasonal allergic rhinitis due to pollen     Family History  Problem Relation Age of Onset   Healthy Son    Healthy Son    Lupus Niece     Past Surgical History:  Procedure Laterality Date   ANTERIOR CERVICAL DECOMP/DISCECTOMY FUSION N/A 08/06/2021   Procedure: ANTERIOR CERVICAL DISCECTOMY FUSION C4-5, C5-6, C6-7, ALLOGRAFT, PLATE;  Surgeon: [name], MD;  Location[facility];  Service: Orthopedics;  Laterality: N/A;   CARPAL TUNNEL RELEASE Left 07/01/2022   Procedure: LEFT CARPAL TUNNEL RELEASE;  Surgeon: [name], MD;  Location: [facility];  Service: Orthopedics;  Laterality: Left;   COLONOSCOPY  10/20/2017   RADIOLOGY WITH ANESTHESIA N/A 10/17/2020   Procedure: MRI WITH ANESTHESIA  LUMBAR WITHOUT CONTRAST AND CERVICAL WITHOUT CONTRAST;  Surgeon: Radiologist, Medication, MD;  Location: [facility];  Service: Radiology;  Laterality: N/A;   UPPER GI ENDOSCOPY     VASECTOMY     Per patient   Social History   Occupational History   Not on file  Tobacco Use   Smoking status: Former    Types: Cigarettes    Quit date: 05/16/2012  Years since quitting: 10.1   Smokeless tobacco: Never  Vaping Use   Vaping Use: Never used  Substance and Sexual Activity   Alcohol use: Not Currently   Drug use: Yes    Frequency: 4.0 times per week    Types: Marijuana    Comment: uses daily   Sexual activity: Yes

## 2022-07-24 ENCOUNTER — Telehealth: Payer: Self-pay | Admitting: Orthopaedic Surgery

## 2022-07-24 NOTE — Telephone Encounter (Signed)
Please advise. OK to refill diclofenac gel?

## 2022-07-24 NOTE — Telephone Encounter (Signed)
Patient states he needs a refill on the ointment from his knee. Please advise..[phone]

## 2022-07-25 MED ORDER — DICLOFENAC SODIUM 1 % EX GEL: 4.0000 g | Freq: Three times a day (TID) | CUTANEOUS | 3 refills | Status: DC | PRN

## 2022-07-25 NOTE — Addendum Note (Signed)
Addended by: Rogers Seeds on: 07/25/2022 08:42 AM   Modules accepted: Orders

## 2022-07-25 NOTE — Telephone Encounter (Signed)
Sent to pharmacy 

## 2022-07-30 ENCOUNTER — Ambulatory Visit: Payer: Commercial Managed Care - HMO | Admitting: Orthopaedic Surgery

## 2022-07-30 ENCOUNTER — Ambulatory Visit: Payer: Self-pay

## 2022-07-30 VITALS — BP 121/70 | HR 61 | Ht 67.0 in | Wt 154.0 lb

## 2022-07-30 DIAGNOSIS — M25561 Pain in right knee: Secondary | ICD-10-CM

## 2022-07-30 MED ORDER — DICLOFENAC SODIUM 1 % EX GEL: 4.0000 g | Freq: Three times a day (TID) | CUTANEOUS | 3 refills | Status: DC | PRN

## 2022-07-30 NOTE — Progress Notes (Signed)
Post-Op Visit Note   Patient: David Graves           Date of Birth: [birth year]           MRN: [id number] Visit Date: 07/30/2022 PCP: Jonathon Jordan, MD   Assessment & Plan: Follow-up carpal tunnel release left hand.  He has had some partial separation no drainage.  He was applying Neosporin and Vaseline petroleum jelly.  We will have him just use plain lotion after washing his hand.  He is at increased problems with his right knee had great difficulty walking.  Last year he applied some Voltaren gel and points directly over the inferior pole of the patella where the patellar tendon attaches to the point where he is having pain.  Exquisite tenderness to the midportion of the patellar tendon without palpable defect.  No knee effusion collateral ligaments ACL PCL exam is normal.  I sent in some more Voltaren gel he can apply it a few times a day.  Patellar tendinitis discussed.  Follow-up as needed.  Chief Complaint:  Chief Complaint  Patient presents with   Right Knee - Pain   Visit Diagnoses:  1. Acute pain of right knee     Plan: Return PRN.   Follow-Up Instructions: No follow-ups on file.   Orders:  Orders Placed This Encounter  Procedures   XR KNEE 3 VIEW RIGHT   Meds ordered this encounter  Medications   diclofenac Sodium (VOLTAREN) 1 % GEL    Sig: Apply 4 g topically 3 (three) times daily as needed.    Dispense:  350 g    Refill:  3    Imaging: No results found.  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   GERD (gastroesophageal reflux disease) 06/28/2020   Hyperlipidemia 06/28/2020   Hypertension 06/28/2020   Bilateral hand pain 06/28/2020   Rheumatoid factor positive 06/28/2020   Anxiety 12/09/2018   Past Medical History:   Diagnosis Date   Anxiety    Asthma    no inhaler   Atopic dermatitis    Bee sting    BMI 29.0-29.9,adult    Cervical radiculopathy    Chronic insomnia    Complaint of paresthesia    Elevated blood pressure reading    Epididymitis    Family history of prostate cancer    GERD (gastroesophageal reflux disease)    Gout    Heart murmur    Hypertension    Lateral epicondylitis of right elbow    Pure hypercholesterolemia    Seasonal allergic rhinitis due to pollen     Family History  Problem Relation Age of Onset   Healthy Son    Healthy Son    Lupus Niece     Past Surgical History:  Procedure Laterality Date   ANTERIOR CERVICAL DECOMP/DISCECTOMY FUSION N/A 08/06/2021   Procedure: ANTERIOR CERVICAL DISCECTOMY FUSION C4-5, C5-6, C6-7, ALLOGRAFT, PLATE;  Surgeon: Marybelle Killings, MD;  Location: [facility];  Service: Orthopedics;  Laterality: N/A;   CARPAL TUNNEL RELEASE Left 07/01/2022   Procedure: LEFT CARPAL TUNNEL RELEASE;  Surgeon: Marybelle Killings, MD;  Location: [facility];  Service: Orthopedics;  Laterality: Left;   COLONOSCOPY  10/20/2017   RADIOLOGY WITH ANESTHESIA N/A 10/17/2020   Procedure: MRI WITH ANESTHESIA  LUMBAR  WITHOUT CONTRAST AND CERVICAL WITHOUT CONTRAST;  Surgeon: Radiologist, Medication, MD;  Location[facility];  Service: Radiology;  Laterality: N/A;   UPPER GI ENDOSCOPY     VASECTOMY     Per patient   Social History   Occupational History   Not on file  Tobacco Use   Smoking status: Former    Types: Cigarettes    Quit date: 05/16/2012    Years since quitting: 10.2   Smokeless tobacco: Never  Vaping Use   Vaping Use: Never used  Substance and Sexual Activity   Alcohol use: Not Currently   Drug use: Yes    Frequency: 4.0 times per week    Types: Marijuana    Comment: uses daily   Sexual activity: Yes

## 2023-01-12 ENCOUNTER — Encounter (HOSPITAL_BASED_OUTPATIENT_CLINIC_OR_DEPARTMENT_OTHER): Payer: Self-pay | Admitting: Emergency Medicine

## 2023-01-12 ENCOUNTER — Other Ambulatory Visit: Payer: Self-pay

## 2023-01-12 ENCOUNTER — Emergency Department (HOSPITAL_BASED_OUTPATIENT_CLINIC_OR_DEPARTMENT_OTHER): Payer: Medicaid Other

## 2023-01-12 DIAGNOSIS — X500XXA Overexertion from strenuous movement or load, initial encounter: Secondary | ICD-10-CM | POA: Diagnosis not present

## 2023-01-12 DIAGNOSIS — Y93F2 Activity, caregiving, lifting: Secondary | ICD-10-CM | POA: Insufficient documentation

## 2023-01-12 DIAGNOSIS — Z7982 Long term (current) use of aspirin: Secondary | ICD-10-CM | POA: Diagnosis not present

## 2023-01-12 DIAGNOSIS — S39012A Strain of muscle, fascia and tendon of lower back, initial encounter: Secondary | ICD-10-CM | POA: Insufficient documentation

## 2023-01-12 DIAGNOSIS — R141 Gas pain: Secondary | ICD-10-CM | POA: Diagnosis not present

## 2023-01-12 DIAGNOSIS — S3992XA Unspecified injury of lower back, initial encounter: Secondary | ICD-10-CM | POA: Diagnosis present

## 2023-01-12 DIAGNOSIS — R112 Nausea with vomiting, unspecified: Secondary | ICD-10-CM | POA: Insufficient documentation

## 2023-01-12 NOTE — ED Triage Notes (Addendum)
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

## 2023-01-13 ENCOUNTER — Emergency Department (HOSPITAL_BASED_OUTPATIENT_CLINIC_OR_DEPARTMENT_OTHER): Payer: Medicaid Other

## 2023-01-13 ENCOUNTER — Emergency Department (HOSPITAL_BASED_OUTPATIENT_CLINIC_OR_DEPARTMENT_OTHER)
Admission: EM | Admit: 2023-01-13 | Discharge: 2023-01-13 | Disposition: A | Discharge status: Home / Self Care | Payer: Medicaid Other | Attending: Emergency Medicine | Admitting: Emergency Medicine

## 2023-01-13 DIAGNOSIS — S39012A Strain of muscle, fascia and tendon of lower back, initial encounter: Secondary | ICD-10-CM

## 2023-01-13 DIAGNOSIS — R141 Gas pain: Secondary | ICD-10-CM

## 2023-01-13 DIAGNOSIS — R112 Nausea with vomiting, unspecified: Secondary | ICD-10-CM

## 2023-01-13 MED ORDER — ONDANSETRON 8 MG PO TBDP: ORAL_TABLET | ORAL | 0 refills | Status: DC

## 2023-01-13 MED ORDER — ALUM & MAG HYDROXIDE-SIMETH 200-200-20 MG/5ML PO SUSP
30.0000 mL | Freq: Once | ORAL | Status: AC
  Administered 2023-01-13: 30 mL via ORAL
  Filled 2023-01-13: qty 30

## 2023-01-13 MED ORDER — PREDNISONE 10 MG PO TABS: 20.0000 mg | ORAL_TABLET | Freq: Every day | ORAL | 0 refills | Status: DC

## 2023-01-13 MED ORDER — ONDANSETRON 4 MG PO TBDP
8.0000 mg | ORAL_TABLET | Freq: Once | ORAL | Status: AC
  Administered 2023-01-13: 8 mg via ORAL
  Filled 2023-01-13: qty 2

## 2023-01-13 MED ORDER — KETOROLAC TROMETHAMINE 60 MG/2ML IM SOLN
30.0000 mg | Freq: Once | INTRAMUSCULAR | Status: AC
  Administered 2023-01-13: 30 mg via INTRAMUSCULAR
  Filled 2023-01-13: qty 2

## 2023-01-13 MED ORDER — METHOCARBAMOL 500 MG PO TABS: 500.0000 mg | ORAL_TABLET | Freq: Two times a day (BID) | ORAL | 0 refills | Status: DC

## 2023-01-13 MED ORDER — LIDOCAINE VISCOUS HCL 2 % MT SOLN
15.0000 mL | Freq: Once | OROMUCOSAL | Status: AC
  Administered 2023-01-13: 15 mL via ORAL
  Filled 2023-01-13: qty 15

## 2023-01-13 NOTE — ED Provider Notes (Signed)
[place] [facility] Provider Note   CSN: [id number] Arrival date & time: 01/12/23  2155     History  Chief Complaint  Patient presents with   Back Pain    David Graves is a 67 y.o. male.  The history is provided by the patient.  Back Pain Location:  Sacro-iliac joint Quality:  Cramping Pain severity:  Moderate Pain is:  Same all the time Onset quality:  Sudden Duration:  2 days Timing:  Constant Progression:  Unchanged Chronicity:  New Context: not MCA and not MVA   Relieved by:  Nothing Worsened by:  Nothing Ineffective treatments:  None tried Associated symptoms: no abdominal pain and no fever   Risk factors: no hx of cancer        Home Medications Prior to Admission medications   Medication Sig Start Date End Date Taking? Authorizing Provider  methocarbamol (ROBAXIN) 500 MG tablet Take 1 tablet (500 mg total) by mouth 2 (two) times daily. 01/13/23  Yes Jedidiah Demartini, MD  ondansetron (ZOFRAN-ODT) 8 MG disintegrating tablet 8mg  ODT q8hours prn nausea 01/13/23  Yes Cornelis Kluver, MD  predniSONE (DELTASONE) 10 MG tablet Take 2 tablets (20 mg total) by mouth daily. 01/13/23  Yes Benito Lemmerman, MD  amLODipine (NORVASC) 10 MG tablet Take 10 mg by mouth daily. 03/26/20   [provider]  aspirin EC 81 MG tablet Take 81 mg by mouth daily. Swallow whole.    [provider]  atorvastatin (LIPITOR) 20 MG tablet Take 20 mg by mouth daily. 09/13/20   [provider]  cetirizine (ZYRTEC) 10 MG tablet Take 10 mg by mouth daily. 12/20/20   [provider]  diclofenac Sodium (VOLTAREN) 1 % GEL Apply 4 g topically 3 (three) times daily as needed. 07/30/22   [name], MD  fluconazole (DIFLUCAN) 150 MG tablet Take 150 mg by mouth daily. 05/23/22   [provider]  hydrOXYzine (ATARAX/VISTARIL) 25 MG tablet Take 25 mg by mouth 3 (three) times daily as needed for anxiety.    [provider]   MAGNESIUM PO Take 2 tablets by mouth daily.    [provider]  ondansetron (ZOFRAN) 4 MG tablet Take 1 tablet (4 mg total) by mouth daily as needed for nausea or vomiting. 06/05/22 06/05/23  Elson Areas, PA-C  oxyCODONE-acetaminophen (PERCOCET) 5-325 MG tablet Take 1 tablet by mouth every 6 (six) hours as needed for severe pain. Patient not taking: Reported on 07/30/2022 07/01/22 07/01/23  [name], MD  oxyCODONE-acetaminophen (PERCOCET/ROXICET) 5-325 MG tablet Take 1-2 tablets by mouth every 6 (six) hours as needed for severe pain. Patient not taking: Reported on 07/30/2022 07/09/22   [name], MD  pantoprazole (PROTONIX) 40 MG tablet Take 1 tablet (40 mg total) by mouth daily. 06/05/22   Elson Areas, PA-C  tadalafil (CIALIS) 5 MG tablet Take 5 mg by mouth daily. 06/03/22   [provider]  tamsulosin (FLOMAX) 0.4 MG CAPS capsule Take 0.4 mg by mouth daily. 08/10/21   [provider]      Allergies    Cefepime, Bee venom, Cinnamon, Codeine, Colchicine, Hydrocodone-acetaminophen, Shellfish allergy, Sulfa antibiotics, and Penicillins    Review of Systems   Review of Systems  Constitutional:  Negative for fever.  HENT:  Negative for facial swelling.   Eyes:  Negative for redness.  Respiratory:  Negative for wheezing and stridor.   Gastrointestinal:  Positive for nausea. Negative for abdominal pain.  Gassy passing large gas from bottom and burping   Musculoskeletal:  Positive for back pain.  All other systems reviewed and are negative.   Physical Exam Updated Vital Signs BP (!) 157/83   Pulse 68   Temp 97.9 F (36.6 C) (Oral)   Resp 18   Ht 5\' 7"  (1.702 m)   Wt 70.3 kg   SpO2 99%   BMI 24.28 kg/m  Physical Exam Vitals and nursing note reviewed.  Constitutional:      General: He is not in acute distress.    Appearance: Normal appearance. He is well-developed. He is not diaphoretic.  HENT:     Head: Normocephalic and atraumatic.      Nose: Nose normal.  Eyes:     Conjunctiva/sclera: Conjunctivae normal.     Pupils: Pupils are equal, round, and reactive to light.  Cardiovascular:     Rate and Rhythm: Normal rate and regular rhythm.     Pulses: Normal pulses.     Heart sounds: Normal heart sounds.  Pulmonary:     Effort: Pulmonary effort is normal.     Breath sounds: Normal breath sounds. No wheezing or rales.  Abdominal:     General: Bowel sounds are normal.     Palpations: Abdomen is soft.     Tenderness: There is no abdominal tenderness. There is no guarding or rebound.     Hernia: No hernia is present.  Musculoskeletal:        General: Normal range of motion.     Cervical back: Normal range of motion and neck supple.  Skin:    General: Skin is warm and dry.     Capillary Refill: Capillary refill takes less than 2 seconds.  Neurological:     General: No focal deficit present.     Mental Status: He is alert and oriented to person, place, and time.     Deep Tendon Reflexes: Reflexes normal.  Psychiatric:        Mood and Affect: Mood normal.        Behavior: Behavior normal.     ED Results / Procedures / Treatments   Labs (all labs ordered are listed, but only abnormal results are displayed) Labs Reviewed - No data to display  EKG None  Radiology CT Renal Stone Study  Result Date: 01/13/2023 CLINICAL DATA:  Abdominal/flank pain, stone suspected, right low back pain EXAM: CT ABDOMEN AND PELVIS WITHOUT CONTRAST TECHNIQUE: Multidetector CT imaging of the abdomen and pelvis was performed following the standard protocol without IV contrast. RADIATION DOSE REDUCTION: This exam was performed according to the departmental dose-optimization program which includes automated exposure control, adjustment of the mA and/or kV according to patient size and/or use of iterative reconstruction technique. COMPARISON:  06/05/2022 FINDINGS: Lower chest: No acute abnormality.  Small hiatal hernia Hepatobiliary: No focal  liver abnormality is seen. No gallstones, gallbladder wall thickening, or biliary dilatation. Pancreas: Unremarkable Spleen: Unremarkable Adrenals/Urinary Tract: Adrenal glands are unremarkable. Kidneys are normal, without renal calculi, focal lesion, or hydronephrosis. Bladder is unremarkable. Stomach/Bowel: Stomach is within normal limits. Appendix appears normal. No evidence of bowel wall thickening, distention, or inflammatory changes. Vascular/Lymphatic: Extensive aortoiliac atherosclerotic calcification. No aortic aneurysm. No pathologic adenopathy within the abdomen and pelvis. Reproductive: Mild prostatic hypertrophy. Other: No abdominal wall hernia Musculoskeletal: Degenerative changes seen within the lumbar spine. No acute bone abnormality. No lytic or blastic bone lesion. IMPRESSION: 1. No acute intra-abdominal pathology identified. No definite radiographic explanation for the patient's reported symptoms. 2.  Small hiatal hernia. 3. Mild prostatic hypertrophy. 4.  Aortic Atherosclerosis (ICD10-I70.0). Electronically Signed   By: Helyn Numbers M.D.   On: 01/13/2023 02:02   DG Lumbar Spine Complete  Result Date: 01/12/2023 CLINICAL DATA:  Right-sided lower back pain for 2 days radiating down the right leg. EXAM: LUMBAR SPINE - COMPLETE 4+ VIEW COMPARISON:  Lumbar spine radiographs 02/19/2022 FINDINGS: Left convex lumbar curve. No evidence of acute fracture or traumatic malalignment. Slight anterolisthesis of L3. Mild multilevel spondylosis. Moderate disc space height loss at L3-L4 and L5-S1. Mild lower lumbar facet arthropathy. IMPRESSION: 1. No evidence of acute fracture or traumatic malalignment. 2. Multilevel spondylosis as described. Electronically Signed   By: Minerva Fester M.D.   On: 01/12/2023 23:53    Procedures Procedures    Medications Ordered in ED Medications  ondansetron (ZOFRAN-ODT) disintegrating tablet 8 mg (8 mg Oral Given 01/13/23 0124)  ketorolac (TORADOL) injection 30 mg  (30 mg Intramuscular Given 01/13/23 0124)    ED Course/ Medical Decision Making/ A&P                             Medical Decision Making Patient with low back pain and then started having gas after eating collard greens   Amount and/or Complexity of Data Reviewed External Data Reviewed: notes.    Details: Previous notes reviewed  Radiology: ordered and independent interpretation performed.    Details: Negative CT and XR  Risk Prescription drug management. Risk Details: Emesis has stopped exam is benign and reassuring.  Gas is due to collard greens as he is not supposed to be eating foods that induce gas.  Medication for lumbar strain ordered.  Stable for discharge.  Strict     Final Clinical Impression(s) / ED Diagnoses Final diagnoses:  Strain of lumbar region, initial encounter  Gas pain  Nausea and vomiting, unspecified vomiting type   Return for intractable cough, coughing up blood, fevers > 100.4 unrelieved by medication, shortness of breath, intractable vomiting, chest pain, shortness of breath, weakness, numbness, changes in speech, facial asymmetry, abdominal pain, passing out, Inability to tolerate liquids or food, cough, altered mental status or any concerns. No signs of systemic illness or infection. The patient is nontoxic-appearing on exam and vital signs are within normal limits.  I have reviewed the triage vital signs and the nursing notes. Pertinent labs & imaging results that were available during my care of the patient were reviewed by me and considered in my medical decision making (see chart for details). After history, exam, and medical workup I feel the patient has been appropriately medically screened and is safe for discharge home. Pertinent diagnoses were discussed with the patient. Patient was given return precautions.  Rx / DC Orders ED Discharge Orders          Ordered    methocarbamol (ROBAXIN) 500 MG tablet  2 times daily        01/13/23 0218     predniSONE (DELTASONE) 10 MG tablet  Daily        01/13/23 0218    ondansetron (ZOFRAN-ODT) 8 MG disintegrating tablet        01/13/23 0219              Markees Carns, MD 01/13/23 0236

## 2023-01-13 NOTE — ED Notes (Signed)
Pt po challenged with own water in bottle. Pt stated that he does not like apple juice and would rather drink his own water

## 2023-01-13 NOTE — ED Notes (Signed)
ED Provider at bedside, pt began vomiting. RN notified, orders for zofran and toradol placed. Pt pacing in room, unable to stand up straight due to pain. States his pain started suddenly out of nowhere, pt is belching and reports nausea.

## 2023-01-22 ENCOUNTER — Ambulatory Visit: Payer: Medicaid Other | Admitting: Physical Medicine and Rehabilitation

## 2023-01-22 ENCOUNTER — Encounter: Payer: Self-pay | Admitting: Physical Medicine and Rehabilitation

## 2023-01-22 DIAGNOSIS — M48062 Spinal stenosis, lumbar region with neurogenic claudication: Secondary | ICD-10-CM

## 2023-01-22 DIAGNOSIS — M5116 Intervertebral disc disorders with radiculopathy, lumbar region: Secondary | ICD-10-CM

## 2023-01-22 DIAGNOSIS — M5416 Radiculopathy, lumbar region: Secondary | ICD-10-CM

## 2023-01-22 MED ORDER — TRAMADOL HCL 50 MG PO TABS: 50.0000 mg | ORAL_TABLET | Freq: Three times a day (TID) | ORAL | 0 refills | Status: DC | PRN

## 2023-01-22 NOTE — Progress Notes (Signed)
David Graves - 67 y.o. male MRN [id number]  Date of birth: 1956-05-08  Office Visit Note: Visit Date: 01/22/2023 PCP: Mila Palmer, MD Referred by: Mila Palmer, MD  Subjective: Chief Complaint  Patient presents with   Lower Back - Pain   HPI: David Graves is a 67 y.o. male who comes in today as a self referral for evaluation of chronic, worsening and severe right sided lower back pain radiating to buttock, hip and down posterolateral leg to foot. Pain ongoing for several years, increased over the last 2 weeks. Pain becomes severe with sitting. Reports trouble sleeping due to severe discomfort. He describes pain as sharp and stabbing sensation, currently rates as 8 out of 10. Some relief of pain with home exercise regimen, rest and use of medications. He was recently evaluated in the emergency department on 01/13/2023 for increased back pain, discharged home with Robaxin, Prednisone and Zofran. Minimal relief of pain with these medications. Lumbar MRI imaging from 2022 exhibits inferiorly migrated right subarticular extrusion at L5-S1 abutting the descending S1 nerve root. There is also moderate to severe spinal canal stenosis at L3-L4. He reports history of lumbar epidural steroid injections many years ago while residing in [place]. Minimal relief of pain with these procedures. He is not interested in lumbar injections at this time. History of prior ACDF of C4-C5, C5-C6, C6-C7 in 2022 by Dr. Annell Greening. He would like to return to see Dr. Ophelia Charter to discuss surgery. Patient denies focal weakness, numbness and tingling. No recent trauma or falls.   Incidentally, patient also mentioned chronic issues with right upper extremity carpal tunnel syndrome. Prior left carpal tunnel release with Dr. Ophelia Charter in 2023.     Oswestry Disability Index Score 40% 10 to 20 (40%) moderate disability: The patient experiences more pain and difficulty with sitting, lifting and standing. Travel and social life  are more difficult, and they may be disabled from work. Personal care, sexual activity and sleeping are not grossly affected, and the patient can usually be managed by conservative means.  Review of Systems  Musculoskeletal:  Positive for back pain.  Neurological:  Negative for tingling, sensory change, focal weakness and weakness.  All other systems reviewed and are negative.  Otherwise per HPI.  Assessment & Plan: Visit Diagnoses:    ICD-10-CM   1. Lumbar radiculopathy  M54.16     2. Intervertebral disc disorders with radiculopathy, lumbar region  M51.16     3. Spinal stenosis of lumbar region with neurogenic claudication  M48.062        Plan: Findings:  1. Chronic, worsening and severe right sided lower back pain radiating to buttock, hip and down posterolateral leg to foot. Patient continues to have severe pain despite good conservative therapies such as home exercise regimen, rest and use of medications. Patients clinical presentation and exam are consistent with S1 nerve pattern. We discussed treatment plan in detail today. We recommend lumbar epidural steroid injection at the level of L5-S1, would also consider injection at the level of spinal canal stenosis, there is moderate to severe spinal canal stenosis at L3-L4. Patient does not wish to proceed with injections at this time, he would like to discuss surgical options with Dr. Ophelia Charter. I did prescribe short course of Tramadol for him as he does appear to be uncomfortable today. Could also look at re-grouping with physical therapy as needed. I will work our [employer] to set up appointment with Dr. Ophelia Charter. No red flag symptoms noted upon exam today.  2. Chronic right upper extremity carpal tunnel syndrome. Patient would also like to discuss right carpal tunnel release with Dr. Ophelia Charter.   Dr. Alvester Morin participated with direct patient care including clinical review, exam when needed and significant portion of diagnostic and treatment  plan.     Meds & Orders: No orders of the defined types were placed in this encounter.  No orders of the defined types were placed in this encounter.   Follow-up: Return if symptoms worsen or fail to improve.   Procedures: No procedures performed      Clinical History: EXAM: MRI LUMBAR SPINE WITHOUT CONTRAST  TECHNIQUE: Multiplanar, multisequence MR imaging of the lumbar spine was performed. No intravenous contrast was administered.  COMPARISON: 09/27/2020 and prior.  FINDINGS: Segmentation: Standard.  Alignment: Straightening of lordosis. Trace grade 1 L3-4 anterolisthesis. Congenital spinal canal narrowing.  Vertebrae: Vertebral body heights are preserved. Modic type 2 endplate degenerative changes. No aggressive osseous lesion.  Conus medullaris and cauda equina: Conus extends to the L2 level. Conus and cauda equina appear normal.  Disc levels: Multilevel desiccation.  L1-2: No significant disc bulge. Prominent ligamentum flavum and facet degenerative spurring. Patent spinal canal and neural foramen.  L2-3: Disc bulge with left foraminal/extraforaminal protrusion with annular fissuring. Facet degenerative spurring. Mild spinal canal and bilateral neural foraminal narrowing.  L3-4: Disc bulge with superimposed bilateral foraminal protrusions. Prominent ligamentum flavum and bilateral facet hypertrophy. Moderate to severe spinal canal narrowing. Severe right and mild left neural foraminal narrowing.  L4-5: Disc bulge, ligamentum flavum thickening and bilateral facet hypertrophy. Mild spinal canal and bilateral neural foraminal narrowing.  L5-S1: Disc bulge with inferiorly migrated right subarticular extrusion abutting the descending S1 nerve root. Bilateral facet hypertrophy. Mild spinal canal narrowing. Moderate right and mild left neural foraminal narrowing.  Paraspinal and other soft tissues: Negative.  IMPRESSION: 1. Multilevel spondylosis. Moderate  to severe spinal canal and right neural foraminal narrowing at the L3-4 level. 2. Moderate right L5-S1 neural foraminal narrowing. 3. Otherwise mild spinal canal/neural foraminal narrowing at the L2-S1 levels.   Electronically Signed By: Stana Bunting M.D. On: 10/17/2020 15:23   He reports that he quit smoking about 10 years ago. His smoking use included cigarettes. He has never used smokeless tobacco. No results for input(s): "HGBA1C", "LABURIC" in the last 8760 hours.  Objective:  VS:  HT:    WT:   BMI:     BP:   HR: bpm  TEMP: ( )  RESP:  Physical Exam Vitals and nursing note reviewed.  HENT:     Head: Normocephalic and atraumatic.     Right Ear: External ear normal.     Left Ear: External ear normal.     Nose: Nose normal.     Mouth/Throat:     Mouth: Mucous membranes are moist.  Eyes:     Extraocular Movements: Extraocular movements intact.  Cardiovascular:     Rate and Rhythm: Normal rate.     Pulses: Normal pulses.  Pulmonary:     Effort: Pulmonary effort is normal.  Abdominal:     General: Abdomen is flat. There is no distension.  Musculoskeletal:        General: Tenderness present.     Cervical back: Normal range of motion.     Comments: Patient rises from seated position to standing without difficulty. Good lumbar range of motion. No pain noted with facet loading. 5/5 strength noted with bilateral hip flexion, knee flexion/extension, ankle dorsiflexion/plantarflexion and EHL. No clonus noted bilaterally. No  pain upon palpation of greater trochanters. No pain with internal/external rotation of bilateral hips. Sensation intact bilaterally. Dysesthesias noted to right S1 dermatome. Negative slump test bilaterally. Ambulates without aid, gait steady.     Skin:    General: Skin is warm and dry.     Capillary Refill: Capillary refill takes less than 2 seconds.  Neurological:     General: No focal deficit present.     Mental Status: He is alert and oriented to  person, place, and time.  Psychiatric:        Mood and Affect: Mood normal.        Behavior: Behavior normal.     Ortho Exam  Imaging: No results found.  Past Medical/Family/Surgical/Social History: Medications & Allergies reviewed per EMR, new medications updated. 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   GERD (gastroesophageal reflux disease) 06/28/2020   Hyperlipidemia 06/28/2020   Hypertension 06/28/2020   Bilateral hand pain 06/28/2020   Rheumatoid factor positive 06/28/2020   Anxiety 12/09/2018   Past Medical History:  Diagnosis Date   Anxiety    Asthma    no inhaler   Atopic dermatitis    Bee sting    BMI 29.0-29.9,adult    Cervical radiculopathy    Chronic insomnia    Complaint of paresthesia    Elevated blood pressure reading    Epididymitis    Family history of prostate cancer    GERD (gastroesophageal reflux disease)    Gout    Heart murmur    Hypertension    Lateral epicondylitis of right elbow    Pure hypercholesterolemia    Seasonal allergic rhinitis due to pollen    Family History  Problem Relation Age of Onset   Healthy Son    Healthy Son    Lupus Niece    Past Surgical History:  Procedure Laterality Date   ANTERIOR CERVICAL DECOMP/DISCECTOMY FUSION N/A 08/06/2021   Procedure: ANTERIOR CERVICAL DISCECTOMY FUSION C4-5, C5-6, C6-7, ALLOGRAFT, PLATE;  Surgeon: [name], MD;  Location: [facility] OR;  Service: Orthopedics;  Laterality: N/A;   CARPAL TUNNEL RELEASE Left 07/01/2022   Procedure: LEFT CARPAL TUNNEL RELEASE;  Surgeon: [name], MD;  Location: [facility];  Service: Orthopedics;  Laterality: Left;   COLONOSCOPY  10/20/2017   RADIOLOGY WITH ANESTHESIA N/A 10/17/2020   Procedure: MRI WITH ANESTHESIA   LUMBAR WITHOUT CONTRAST AND CERVICAL WITHOUT CONTRAST;  Surgeon: Radiologist, Medication, MD;  Location: [facility] OR;  Service: Radiology;  Laterality: N/A;   UPPER GI ENDOSCOPY     VASECTOMY     Per patient   Social History   Occupational History   Not on file  Tobacco Use   Smoking status: Former    Types: Cigarettes    Quit date: 05/16/2012    Years since quitting: 10.6   Smokeless tobacco: Never  Vaping Use   Vaping Use: Never used  Substance and Sexual Activity   Alcohol use: Not Currently   Drug use: Yes    Frequency: 4.0 times per week    Types: Marijuana    Comment: uses daily   Sexual activity: Yes

## 2023-01-22 NOTE — Progress Notes (Signed)
   01/22/23 1008  Fall Screening  Falls in the past year? 0  Number of falls in past year 0  Was there an injury with Fall? 0  Fall Risk Category Calculator 0  Fall Risk  Patient at Risk for Falls Due to No Fall Risks  Fall risk Follow up Falls evaluation completed;Education provided    

## 2023-01-22 NOTE — Progress Notes (Signed)
Functional Pain Scale - descriptive words and definitions  Unmanageable (7)  Pain interferes with normal ADL's/nothing seems to help/sleep is very difficult/active distractions are very difficult to concentrate on. Severe range order  Average Pain 8  Lower back pain on right side that radiates into right hip and leg. Nothing seems to help

## 2023-01-29 ENCOUNTER — Telehealth: Payer: Self-pay | Admitting: Physical Medicine and Rehabilitation

## 2023-01-29 NOTE — Telephone Encounter (Signed)
Patient states he is in severe pain and is out of Tramadol and the pharmacy told him to call the office to get a refill.  Pt request a call back ASAP.

## 2023-01-31 ENCOUNTER — Telehealth: Payer: Self-pay | Admitting: Orthopaedic Surgery

## 2023-01-31 NOTE — Telephone Encounter (Signed)
Dr Ophelia Charter is out of the office today through next week. Are you able to advise.

## 2023-01-31 NOTE — Telephone Encounter (Signed)
Patient called asked if Dr. Ophelia Charter think it would benefit him to have the injections with Dr. Alvester Morin for his lower back?   The number to contact patient is [phone]

## 2023-02-03 NOTE — Telephone Encounter (Signed)
I called and talked to the pt. He is still unsure if he wants another inj. [place] Spaniel he will call us back

## 2023-02-19 DIAGNOSIS — K2950 Unspecified chronic gastritis without bleeding: Secondary | ICD-10-CM | POA: Diagnosis not present

## 2023-02-19 DIAGNOSIS — Z713 Dietary counseling and surveillance: Secondary | ICD-10-CM | POA: Diagnosis not present

## 2023-02-20 DIAGNOSIS — M545 Low back pain, unspecified: Secondary | ICD-10-CM | POA: Diagnosis not present

## 2023-02-20 DIAGNOSIS — R140 Abdominal distension (gaseous): Secondary | ICD-10-CM | POA: Diagnosis not present

## 2023-02-21 ENCOUNTER — Telehealth: Payer: Self-pay | Admitting: Physical Medicine and Rehabilitation

## 2023-02-21 NOTE — Telephone Encounter (Signed)
Patient called needing to schedule and appointment with Dr. Alvester Morin for his back     Pt# [phone]

## 2023-02-25 ENCOUNTER — Telehealth: Payer: Self-pay | Admitting: Radiology

## 2023-02-25 ENCOUNTER — Ambulatory Visit (INDEPENDENT_AMBULATORY_CARE_PROVIDER_SITE_OTHER): Payer: Medicare Other | Admitting: Orthopaedic Surgery

## 2023-02-25 ENCOUNTER — Encounter: Payer: Self-pay | Admitting: Orthopaedic Surgery

## 2023-02-25 VITALS — BP 154/82 | HR 63

## 2023-02-25 DIAGNOSIS — M48062 Spinal stenosis, lumbar region with neurogenic claudication: Secondary | ICD-10-CM

## 2023-02-25 DIAGNOSIS — G5601 Carpal tunnel syndrome, right upper limb: Secondary | ICD-10-CM | POA: Diagnosis not present

## 2023-02-25 DIAGNOSIS — M48061 Spinal stenosis, lumbar region without neurogenic claudication: Secondary | ICD-10-CM | POA: Insufficient documentation

## 2023-02-25 MED ORDER — TRAMADOL HCL 50 MG PO TABS: 50.0000 mg | ORAL_TABLET | Freq: Two times a day (BID) | ORAL | 0 refills | Status: DC | PRN

## 2023-02-25 NOTE — Telephone Encounter (Signed)
Noted. Will have patient discuss with Dr. Ophelia Charter.

## 2023-02-25 NOTE — Progress Notes (Signed)
Office Visit Note   Patient: David Graves           Date of Birth: 1956-03-30           MRN: [id number] Visit Date: 02/25/2023              Requested by: Mila Palmer, MD [address] [place],  [place] [address] PCP: Mila Palmer, MD   Assessment & Plan: Visit Diagnoses:  1. Carpal tunnel syndrome, right upper limb     Plan: Patient wants to talk with Cordelia Pen about scheduling right carpal tunnel release, day surgery local standby he previously had sedation plus local.  Will schedule for lumbar MRI with general anesthesia.  I discussed with him that there may be a delay in this since this would require CRNA and there is some CRNA scheduling problems currently.  He can return to see me after MRI scan and then we can discuss lumbar decompression surgery as we have done in the past.  Follow-Up Instructions: No follow-ups on file.   Orders:  No orders of the defined types were placed in this encounter.  No orders of the defined types were placed in this encounter.     Procedures: No procedures performed   Clinical Data: No additional findings.   Subjective: Chief Complaint  Patient presents with   Lower Back - Pain    HPI 67 year old male returns.  He states his back pain has gotten significantly worse.  He has had significant stenosis at L3-4 and previous lumbar decompression surgery has been discussed.  He reminds me that he is still claustrophobic that he always has to undergo general anesthesia since he cannot tolerate it even with Valium in an open scanner.  Lumbar MRI scan 2022 ordered by Dr. Ferdie Ping showed severe spinal stenosis of the right neural foramina and moderate to severe central stenosis and moderate L5-S1 neuroforaminal narrowing.  In addition has had carpal tunnel syndrome positive electrical studies and is put off having the surgery done and states now his right hand is giving him severe problems and wants to proceed with right carpal tunnel  release.  His left carpal tunnel release was done by me in November and is doing well.  He is also had anterior cervical discectomy and fusion 3 level of the C4-C7 doing well.  Patient states he wants to proceed with a new MRI and wants to schedule lumbar decompression surgery and also wants to proceed with right carpal tunnel release.  Patient's been out of work.  He is having problems standing only stand for a few minutes has to sit after sitting for for 5 minutes he has to get back up and move around.  He is having trouble sleeping.  Unable to walk more than short distance.  He gets slight relief leaning on a grocery cart but this only lasts for 15 to 20 seconds.  During the exam today moves from chair to standing leaning on the counter back to the chair and then back standing again multiple times.  Review of Systems all systems noncontributory to HPI.   Objective: Vital Signs: BP (!) 154/82   Pulse 63   Physical Exam Constitutional:      Appearance: He is well-developed.  HENT:     Head: Normocephalic and atraumatic.     Right Ear: External ear normal.     Left Ear: External ear normal.  Eyes:     Pupils: Pupils are equal, round, and reactive to light.  Neck:     Thyroid: No thyromegaly.     Trachea: No tracheal deviation.  Cardiovascular:     Rate and Rhythm: Normal rate.  Pulmonary:     Effort: Pulmonary effort is normal.     Breath sounds: No wheezing.  Abdominal:     General: Bowel sounds are normal.     Palpations: Abdomen is soft.  Musculoskeletal:     Cervical back: Neck supple.  Skin:    General: Skin is warm and dry.     Capillary Refill: Capillary refill takes less than 2 seconds.  Neurological:     Mental Status: He is alert and oriented to person, place, and time.  Psychiatric:        Behavior: Behavior normal.        Thought Content: Thought content normal.        Judgment: Judgment normal.     Ortho Exam well-healed left carpal tunnel incision.  Right  positive Phalen's positive carpal compression test.  Knee and ankle jerk are intact negative logroll hips.  Distal pulses are intact.  Specialty Comments:  EXAM: MRI LUMBAR SPINE WITHOUT CONTRAST  TECHNIQUE: Multiplanar, multisequence MR imaging of the lumbar spine was performed. No intravenous contrast was administered.  COMPARISON: 09/27/2020 and prior.  FINDINGS: Segmentation: Standard.  Alignment: Straightening of lordosis. Trace grade 1 L3-4 anterolisthesis. Congenital spinal canal narrowing.  Vertebrae: Vertebral body heights are preserved. Modic type 2 endplate degenerative changes. No aggressive osseous lesion.  Conus medullaris and cauda equina: Conus extends to the L2 level. Conus and cauda equina appear normal.  Disc levels: Multilevel desiccation.  L1-2: No significant disc bulge. Prominent ligamentum flavum and facet degenerative spurring. Patent spinal canal and neural foramen.  L2-3: Disc bulge with left foraminal/extraforaminal protrusion with annular fissuring. Facet degenerative spurring. Mild spinal canal and bilateral neural foraminal narrowing.  L3-4: Disc bulge with superimposed bilateral foraminal protrusions. Prominent ligamentum flavum and bilateral facet hypertrophy. Moderate to severe spinal canal narrowing. Severe right and mild left neural foraminal narrowing.  L4-5: Disc bulge, ligamentum flavum thickening and bilateral facet hypertrophy. Mild spinal canal and bilateral neural foraminal narrowing.  L5-S1: Disc bulge with inferiorly migrated right subarticular extrusion abutting the descending S1 nerve root. Bilateral facet hypertrophy. Mild spinal canal narrowing. Moderate right and mild left neural foraminal narrowing.  Paraspinal and other soft tissues: Negative.  IMPRESSION: 1. Multilevel spondylosis. Moderate to severe spinal canal and right neural foraminal narrowing at the L3-4 level. 2. Moderate right L5-S1 neural foraminal  narrowing. 3. Otherwise mild spinal canal/neural foraminal narrowing at the L2-S1 levels.   Electronically Signed By: Stana Bunting M.D. On: 10/17/2020 15:23  Imaging: No results found.   PMFS History: Patient Active Problem List   Diagnosis Date Noted   Carpal tunnel syndrome, right upper limb 02/25/2023   Carpal tunnel syndrome, left upper limb 05/03/2022   S/P cervical spinal fusion 08/14/2021   Other spondylosis with radiculopathy, cervical region 07/02/2021   Foraminal stenosis of cervical region 07/02/2021   GERD (gastroesophageal reflux disease) 06/28/2020   Hyperlipidemia 06/28/2020   Hypertension 06/28/2020   Bilateral hand pain 06/28/2020   Rheumatoid factor positive 06/28/2020   Anxiety 12/09/2018   Past Medical History:  Diagnosis Date   Anxiety    Asthma    no inhaler   Atopic dermatitis    Bee sting    BMI 29.0-29.9,adult    Cervical radiculopathy    Chronic insomnia    Complaint of paresthesia    Elevated blood pressure  reading    Epididymitis    Family history of prostate cancer    GERD (gastroesophageal reflux disease)    Gout    Heart murmur    Hypertension    Lateral epicondylitis of right elbow    Pure hypercholesterolemia    Seasonal allergic rhinitis due to pollen     Family History  Problem Relation Age of Onset   Healthy Son    Healthy Son    Lupus Niece     Past Surgical History:  Procedure Laterality Date   ANTERIOR CERVICAL DECOMP/DISCECTOMY FUSION N/A 08/06/2021   Procedure: ANTERIOR CERVICAL DISCECTOMY FUSION C4-5, C5-6, C6-7, ALLOGRAFT, PLATE;  Surgeon: [name], MD;  Location: [facility] OR;  Service: Orthopedics;  Laterality: N/A;   CARPAL TUNNEL RELEASE Left 07/01/2022   Procedure: LEFT CARPAL TUNNEL RELEASE;  Surgeon: [name], MD;  Location: [facility];  Service: Orthopedics;  Laterality: Left;   COLONOSCOPY  10/20/2017   RADIOLOGY WITH ANESTHESIA N/A 10/17/2020   Procedure: MRI WITH ANESTHESIA   LUMBAR WITHOUT CONTRAST AND CERVICAL WITHOUT CONTRAST;  Surgeon: Radiologist, Medication, MD;  Location: [facility] OR;  Service: Radiology;  Laterality: N/A;   UPPER GI ENDOSCOPY     VASECTOMY     Per patient   Social History   Occupational History   Not on file  Tobacco Use   Smoking status: Former    Types: Cigarettes    Quit date: 05/16/2012    Years since quitting: 10.7   Smokeless tobacco: Never  Vaping Use   Vaping Use: Never used  Substance and Sexual Activity   Alcohol use: Not Currently   Drug use: Yes    Frequency: 4.0 times per week    Types: Marijuana    Comment: uses daily   Sexual activity: Yes

## 2023-02-25 NOTE — Telephone Encounter (Signed)
Patient is seeing Dr. Ophelia Charter today and he called in requesting a back injection.

## 2023-02-25 NOTE — Telephone Encounter (Signed)
Received prior auth request for Tramadol. Submitted to Cover My Meds.   David Graves (Key: [facility]) PA Case ID #: [id number] Rx #: [id number] Need Help? Call us at [phone] Status sent iconSent to Plan today Drug traMADol HCl 50MG  tablets ePA cloud logo Form [employer] Medicare Part D Electronic Prior Authorization Form [phone] NCPDP) Original Claim Info [id number]

## 2023-02-28 ENCOUNTER — Telehealth: Payer: Self-pay | Admitting: Radiology

## 2023-02-28 NOTE — Telephone Encounter (Signed)
Received secure chat message that MRI with anesthesia is being booked out until December or January. I called patient and advised. Also asked if he would like to try valium and have MRI. He states that he will have to wait and is unable to do it with Valium.

## 2023-03-04 ENCOUNTER — Telehealth: Payer: Self-pay | Admitting: Radiology

## 2023-03-04 ENCOUNTER — Telehealth: Payer: Self-pay | Admitting: Orthopaedic Surgery

## 2023-03-04 NOTE — Telephone Encounter (Signed)
Will you see if you can get him in somewhere for scan?

## 2023-03-04 NOTE — Telephone Encounter (Signed)
Pt is stating he is in increasingly terrible pain and he cannot wait 6 months for a MRI and he is willing to go to any facility got it, stated the pain is starting to effect his mental health because he has been stuck in bed for the past 2 months please advise

## 2023-03-04 NOTE — Telephone Encounter (Signed)
I called patient and explained the wait for appt for sedation MRI is due to shortage of CRNA.  I toldhim per April P there is an appt on 06/26/23 for sedation MRI.  The schedulers will be calling him to schedule that.    He says the tramadol is not helping, he is in severe pain and needs something more for his pain.  I am going to call around to see if another hospital system can do a sedation MRI any sooner.  He is asking can you please send in a Rx for pain for him?  Other than the tramadol?

## 2023-03-04 NOTE — Telephone Encounter (Signed)
I spoke to patient, he is scheduled at [facility] for sedation MRI on 06/26/23.  I will look at outside hospital systems for a sooner appt.  Patient aware.

## 2023-03-05 DIAGNOSIS — G5603 Carpal tunnel syndrome, bilateral upper limbs: Secondary | ICD-10-CM | POA: Diagnosis not present

## 2023-03-05 DIAGNOSIS — Z1159 Encounter for screening for other viral diseases: Secondary | ICD-10-CM | POA: Diagnosis not present

## 2023-03-05 DIAGNOSIS — I10 Essential (primary) hypertension: Secondary | ICD-10-CM | POA: Diagnosis not present

## 2023-03-05 DIAGNOSIS — I119 Hypertensive heart disease without heart failure: Secondary | ICD-10-CM | POA: Diagnosis not present

## 2023-03-05 DIAGNOSIS — G9520 Unspecified cord compression: Secondary | ICD-10-CM | POA: Diagnosis not present

## 2023-03-05 DIAGNOSIS — Z0000 Encounter for general adult medical examination without abnormal findings: Secondary | ICD-10-CM | POA: Diagnosis not present

## 2023-03-05 DIAGNOSIS — R946 Abnormal results of thyroid function studies: Secondary | ICD-10-CM | POA: Diagnosis not present

## 2023-03-05 DIAGNOSIS — Z136 Encounter for screening for cardiovascular disorders: Secondary | ICD-10-CM | POA: Diagnosis not present

## 2023-03-05 DIAGNOSIS — E7800 Pure hypercholesterolemia, unspecified: Secondary | ICD-10-CM | POA: Diagnosis not present

## 2023-03-05 DIAGNOSIS — Z79899 Other long term (current) drug therapy: Secondary | ICD-10-CM | POA: Diagnosis not present

## 2023-03-06 ENCOUNTER — Other Ambulatory Visit: Payer: Self-pay | Admitting: Family Medicine

## 2023-03-06 ENCOUNTER — Telehealth: Payer: Self-pay | Admitting: Orthopaedic Surgery

## 2023-03-06 DIAGNOSIS — Z136 Encounter for screening for cardiovascular disorders: Secondary | ICD-10-CM

## 2023-03-06 NOTE — Telephone Encounter (Signed)
Pt called requesting Dr Ophelia Charter to send a referral to [facility] phone number [phone]. Please call pt when referral has been sent to [phone].

## 2023-03-07 ENCOUNTER — Other Ambulatory Visit: Payer: Self-pay | Admitting: Physician Assistant

## 2023-03-07 ENCOUNTER — Encounter (HOSPITAL_BASED_OUTPATIENT_CLINIC_OR_DEPARTMENT_OTHER): Payer: Self-pay | Admitting: Orthopaedic Surgery

## 2023-03-07 ENCOUNTER — Other Ambulatory Visit: Payer: Self-pay

## 2023-03-10 ENCOUNTER — Other Ambulatory Visit: Payer: Self-pay | Admitting: Orthopaedic Surgery

## 2023-03-10 ENCOUNTER — Ambulatory Visit
Admission: RE | Admit: 2023-03-10 | Discharge: 2023-03-10 | Disposition: A | Discharge status: Home / Self Care | Payer: Medicare Other | Source: Ambulatory Visit | Attending: Family Medicine | Admitting: Family Medicine

## 2023-03-10 ENCOUNTER — Telehealth: Payer: Self-pay | Admitting: Orthopaedic Surgery

## 2023-03-10 DIAGNOSIS — Z87891 Personal history of nicotine dependence: Secondary | ICD-10-CM | POA: Diagnosis not present

## 2023-03-10 DIAGNOSIS — Z136 Encounter for screening for cardiovascular disorders: Secondary | ICD-10-CM | POA: Diagnosis not present

## 2023-03-10 NOTE — Telephone Encounter (Signed)
Patient called advised Tramadol is not working to help with the pain. Patient said his PCP referred him back to Dr. Ophelia Charter for pain medication.  The number to contact patient is [phone]

## 2023-03-10 NOTE — Telephone Encounter (Signed)
I called, unable to leave message. Will try again.

## 2023-03-11 ENCOUNTER — Other Ambulatory Visit: Payer: Self-pay | Admitting: Orthopaedic Surgery

## 2023-03-11 MED ORDER — OXYCODONE-ACETAMINOPHEN 5-325 MG PO TABS: 1.0000 | ORAL_TABLET | ORAL | 0 refills | Status: DC | PRN

## 2023-03-11 NOTE — Telephone Encounter (Signed)
I contacted pt and advised him that the anes. [place] has been contacted and they can not do the MRI without getting the go ahead from [facility] since they do it for [facility] as well and it will have to be placed as a STAT. Pt voiced understanding. Told him I will try to contact [facility] and see if can get expedited for a sooner appt

## 2023-03-11 NOTE — Telephone Encounter (Signed)
I have spoken with patient at length in regards to MRI under anesthesia and shortage of CRNA's. He would like to know if his insurance would cover this being done at  [facility], [place], [place], etc  where he may be able to get scan prior to December. Could you help with this?

## 2023-03-11 NOTE — Telephone Encounter (Signed)
I spoke with patient. He has picked up medication. I advised to be used sparingly.

## 2023-03-11 NOTE — Telephone Encounter (Signed)
I called, voicemail not set up. Unable to reach patient to discuss.

## 2023-03-11 NOTE — Telephone Encounter (Signed)
I called to advise. Voicemail has not been set up yet.

## 2023-03-15 ENCOUNTER — Encounter (HOSPITAL_BASED_OUTPATIENT_CLINIC_OR_DEPARTMENT_OTHER): Payer: Self-pay | Admitting: Orthopaedic Surgery

## 2023-03-15 NOTE — Anesthesia Preprocedure Evaluation (Addendum)
Anesthesia Evaluation  Patient identified by MRN, date of birth, ID band Patient awake    Reviewed: Allergy & Precautions, NPO status , Patient's Chart, lab work & pertinent test results  Airway Mallampati: II  TM Distance: >3 FB Neck ROM: Full    Dental  (+) Teeth Intact   Pulmonary asthma , former smoker   Pulmonary exam normal breath sounds clear to auscultation       Cardiovascular hypertension, Pt. on medications Normal cardiovascular exam+ Valvular Problems/Murmurs  Rhythm:Regular Rate:Normal  Echo 04/05/20 1. Left ventricular ejection fraction, by estimation, is 60 to 65%. The  left ventricle has normal function. The left ventricle has no regional  wall motion abnormalities. There is mild left ventricular hypertrophy.  Left ventricular diastolic parameters  are indeterminate.   2. Right ventricular systolic function is normal. The right ventricular  size is normal. There is normal pulmonary artery systolic pressure.   3. The mitral valve is normal in structure. Trivial mitral valve  regurgitation. No evidence of mitral stenosis.   4. The aortic valve is normal in structure. Aortic valve regurgitation is  not visualized. No aortic stenosis is present.   5. The inferior vena cava is normal in size with greater than 50%  respiratory variability, suggesting right atrial pressure of 3 mmHg.   EKG 06/05/22 NSR, biatrial enlargement, repol abn unchanged from prev tracing     Neuro/Psych   Anxiety     Right CTS  Neuromuscular disease    GI/Hepatic Neg liver ROS,GERD  Medicated,,  Endo/Other  Hyperlipidemia Gout  Renal/GU negative Renal ROS  negative genitourinary   Musculoskeletal  (+) Arthritis , Osteoarthritis,  Hx/o cervical stenosis S/P fusion   Abdominal   Peds  Hematology negative hematology ROS (+)   Anesthesia Other Findings   Reproductive/Obstetrics ED                               Anesthesia Physical Anesthesia Plan  ASA: 2  Anesthesia Plan: MAC   Post-op Pain Management: Minimal or no pain anticipated   Induction: Intravenous  PONV Risk Score and Plan: 2 and Propofol infusion and Treatment may vary due to age or medical condition  Airway Management Planned: Natural Airway and Simple Face Mask  Additional Equipment: None  Intra-op Plan:   Post-operative Plan:   Informed Consent: I have reviewed the patients History and Physical, chart, labs and discussed the procedure including the risks, benefits and alternatives for the proposed anesthesia with the patient or authorized representative who has indicated his/her understanding and acceptance.     Dental advisory given  Plan Discussed with: CRNA and Anesthesiologist  Anesthesia Plan Comments:         Anesthesia Quick Evaluation

## 2023-03-17 ENCOUNTER — Encounter (HOSPITAL_BASED_OUTPATIENT_CLINIC_OR_DEPARTMENT_OTHER): Payer: Self-pay | Admitting: Orthopaedic Surgery

## 2023-03-17 ENCOUNTER — Ambulatory Visit (HOSPITAL_BASED_OUTPATIENT_CLINIC_OR_DEPARTMENT_OTHER): Payer: Medicare Other | Admitting: Anesthesiology

## 2023-03-17 ENCOUNTER — Ambulatory Visit (HOSPITAL_BASED_OUTPATIENT_CLINIC_OR_DEPARTMENT_OTHER)
Admission: RE | Admit: 2023-03-17 | Discharge: 2023-03-17 | Disposition: A | Discharge status: Home / Self Care | Payer: Medicare Other | Attending: Orthopaedic Surgery | Admitting: Orthopaedic Surgery

## 2023-03-17 ENCOUNTER — Encounter (HOSPITAL_BASED_OUTPATIENT_CLINIC_OR_DEPARTMENT_OTHER)
Admission: RE | Disposition: A | Discharge status: Home / Self Care | Payer: Self-pay | Source: Home / Self Care | Attending: Orthopaedic Surgery

## 2023-03-17 DIAGNOSIS — M48061 Spinal stenosis, lumbar region without neurogenic claudication: Secondary | ICD-10-CM | POA: Diagnosis not present

## 2023-03-17 DIAGNOSIS — G5601 Carpal tunnel syndrome, right upper limb: Secondary | ICD-10-CM | POA: Diagnosis present

## 2023-03-17 DIAGNOSIS — Z981 Arthrodesis status: Secondary | ICD-10-CM | POA: Diagnosis not present

## 2023-03-17 DIAGNOSIS — Z09 Encounter for follow-up examination after completed treatment for conditions other than malignant neoplasm: Secondary | ICD-10-CM | POA: Diagnosis not present

## 2023-03-17 DIAGNOSIS — Z79899 Other long term (current) drug therapy: Secondary | ICD-10-CM | POA: Diagnosis not present

## 2023-03-17 DIAGNOSIS — K219 Gastro-esophageal reflux disease without esophagitis: Secondary | ICD-10-CM | POA: Diagnosis not present

## 2023-03-17 DIAGNOSIS — I10 Essential (primary) hypertension: Secondary | ICD-10-CM | POA: Diagnosis not present

## 2023-03-17 DIAGNOSIS — M1990 Unspecified osteoarthritis, unspecified site: Secondary | ICD-10-CM | POA: Diagnosis not present

## 2023-03-17 DIAGNOSIS — Z87891 Personal history of nicotine dependence: Secondary | ICD-10-CM | POA: Diagnosis not present

## 2023-03-17 DIAGNOSIS — R262 Difficulty in walking, not elsewhere classified: Secondary | ICD-10-CM | POA: Insufficient documentation

## 2023-03-17 DIAGNOSIS — J45909 Unspecified asthma, uncomplicated: Secondary | ICD-10-CM | POA: Diagnosis not present

## 2023-03-17 HISTORY — PX: CARPAL TUNNEL RELEASE: SHX101

## 2023-03-17 SURGERY — CARPAL TUNNEL RELEASE
Anesthesia: Monitor Anesthesia Care | Site: Wrist | Laterality: Right

## 2023-03-17 MED ORDER — LEVOFLOXACIN IN D5W 500 MG/100ML IV SOLN: 500.0000 mg | INTRAVENOUS | Status: DC

## 2023-03-17 MED ORDER — ONDANSETRON HCL 4 MG/2ML IJ SOLN
INTRAMUSCULAR | Status: DC | PRN
  Administered 2023-03-17: 4 mg via INTRAVENOUS

## 2023-03-17 MED ORDER — OXYCODONE HCL 5 MG/5ML PO SOLN: 5.0000 mg | Freq: Once | ORAL | Status: DC | PRN

## 2023-03-17 MED ORDER — OXYCODONE-ACETAMINOPHEN 5-325 MG PO TABS: 1.0000 | ORAL_TABLET | Freq: Four times a day (QID) | ORAL | 0 refills | Status: DC | PRN

## 2023-03-17 MED ORDER — BUPIVACAINE HCL (PF) 0.25 % IJ SOLN
INTRAMUSCULAR | Status: DC | PRN
  Administered 2023-03-17: 5 mL

## 2023-03-17 MED ORDER — ONDANSETRON HCL 4 MG/2ML IJ SOLN: 4.0000 mg | Freq: Once | INTRAMUSCULAR | Status: DC | PRN

## 2023-03-17 MED ORDER — PROPOFOL 10 MG/ML IV BOLUS
INTRAVENOUS | Status: AC
  Filled 2023-03-17: qty 20

## 2023-03-17 MED ORDER — LACTATED RINGERS IV SOLN: INTRAVENOUS | Status: DC

## 2023-03-17 MED ORDER — PROPOFOL 500 MG/50ML IV EMUL
INTRAVENOUS | Status: DC | PRN
  Administered 2023-03-17: 50 ug/kg/min via INTRAVENOUS

## 2023-03-17 MED ORDER — LEVOFLOXACIN IN D5W 500 MG/100ML IV SOLN
INTRAVENOUS | Status: AC
  Filled 2023-03-17: qty 100

## 2023-03-17 MED ORDER — MIDAZOLAM HCL 2 MG/2ML IJ SOLN
INTRAMUSCULAR | Status: AC
  Filled 2023-03-17: qty 2

## 2023-03-17 MED ORDER — OXYCODONE HCL 5 MG PO TABS: 5.0000 mg | ORAL_TABLET | Freq: Once | ORAL | Status: DC | PRN

## 2023-03-17 MED ORDER — FENTANYL CITRATE (PF) 100 MCG/2ML IJ SOLN
INTRAMUSCULAR | Status: AC
  Filled 2023-03-17: qty 2

## 2023-03-17 MED ORDER — VANCOMYCIN HCL IN DEXTROSE 1-5 GM/200ML-% IV SOLN: 1000.0000 mg | INTRAVENOUS | Status: DC

## 2023-03-17 MED ORDER — BUPIVACAINE HCL (PF) 0.25 % IJ SOLN
INTRAMUSCULAR | Status: AC
  Filled 2023-03-17: qty 30

## 2023-03-17 MED ORDER — LIDOCAINE 2% (20 MG/ML) 5 ML SYRINGE
INTRAMUSCULAR | Status: DC | PRN
  Administered 2023-03-17: 40 mg via INTRAVENOUS

## 2023-03-17 MED ORDER — FENTANYL CITRATE (PF) 100 MCG/2ML IJ SOLN
INTRAMUSCULAR | Status: DC | PRN
  Administered 2023-03-17 (×2): 50 ug via INTRAVENOUS

## 2023-03-17 MED ORDER — LIDOCAINE 2% (20 MG/ML) 5 ML SYRINGE
INTRAMUSCULAR | Status: AC
  Filled 2023-03-17: qty 5

## 2023-03-17 MED ORDER — FENTANYL CITRATE (PF) 100 MCG/2ML IJ SOLN: 25.0000 ug | INTRAMUSCULAR | Status: DC | PRN

## 2023-03-17 MED ORDER — VANCOMYCIN HCL IN DEXTROSE 1-5 GM/200ML-% IV SOLN
INTRAVENOUS | Status: AC
  Filled 2023-03-17: qty 200

## 2023-03-17 MED ORDER — MIDAZOLAM HCL 5 MG/5ML IJ SOLN
INTRAMUSCULAR | Status: DC | PRN
  Administered 2023-03-17: 2 mg via INTRAVENOUS

## 2023-03-17 SURGICAL SUPPLY — 40 items
APL SKNCLS STERI-STRIP NONHPOA (GAUZE/BANDAGES/DRESSINGS)
BENZOIN TINCTURE PRP APPL 2/3 (GAUZE/BANDAGES/DRESSINGS) IMPLANT
BLADE SURG 15 STRL LF DISP TIS (BLADE) ×2 IMPLANT
BLADE SURG 15 STRL SS (BLADE) ×1
BNDG CMPR 5X4 KNIT ELC UNQ LF (GAUZE/BANDAGES/DRESSINGS) ×1
BNDG CMPR 9X4 STRL LF SNTH (GAUZE/BANDAGES/DRESSINGS) ×1
BNDG ELASTIC 4INX 5YD STR LF (GAUZE/BANDAGES/DRESSINGS) ×2 IMPLANT
BNDG ESMARK 4X9 LF (GAUZE/BANDAGES/DRESSINGS) ×2 IMPLANT
CORD BIPOLAR FORCEPS 12FT (ELECTRODE) ×2 IMPLANT
COVER BACK TABLE 60X90IN (DRAPES) ×2 IMPLANT
COVER MAYO STAND STRL (DRAPES) ×2 IMPLANT
CUFF TOURN SGL QUICK 18X4 (TOURNIQUET CUFF) ×2 IMPLANT
DRAPE EXTREMITY T 121X128X90 (DISPOSABLE) ×2 IMPLANT
DRAPE SURG 17X23 STRL (DRAPES) ×2 IMPLANT
DURAPREP 26ML APPLICATOR (WOUND CARE) ×2 IMPLANT
GAUZE SPONGE 4X4 12PLY STRL (GAUZE/BANDAGES/DRESSINGS) ×2 IMPLANT
GAUZE XEROFORM 1X8 LF (GAUZE/BANDAGES/DRESSINGS) ×2 IMPLANT
GLOVE BIO SURGEON STRL SZ 6.5 (GLOVE) IMPLANT
GLOVE BIO SURGEON STRL SZ7 (GLOVE) IMPLANT
GLOVE BIO SURGEON STRL SZ7.5 (GLOVE) ×2 IMPLANT
GLOVE BIOGEL PI IND STRL 7.0 (GLOVE) IMPLANT
GLOVE BIOGEL PI IND STRL 8 (GLOVE) ×2 IMPLANT
GOWN STRL REUS W/ TWL LRG LVL3 (GOWN DISPOSABLE) ×2 IMPLANT
GOWN STRL REUS W/ TWL XL LVL3 (GOWN DISPOSABLE) ×2 IMPLANT
GOWN STRL REUS W/TWL LRG LVL3 (GOWN DISPOSABLE) ×2
GOWN STRL REUS W/TWL XL LVL3 (GOWN DISPOSABLE) ×1
NDL HYPO 25X1 1.5 SAFETY (NEEDLE) ×2 IMPLANT
NEEDLE HYPO 25X1 1.5 SAFETY (NEEDLE) ×1
NS IRRIG 1000ML POUR BTL (IV SOLUTION) ×2 IMPLANT
PACK BASIN DAY SURGERY FS (CUSTOM PROCEDURE TRAY) ×2 IMPLANT
PAD CAST 3X4 CTTN HI CHSV (CAST SUPPLIES) ×2 IMPLANT
PADDING CAST ABS COTTON 4X4 ST (CAST SUPPLIES) ×2 IMPLANT
PADDING CAST COTTON 3X4 STRL (CAST SUPPLIES) ×1
STOCKINETTE 4X48 STRL (DRAPES) ×2 IMPLANT
STRIP CLOSURE SKIN 1/2X4 (GAUZE/BANDAGES/DRESSINGS) IMPLANT
SUT ETHILON 3 0 PS 1 (SUTURE) ×2 IMPLANT
SYR BULB EAR ULCER 3OZ GRN STR (SYRINGE) ×2 IMPLANT
SYR CONTROL 10ML LL (SYRINGE) IMPLANT
TOWEL GREEN STERILE FF (TOWEL DISPOSABLE) ×2 IMPLANT
UNDERPAD 30X36 HEAVY ABSORB (UNDERPADS AND DIAPERS) ×2 IMPLANT

## 2023-03-17 NOTE — Anesthesia Postprocedure Evaluation (Signed)
Anesthesia Post Note  Patient: David Graves  Procedure(s) Performed: RIGHT CARPAL TUNNEL RELEASE (Right: Wrist)     Patient location during evaluation: PACU Anesthesia Type: MAC Level of consciousness: awake and alert and oriented Pain management: pain level controlled Vital Signs Assessment: post-procedure vital signs reviewed and stable Respiratory status: spontaneous breathing, nonlabored ventilation and respiratory function stable Cardiovascular status: stable and blood pressure returned to baseline Postop Assessment: no apparent nausea or vomiting Anesthetic complications: no   No notable events documented.  Last Vitals:  Vitals:   03/17/23 0853 03/17/23 0908  BP: (!) 160/81 (!) 171/66  Pulse: (!) 51 (!) 42  Resp: 15 18  Temp:  (!) 36.4 C  SpO2: 100% 100%    Last Pain:  Vitals:   03/17/23 0908  TempSrc: Temporal  PainSc: 0-No pain                 Chanel Mcadams A.

## 2023-03-17 NOTE — Interval H&P Note (Signed)
History and Physical Interval Note:  03/17/2023 7:44 AM  David Graves  has presented today for surgery, with the diagnosis of right carpal tunnel syndrome.  The various methods of treatment have been discussed with the patient and family. After consideration of risks, benefits and other options for treatment, the patient has consented to  Procedure(s): RIGHT CARPAL TUNNEL RELEASE (Right) as a surgical intervention.  The patient's history has been reviewed, patient examined, no change in status, stable for surgery.  I have reviewed the patient's chart and labs.  Questions were answered to the patient's satisfaction.     [name]

## 2023-03-17 NOTE — Transfer of Care (Signed)
Immediate Anesthesia Transfer of Care Note  Patient: Adithya Difrancesco  Procedure(s) Performed: RIGHT CARPAL TUNNEL RELEASE (Right: Wrist)  Patient Location: PACU  Anesthesia Type:MAC  Level of Consciousness: awake and patient cooperative  Airway & Oxygen Therapy: Patient Spontanous Breathing and Patient connected to face mask oxygen  Post-op Assessment: Report given to RN and Post -op Vital signs reviewed and stable  Post vital signs: Reviewed and stable  Last Vitals:  Vitals Value Taken Time  BP    Temp    Pulse 45 03/17/23 0833  Resp 18 03/17/23 0833  SpO2 100 % 03/17/23 0833  Vitals shown include unfiled device data.  Last Pain:  Vitals:   03/17/23 0626  TempSrc: Oral  PainSc: 0-No pain      Patients Stated Pain Goal: 3 (03/17/23 8469)  Complications: No notable events documented.

## 2023-03-17 NOTE — Interval H&P Note (Signed)
History and Physical Interval Note:  03/17/2023 8:45 AM  David Graves  has presented today for surgery, with the diagnosis of right carpal tunnel syndrome.  The various methods of treatment have been discussed with the patient and family. After consideration of risks, benefits and other options for treatment, the patient has consented to  Procedure(s): RIGHT CARPAL TUNNEL RELEASE (Right) as a surgical intervention.  The patient's history has been reviewed, patient examined, no change in status, stable for surgery.  I have reviewed the patient's chart and labs.  Questions were answered to the patient's satisfaction.     [name]

## 2023-03-17 NOTE — Op Note (Signed)
Pre and postop diagnosis: Right carpal tunnel syndrome  Procedure: Right carpal tunnel release  Surgeon: Annell Greening, MD  Anesthesia: MAC IV sedation plus local 5 cc.  Tourniquet: Less than 10 minutes x 250.  Procedure after standard prepping draping with forearm tourniquet ChloraPrep was used to patient's shellfish allergies.  Use extremity sheets and drapes timeout procedure was completed no antibiotics were indicated.  Sterile skin marker was used using [employer].  Local was infiltrated combination of Marcaine plus lidocaine plain for planned incision and hand was wrapped in Esmarch tourniquet inflated.  Incision was made cutting through the skin and subcutaneous tissue bipolar cautery was used for hemostasis for small subcutaneous venous bleeders.  Palmaris muscle belly was visualized and transverse carpal ligament was divided staying on the ulnar aspect of the median nerve.  Complete release with visualization of the arch buried in the palmar fat.  Approximately fingertip could be placed up to the distal forearm make sure there is complete release above the distal wrist crease.  Nerve was free there were no masses in the carpal canal just chronic tenosynovitis.  Irrigation with saline solution tourniquet deflated hemostasis obtained with Vicryl bipolar cautery then standard nylon closure interrupted.  Xeroform 4 x 4's tween nares Webril and Ace wrap was applied for postoperative dressing.  Outpatient surgery is appropriate for treatment of this condition also follow-up 1 week.

## 2023-03-17 NOTE — Discharge Instructions (Addendum)
  Post Anesthesia Home Care Instructions  Activity: Get plenty of rest for the remainder of the day. A responsible individual must stay with you for 24 hours following the procedure.  For the next 24 hours, DO NOT: -Drive a car -Advertising copywriter -Drink alcoholic beverages -Take any medication unless instructed by your physician -Make any legal decisions or sign important papers.  Meals: Start with liquid foods such as gelatin or soup. Progress to regular foods as tolerated. Avoid greasy, spicy, heavy foods. If nausea and/or vomiting occur, drink only clear liquids until the nausea and/or vomiting subsides. Call your physician if vomiting continues.  Special Instructions/Symptoms: Your throat may feel dry or sore from the anesthesia or the breathing tube placed in your throat during surgery. If this causes discomfort, gargle with warm salt water. The discomfort should disappear within 24 hours.  If you had a scopolamine patch placed behind your ear for the management of post- operative nausea and/or vomiting:  1. The medication in the patch is effective for 72 hours, after which it should be removed.  Wrap patch in a tissue and discard in the trash. Wash hands thoroughly with soap and water. 2. You may remove the patch earlier than 72 hours if you experience unpleasant side effects which may include dry mouth, dizziness or visual disturbances. 3. Avoid touching the patch. Wash your hands with soap and water after contact with the patch.     Keep hand elevated above your heart for a few days to help with pain.  Pain medications been sent into your pharmacy.  Keep dressing dry and see Dr. Ophelia Charter in 8 days in the office for dressing change.  You can use some ibuprofen or Aleve if tolerated to help with the pain.

## 2023-03-17 NOTE — H&P (Signed)
Requested by: Mila Palmer, MD [address] [place],  [place] [address] PCP: Mila Palmer, MD     Assessment & Plan: Visit Diagnoses:  1. Carpal tunnel syndrome, right upper limb       Plan: Patient wants to talk with Cordelia Pen about scheduling right carpal tunnel release, day surgery local standby he previously had sedation plus local.  Will schedule for lumbar MRI with general anesthesia.  I discussed with him that there may be a delay in this since this would require CRNA and there is some CRNA scheduling problems currently.  He can return to see me after MRI scan and then we can discuss lumbar decompression surgery as we have done in the past.   Follow-Up Instructions: No follow-ups on file.    Orders:  No orders of the defined types were placed in this encounter.   No orders of the defined types were placed in this encounter.        Procedures: No procedures performed     Clinical Data: No additional findings.     Subjective:    Chief Complaint  Patient presents with   Lower Back - Pain      HPI 67 year old male returns.  He states his back pain has gotten significantly worse.  He has had significant stenosis at L3-4 and previous lumbar decompression surgery has been discussed.  He reminds me that he is still claustrophobic that he always has to undergo general anesthesia since he cannot tolerate it even with Valium in an open scanner.  Lumbar MRI scan 2022 ordered by Dr. Ferdie Ping showed severe spinal stenosis of the right neural foramina and moderate to severe central stenosis and moderate L5-S1 neuroforaminal narrowing.  In addition has had carpal tunnel syndrome positive electrical studies and is put off having the surgery done and states now his right hand is giving him severe problems and wants to proceed with right carpal tunnel release.  His left carpal tunnel release was done by me in November and is doing well.  He is also had anterior cervical discectomy  and fusion 3 level of the C4-C7 doing well.  Patient states he wants to proceed with a new MRI and wants to schedule lumbar decompression surgery and also wants to proceed with right carpal tunnel release.  Patient's been out of work.  He is having problems standing only stand for a few minutes has to sit after sitting for for 5 minutes he has to get back up and move around.  He is having trouble sleeping.  Unable to walk more than short distance.  He gets slight relief leaning on a grocery cart but this only lasts for 15 to 20 seconds.  During the exam today moves from chair to standing leaning on the counter back to the chair and then back standing again multiple times.   Review of Systems all systems noncontributory to HPI.     Objective: Vital Signs: BP (!) 154/82   Pulse 63    Physical Exam Constitutional:      Appearance: He is well-developed.  HENT:     Head: Normocephalic and atraumatic.     Right Ear: External ear normal.     Left Ear: External ear normal.  Eyes:     Pupils: Pupils are equal, round, and reactive to light.  Neck:     Thyroid: No thyromegaly.     Trachea: No tracheal deviation.  Cardiovascular:     Rate and Rhythm: Normal rate.  Pulmonary:     Effort: Pulmonary effort is normal.     Breath sounds: No wheezing.  Abdominal:     General: Bowel sounds are normal.     Palpations: Abdomen is soft.  Musculoskeletal:     Cervical back: Neck supple.  Skin:    General: Skin is warm and dry.     Capillary Refill: Capillary refill takes less than 2 seconds.  Neurological:     Mental Status: He is alert and oriented to person, place, and time.  Psychiatric:        Behavior: Behavior normal.        Thought Content: Thought content normal.        Judgment: Judgment normal.        Ortho Exam well-healed left carpal tunnel incision.  Right positive Phalen's positive carpal compression test.  Knee and ankle jerk are intact negative logroll hips.  Distal pulses are  intact.   Specialty Comments:  EXAM: MRI LUMBAR SPINE WITHOUT CONTRAST   TECHNIQUE: Multiplanar, multisequence MR imaging of the lumbar spine was performed. No intravenous contrast was administered.   COMPARISON: 09/27/2020 and prior.   FINDINGS: Segmentation: Standard.   Alignment: Straightening of lordosis. Trace grade 1 L3-4 anterolisthesis. Congenital spinal canal narrowing.   Vertebrae: Vertebral body heights are preserved. Modic type 2 endplate degenerative changes. No aggressive osseous lesion.   Conus medullaris and cauda equina: Conus extends to the L2 level. Conus and cauda equina appear normal.   Disc levels: Multilevel desiccation.   L1-2: No significant disc bulge. Prominent ligamentum flavum and facet degenerative spurring. Patent spinal canal and neural foramen.   L2-3: Disc bulge with left foraminal/extraforaminal protrusion with annular fissuring. Facet degenerative spurring. Mild spinal canal and bilateral neural foraminal narrowing.   L3-4: Disc bulge with superimposed bilateral foraminal protrusions. Prominent ligamentum flavum and bilateral facet hypertrophy. Moderate to severe spinal canal narrowing. Severe right and mild left neural foraminal narrowing.   L4-5: Disc bulge, ligamentum flavum thickening and bilateral facet hypertrophy. Mild spinal canal and bilateral neural foraminal narrowing.   L5-S1: Disc bulge with inferiorly migrated right subarticular extrusion abutting the descending S1 nerve root. Bilateral facet hypertrophy. Mild spinal canal narrowing. Moderate right and mild left neural foraminal narrowing.   Paraspinal and other soft tissues: Negative.   IMPRESSION: 1. Multilevel spondylosis. Moderate to severe spinal canal and right neural foraminal narrowing at the L3-4 level. 2. Moderate right L5-S1 neural foraminal narrowing. 3. Otherwise mild spinal canal/neural foraminal narrowing at the L2-S1 levels.     Electronically  Signed By: Stana Bunting M.D. On: 10/17/2020 15:23   Imaging: No results found.     PMFS History:     Patient Active Problem List    Diagnosis Date Noted   Carpal tunnel syndrome, right upper limb 02/25/2023   Carpal tunnel syndrome, left upper limb 05/03/2022   S/P cervical spinal fusion 08/14/2021   Other spondylosis with radiculopathy, cervical region 07/02/2021   Foraminal stenosis of cervical region 07/02/2021   GERD (gastroesophageal reflux disease) 06/28/2020   Hyperlipidemia 06/28/2020   Hypertension 06/28/2020   Bilateral hand pain 06/28/2020   Rheumatoid factor positive 06/28/2020   Anxiety 12/09/2018        Past Medical History:  Diagnosis Date   Anxiety     Asthma      no inhaler   Atopic dermatitis     Bee sting     BMI 29.0-29.9,adult     Cervical radiculopathy     Chronic insomnia  Complaint of paresthesia     Elevated blood pressure reading     Epididymitis     Family history of prostate cancer     GERD (gastroesophageal reflux disease)     Gout     Heart murmur     Hypertension     Lateral epicondylitis of right elbow     Pure hypercholesterolemia     Seasonal allergic rhinitis due to pollen               Family History  Problem Relation Age of Onset   Healthy Son     Healthy Son     Lupus Niece               Past Surgical History:  Procedure Laterality Date   ANTERIOR CERVICAL DECOMP/DISCECTOMY FUSION N/A 08/06/2021    Procedure: ANTERIOR CERVICAL DISCECTOMY FUSION C4-5, C5-6, C6-7, ALLOGRAFT, PLATE;  Surgeon: [name], MD;  Location: [facility];  Service: Orthopedics;  Laterality: N/A;   CARPAL TUNNEL RELEASE Left 07/01/2022    Procedure: LEFT CARPAL TUNNEL RELEASE;  Surgeon: [name], MD;  Location: [facility];  Service: Orthopedics;  Laterality: Left;   COLONOSCOPY   10/20/2017   RADIOLOGY WITH ANESTHESIA N/A 10/17/2020    Procedure: MRI WITH ANESTHESIA  LUMBAR WITHOUT CONTRAST AND CERVICAL WITHOUT  CONTRAST;  Surgeon: Radiologist, Medication, MD;  Location: [facility];  Service: Radiology;  Laterality: N/A;   UPPER GI ENDOSCOPY       VASECTOMY        Per patient        Social History         Occupational History   Not on file  Tobacco Use   Smoking status: Former      Types: Cigarettes      Quit date: 05/16/2012      Years since quitting: 10.7   Smokeless tobacco: Never  Vaping Use   Vaping Use: Never used  Substance and Sexual Activity   Alcohol use: Not Currently   Drug use: Yes      Frequency: 4.0 times per week      Types: Marijuana      Comment: uses daily   Sexual activity: Yes

## 2023-03-18 ENCOUNTER — Encounter (HOSPITAL_BASED_OUTPATIENT_CLINIC_OR_DEPARTMENT_OTHER): Payer: Self-pay | Admitting: Orthopaedic Surgery

## 2023-03-25 ENCOUNTER — Ambulatory Visit (INDEPENDENT_AMBULATORY_CARE_PROVIDER_SITE_OTHER): Payer: Medicare Other | Admitting: Orthopaedic Surgery

## 2023-03-25 DIAGNOSIS — Z713 Dietary counseling and surveillance: Secondary | ICD-10-CM | POA: Diagnosis not present

## 2023-03-25 DIAGNOSIS — I10 Essential (primary) hypertension: Secondary | ICD-10-CM | POA: Diagnosis not present

## 2023-03-25 DIAGNOSIS — E7800 Pure hypercholesterolemia, unspecified: Secondary | ICD-10-CM | POA: Diagnosis not present

## 2023-03-25 DIAGNOSIS — M48061 Spinal stenosis, lumbar region without neurogenic claudication: Secondary | ICD-10-CM

## 2023-03-25 DIAGNOSIS — G5601 Carpal tunnel syndrome, right upper limb: Secondary | ICD-10-CM

## 2023-03-25 DIAGNOSIS — K2950 Unspecified chronic gastritis without bleeding: Secondary | ICD-10-CM | POA: Diagnosis not present

## 2023-03-25 MED ORDER — OXYCODONE-ACETAMINOPHEN 5-325 MG PO TABS: 1.0000 | ORAL_TABLET | Freq: Four times a day (QID) | ORAL | 0 refills | Status: DC | PRN

## 2023-03-25 NOTE — Progress Notes (Signed)
Post-Op Visit Note   Patient: David Graves           Date of Birth: [birth year]           MRN: [id number] Visit Date: 03/25/2023 PCP: Mila Palmer, MD   Assessment & Plan: Postop right carpal tunnel release.  Incision looks good he will return in 1 week for suture removal.  Stockinette and wrist splint applied he can remove the wrist splint to take a shower.  Patient continues to have terrible problems with his back.  Previous MRI scan 10/17/2020 showed moderate to severe stenosis.  He also had some neuroforaminal narrowing L5-S1 and mild narrowing at other levels.  We had scheduled him for an MRI he has had to have general anesthesia for his previous scans due to severe claustrophobia and even with Valium cannot tolerate an open MRI.  Earliest we can schedule him due to the CRNA shortages at the end of October.  He currently has a date but is called multiple x 1 to see if he can get it scheduled in another town or another facility etc.  I discussed with him possibly lumbar myelogram CAT scan and he thinks he could fit into a CAT scan due to the size of the hole being much larger.  I discussed with him out for a month would not be an office and we can have him see Dr. Christell Constant who can discuss with him ordering a lumbar myelogram CT scan after reviewing his 10/17/2020 MRI scan.  Today as he moves around he jumps intermittently and wincing with pain almost like an electrical shock.  Percocet prescription given 20 tablets.  He can return in 1 week to see another provider/ PA for suture removal.  Chief Complaint: No chief complaint on file.  Visit Diagnoses:  1. Carpal tunnel syndrome, right upper limb   2. Spinal stenosis of lumbar region without neurogenic claudication     Plan: Return in 1 week for suture removal carpal tunnel.  Follow-Up Instructions: Return in about 1 week (around 04/01/2023).   Orders:  No orders of the defined types were placed in this encounter.  Meds ordered this  encounter  Medications   oxyCODONE-acetaminophen (PERCOCET) 5-325 MG tablet    Sig: Take 1-2 tablets by mouth every 6 (six) hours as needed for severe pain.    Dispense:  20 tablet    Refill:  0    Post op carpal tunnel release    Imaging: No results found.  PMFS History: Patient Active Problem List   Diagnosis Date Noted   Carpal tunnel syndrome, right upper limb 02/25/2023   Spinal stenosis of lumbar region 02/25/2023   Carpal tunnel syndrome, left upper limb 05/03/2022   S/P cervical spinal fusion 08/14/2021   Other spondylosis with radiculopathy, cervical region 07/02/2021   Foraminal stenosis of cervical region 07/02/2021   GERD (gastroesophageal reflux disease) 06/28/2020   Hyperlipidemia 06/28/2020   Hypertension 06/28/2020   Bilateral hand pain 06/28/2020   Rheumatoid factor positive 06/28/2020   Anxiety 12/09/2018   Past Medical History:  Diagnosis Date   Anxiety    Asthma    no inhaler   Atopic dermatitis    Bee sting    BMI 29.0-29.9,adult    Cervical radiculopathy    Chronic insomnia    Complaint of paresthesia    Elevated blood pressure reading    Epididymitis    Family history of prostate cancer    GERD (gastroesophageal reflux disease)  Gout    Heart murmur    Hypertension    Lateral epicondylitis of right elbow    Pure hypercholesterolemia    Seasonal allergic rhinitis due to pollen     Family History  Problem Relation Age of Onset   Healthy Son    Healthy Son    Lupus Niece     Past Surgical History:  Procedure Laterality Date   ANTERIOR CERVICAL DECOMP/DISCECTOMY FUSION N/A 08/06/2021   Procedure: ANTERIOR CERVICAL DISCECTOMY FUSION C4-5, C5-6, C6-7, ALLOGRAFT, PLATE;  Surgeon: [name], MD;  Location: [facility];  Service: Orthopedics;  Laterality: N/A;   CARPAL TUNNEL RELEASE Left 07/01/2022   Procedure: LEFT CARPAL TUNNEL RELEASE;  Surgeon: [name], MD;  Location: [facility];  Service: Orthopedics;  Laterality:  Left;   CARPAL TUNNEL RELEASE Right 03/17/2023   Procedure: RIGHT CARPAL TUNNEL RELEASE;  Surgeon: [name], MD;  Location: [facility];  Service: Orthopedics;  Laterality: Right;   COLONOSCOPY  10/20/2017   RADIOLOGY WITH ANESTHESIA N/A 10/17/2020   Procedure: MRI WITH ANESTHESIA  LUMBAR WITHOUT CONTRAST AND CERVICAL WITHOUT CONTRAST;  Surgeon: Radiologist, Medication, MD;  Location: [facility];  Service: Radiology;  Laterality: N/A;   UPPER GI ENDOSCOPY     VASECTOMY     Per patient   Social History   Occupational History   Not on file  Tobacco Use   Smoking status: Former    Current packs/day: 0.00    Types: Cigarettes    Quit date: 05/16/2012    Years since quitting: 10.8   Smokeless tobacco: Never  Vaping Use   Vaping status: Never Used  Substance and Sexual Activity   Alcohol use: Not Currently   Drug use: Yes    Frequency: 4.0 times per week    Types: Marijuana    Comment: uses daily   Sexual activity: Yes

## 2023-03-26 ENCOUNTER — Other Ambulatory Visit: Payer: Self-pay

## 2023-03-26 DIAGNOSIS — M5416 Radiculopathy, lumbar region: Secondary | ICD-10-CM

## 2023-04-01 ENCOUNTER — Ambulatory Visit (INDEPENDENT_AMBULATORY_CARE_PROVIDER_SITE_OTHER): Payer: Medicare Other | Admitting: Physician Assistant

## 2023-04-01 ENCOUNTER — Encounter: Payer: Medicare Other | Admitting: Physician Assistant

## 2023-04-01 ENCOUNTER — Encounter: Payer: Self-pay | Admitting: Physician Assistant

## 2023-04-01 DIAGNOSIS — G5601 Carpal tunnel syndrome, right upper limb: Secondary | ICD-10-CM

## 2023-04-01 NOTE — Progress Notes (Signed)
Office Visit Note   Patient: David Graves           Date of Birth: 1956-05-12           MRN: [id number] Visit Date: 04/01/2023              Requested by: Mila Palmer, MD [address] [place],  [place] [address] PCP: Mila Palmer, MD  Chief Complaint  Patient presents with   Right Hand - Routine Post Op      HPI: The patient is a 67 year old gentleman who is now 2 weeks status post right carpal tunnel release with Dr. Ophelia Charter.  Has had a similar procedure on the left and did well.  Denies any fever or chills.  Here for suture removal  Assessment & Plan: Visit Diagnoses: 2-week status post right carpal tunnel release  Plan: Patient is doing well sutures removed Steri-Strips placed without difficulty.  Should continue to wear his brace another 2 weeks no carrying lifting or pulling with this hand.  Would like to see him back at that time  Follow-Up Instructions: No follow-ups on file.   Ortho Exam  Patient is alert, oriented, no adenopathy, well-dressed, normal affect, normal respiratory effort. Examination of his right hand he is neurovascularly intact strong pulse no paresthesias moves his fingers with ease he has a well-healed surgical incision without any surrounding erythema or cellulitis no drainage from the wound  Imaging: No results found. No images are attached to the encounter.  Labs: Lab Results  Component Value Date   ESRSEDRATE 2 06/16/2020   LABURIC 6.3 06/16/2020   REPTSTATUS 06/10/2022 FINAL 06/05/2022   CULT  06/05/2022    NO GROWTH 5 DAYS Performed at [facility], [address], [place], [place] [address]      Lab Results  Component Value Date   ALBUMIN 5.0 06/05/2022   ALBUMIN 4.2 07/31/2021   ALBUMIN 3.8 03/16/2021    No results found for: "MG" No results found for: "VD25OH"  No results found for: "PREALBUMIN"    Latest Ref Rng & Units 06/05/2022   12:42 PM 07/31/2021    9:16 AM 03/16/2021    1:55 PM   CBC EXTENDED  WBC 4.0 - 10.5 K/uL 11.2  8.5  10.0   RBC 4.22 - 5.81 MIL/uL 5.08  5.03  4.43   Hemoglobin 13.0 - 17.0 g/dL 86.5  78.4  69.6   HCT 39.0 - 52.0 % 46.9  48.0  42.8   Platelets 150 - 400 K/uL 215  242  272   NEUT# 1.7 - 7.7 K/uL 4.9     Lymph# 0.7 - 4.0 K/uL 5.1        There is no height or weight on file to calculate BMI.  Orders:  No orders of the defined types were placed in this encounter.  No orders of the defined types were placed in this encounter.    Procedures: No procedures performed  Clinical Data: No additional findings.  ROS:  All other systems negative, except as noted in the HPI. Review of Systems  Objective: Vital Signs: There were no vitals taken for this visit.  Specialty Comments:  EXAM: MRI LUMBAR SPINE WITHOUT CONTRAST  TECHNIQUE: Multiplanar, multisequence MR imaging of the lumbar spine was performed. No intravenous contrast was administered.  COMPARISON: 09/27/2020 and prior.  FINDINGS: Segmentation: Standard.  Alignment: Straightening of lordosis. Trace grade 1 L3-4 anterolisthesis. Congenital spinal canal narrowing.  Vertebrae: Vertebral body heights are preserved.  Modic type 2 endplate degenerative changes. No aggressive osseous lesion.  Conus medullaris and cauda equina: Conus extends to the L2 level. Conus and cauda equina appear normal.  Disc levels: Multilevel desiccation.  L1-2: No significant disc bulge. Prominent ligamentum flavum and facet degenerative spurring. Patent spinal canal and neural foramen.  L2-3: Disc bulge with left foraminal/extraforaminal protrusion with annular fissuring. Facet degenerative spurring. Mild spinal canal and bilateral neural foraminal narrowing.  L3-4: Disc bulge with superimposed bilateral foraminal protrusions. Prominent ligamentum flavum and bilateral facet hypertrophy. Moderate to severe spinal canal narrowing. Severe right and mild left neural foraminal  narrowing.  L4-5: Disc bulge, ligamentum flavum thickening and bilateral facet hypertrophy. Mild spinal canal and bilateral neural foraminal narrowing.  L5-S1: Disc bulge with inferiorly migrated right subarticular extrusion abutting the descending S1 nerve root. Bilateral facet hypertrophy. Mild spinal canal narrowing. Moderate right and mild left neural foraminal narrowing.  Paraspinal and other soft tissues: Negative.  IMPRESSION: 1. Multilevel spondylosis. Moderate to severe spinal canal and right neural foraminal narrowing at the L3-4 level. 2. Moderate right L5-S1 neural foraminal narrowing. 3. Otherwise mild spinal canal/neural foraminal narrowing at the L2-S1 levels.   Electronically Signed By: Stana Bunting M.D. On: 10/17/2020 15:23  PMFS History: Patient Active Problem List   Diagnosis Date Noted   Carpal tunnel syndrome, right upper limb 02/25/2023   Spinal stenosis of lumbar region 02/25/2023   Carpal tunnel syndrome, left upper limb 05/03/2022   S/P cervical spinal fusion 08/14/2021   Other spondylosis with radiculopathy, cervical region 07/02/2021   Foraminal stenosis of cervical region 07/02/2021   GERD (gastroesophageal reflux disease) 06/28/2020   Hyperlipidemia 06/28/2020   Hypertension 06/28/2020   Bilateral hand pain 06/28/2020   Rheumatoid factor positive 06/28/2020   Anxiety 12/09/2018   Past Medical History:  Diagnosis Date   Anxiety    Asthma    no inhaler   Atopic dermatitis    Bee sting    BMI 29.0-29.9,adult    Cervical radiculopathy    Chronic insomnia    Complaint of paresthesia    Elevated blood pressure reading    Epididymitis    Family history of prostate cancer    GERD (gastroesophageal reflux disease)    Gout    Heart murmur    Hypertension    Lateral epicondylitis of right elbow    Pure hypercholesterolemia    Seasonal allergic rhinitis due to pollen     Family History  Problem Relation Age of Onset   Healthy Son     Healthy Son    Lupus Niece     Past Surgical History:  Procedure Laterality Date   ANTERIOR CERVICAL DECOMP/DISCECTOMY FUSION N/A 08/06/2021   Procedure: ANTERIOR CERVICAL DISCECTOMY FUSION C4-5, C5-6, C6-7, ALLOGRAFT, PLATE;  Surgeon: [name], MD;  Location: [facility];  Service: Orthopedics;  Laterality: N/A;   CARPAL TUNNEL RELEASE Left 07/01/2022   Procedure: LEFT CARPAL TUNNEL RELEASE;  Surgeon: [name], MD;  Location: [facility];  Service: Orthopedics;  Laterality: Left;   CARPAL TUNNEL RELEASE Right 03/17/2023   Procedure: RIGHT CARPAL TUNNEL RELEASE;  Surgeon: [name], MD;  [facility];  Service: Orthopedics;  Laterality: Right;   COLONOSCOPY  10/20/2017   RADIOLOGY WITH ANESTHESIA N/A 10/17/2020   Procedure: MRI WITH ANESTHESIA  LUMBAR WITHOUT CONTRAST AND CERVICAL WITHOUT CONTRAST;  Surgeon: Radiologist, Medication, MD;  Location: [facility];  Service: Radiology;  Laterality: N/A;   UPPER GI ENDOSCOPY  VASECTOMY     Per patient   Social History   Occupational History   Not on file  Tobacco Use   Smoking status: Former    Current packs/day: 0.00    Types: Cigarettes    Quit date: 05/16/2012    Years since quitting: 10.8   Smokeless tobacco: Never  Vaping Use   Vaping status: Never Used  Substance and Sexual Activity   Alcohol use: Not Currently   Drug use: Yes    Frequency: 4.0 times per week    Types: Marijuana    Comment: uses daily   Sexual activity: Yes

## 2023-04-07 ENCOUNTER — Other Ambulatory Visit (INDEPENDENT_AMBULATORY_CARE_PROVIDER_SITE_OTHER): Payer: Medicare Other

## 2023-04-07 ENCOUNTER — Ambulatory Visit (INDEPENDENT_AMBULATORY_CARE_PROVIDER_SITE_OTHER): Payer: Medicare Other | Admitting: Orthopedic Surgery

## 2023-04-07 DIAGNOSIS — M5416 Radiculopathy, lumbar region: Secondary | ICD-10-CM | POA: Diagnosis not present

## 2023-04-07 MED ORDER — OXYCODONE HCL 5 MG PO TABS: 5.0000 mg | ORAL_TABLET | ORAL | 0 refills | Status: AC | PRN

## 2023-04-07 NOTE — Progress Notes (Signed)
Orthopedic Spine Surgery Office Note  Assessment: Patient is a 67 y.o. male with low back pain that radiates into the right lower extremity. Has PI-LL mismatch and lateral listhesis. His mismatch is 24 degrees   Plan: -Patient has tried PT, tylenol NSAIDs, oral steroids, oxycodone -He asked for narcotic pain medication at the end of the visit since this has been helping his pain. Dr. Ophelia Charter had previously prescribed him percocet so I provided him with one refill but then also referred him to pain management. I told him I use narcotics to control post-operative pain but do not use them to control pre-operative pain -MRI of the lumbar spine was ordered since he thinks he can do it not under anesthesia as long has his head is not in the tube -If we decide on surgery, will test his urine pre-op for nicotine -Patient should return to office in 4-5 weeks, x-rays at next visit: scoliosis XRs   Patient expressed understanding of the plan and all questions were answered to the patient's satisfaction.   ___________________________________________________________________________   History:  Patient is a 67 y.o. male who presents today for lumbar spine. Patient has had chronic low back pain but it has gotten significant worse since April of 2024. There was no trauma or injury that preceded the onset of this worsening pain. He also has pain that radiates into his right leg. The pain is felt along the lateral aspect of his right thigh and leg. It does not radiate past the ankle. He has no pain radiating into the left leg. Pain is felt on a daily basis. He feels it with activity and rest. He has not noticed anything that makes it better or worse. Pain has limited his ability to do things he enjoys like jogging and working out. He has had to mostly sit in a chair or lay in bed due to the pain. Dr. Ophelia Charter had prescribed him percocet and he found this helpful.    Weakness: yes, low back feels weaker Symptoms of  imbalance: denies Paresthesias and numbness: denies Bowel or bladder incontinence: denies Saddle anesthesia: denies  Treatments tried: PT, tylenol NSAIDs, oral steroids, oxycodone  Review of systems: Denies fevers and chills, night sweats, unexplained weight loss, history of cancer. Has pain that wakes him at night  Past medical history: Anxiety Asthma Atopic dermatitis Chronic insomnia GERD Gout HTN HLD  Allergies: cefepime, codeine, colchicine, hydrocodone, sulfa, penicillin  Past surgical history:  Bilateral carpal tunnel release C4-7 ACDF Vasectomy  Social history: Denies use of nicotine product (smoking, vaping, patches, smokeless) Alcohol use: denies Reports marijuana use, denies other recreational drug use   Physical Exam:  General: no acute distress, appears stated age Neurologic: alert, answering questions appropriately, following commands Respiratory: unlabored breathing on room air, symmetric chest rise Psychiatric: appropriate affect, normal cadence to speech   MSK (spine):  -Strength exam      Left  Right EHL    5/5  5/5 TA    5/5  5/5 GSC    5/5  5/5 Knee extension  5/5  5/5 Hip flexion   5/5  5/5  -Sensory exam    Sensation intact to light touch in L3-S1 nerve distributions of bilateral lower extremities  -Achilles DTR: 2/4 on the left, 2/4 on the right -Patellar tendon DTR: 2/4 on the left, 2/4 on the right  -Straight leg raise: negative bilaterally -Femoral nerve stretch test: negative bilaterally -Clonus: no beats bilaterally  -Left hip exam: no pain through range of  motion, negative stinchfield, negative faber -Right hip exam: no pain through range of motion, negative stinchfield, negative faber  Imaging: XR of the lumbar spine from 04/07/2023 was independently reviewed and interpreted, showing lateral listhesis at L3/4 and L4/5. Disc height loss at L2/3, L3/4, L4/5, L5/S1. No fracture or dislocation seen. Loss of lumbar lordosis.  PI of 60, LL of 36. PI-LL mismatch 24 degrees.    Patient name: David Graves Patient MRN: [id number] Date of visit: 04/07/23

## 2023-04-16 ENCOUNTER — Ambulatory Visit (INDEPENDENT_AMBULATORY_CARE_PROVIDER_SITE_OTHER): Payer: Medicare Other | Admitting: Physician Assistant

## 2023-04-16 ENCOUNTER — Encounter: Payer: Self-pay | Admitting: Physician Assistant

## 2023-04-16 DIAGNOSIS — G5601 Carpal tunnel syndrome, right upper limb: Secondary | ICD-10-CM

## 2023-04-16 NOTE — Progress Notes (Signed)
Office Visit Note   Patient: David Graves           Date of Birth: 27-Nov-1955           MRN: [id number] Visit Date: 04/16/2023              Requested by: Mila Palmer, MD [address] [place],  [place] [address] PCP: Mila Palmer, MD  Chief Complaint  Patient presents with   Right Wrist - Follow-up      HPI: Patient is a pleasant right-hand-dominant 67 year old gentleman who is a patient of Dr. Ophelia Charter.  He is 1 month status post right carpal tunnel release.  He is have the same procedure on the other side and feels that his recovery has been similar.  Denies any fever or chills.  Is currently getting worked up for a lumbar back issue.  Assessment & Plan: Visit Diagnoses: 1 month status post above procedure, doing well.  Plan: Encouraged him to use Palmer's skin lotion or other softening agents to soften his scar a little bit.  The wound is well-healed.  He does have superficial skin flaking but everything is healed deeply and this is very superficial there is been no drainage will follow-up if needed  Follow-Up Instructions: Return if symptoms worsen or fail to improve.   Ortho Exam  Patient is alert, oriented, no adenopathy, well-dressed, normal affect, normal respiratory effort. Right hand well-healing surgical incision does have some callusing at the wound site but wound is healed.  No cellulitis no fluctuance no evidence of infection he can oppose all of his fingers.  Radial pulses intact he has brisk capillary refill sensation is improved  Imaging: No results found. No images are attached to the encounter.  Labs: Lab Results  Component Value Date   ESRSEDRATE 2 06/16/2020   LABURIC 6.3 06/16/2020   REPTSTATUS 06/10/2022 FINAL 06/05/2022   CULT  06/05/2022    NO GROWTH 5 DAYS Performed at [facility], [address] [place], [place] [address]      Lab Results  Component Value Date   ALBUMIN 5.0 06/05/2022   ALBUMIN 4.2  07/31/2021   ALBUMIN 3.8 03/16/2021    No results found for: "MG" No results found for: "VD25OH"  No results found for: "PREALBUMIN"    Latest Ref Rng & Units 06/05/2022   12:42 PM 07/31/2021    9:16 AM 03/16/2021    1:55 PM  CBC EXTENDED  WBC 4.0 - 10.5 K/uL 11.2  8.5  10.0   RBC 4.22 - 5.81 MIL/uL 5.08  5.03  4.43   Hemoglobin 13.0 - 17.0 g/dL 44.0  10.2  72.5   HCT 39.0 - 52.0 % 46.9  48.0  42.8   Platelets 150 - 400 K/uL 215  242  272   NEUT# 1.7 - 7.7 K/uL 4.9     Lymph# 0.7 - 4.0 K/uL 5.1        There is no height or weight on file to calculate BMI.  Orders:  No orders of the defined types were placed in this encounter.  No orders of the defined types were placed in this encounter.    Procedures: No procedures performed  Clinical Data: No additional findings.  ROS:  All other systems negative, except as noted in the HPI. Review of Systems  Objective: Vital Signs: There were no vitals taken for this visit.  Specialty Comments:  EXAM: MRI LUMBAR SPINE WITHOUT CONTRAST  TECHNIQUE: Multiplanar, multisequence MR  imaging of the lumbar spine was performed. No intravenous contrast was administered.  COMPARISON: 09/27/2020 and prior.  FINDINGS: Segmentation: Standard.  Alignment: Straightening of lordosis. Trace grade 1 L3-4 anterolisthesis. Congenital spinal canal narrowing.  Vertebrae: Vertebral body heights are preserved. Modic type 2 endplate degenerative changes. No aggressive osseous lesion.  Conus medullaris and cauda equina: Conus extends to the L2 level. Conus and cauda equina appear normal.  Disc levels: Multilevel desiccation.  L1-2: No significant disc bulge. Prominent ligamentum flavum and facet degenerative spurring. Patent spinal canal and neural foramen.  L2-3: Disc bulge with left foraminal/extraforaminal protrusion with annular fissuring. Facet degenerative spurring. Mild spinal canal and bilateral neural foraminal  narrowing.  L3-4: Disc bulge with superimposed bilateral foraminal protrusions. Prominent ligamentum flavum and bilateral facet hypertrophy. Moderate to severe spinal canal narrowing. Severe right and mild left neural foraminal narrowing.  L4-5: Disc bulge, ligamentum flavum thickening and bilateral facet hypertrophy. Mild spinal canal and bilateral neural foraminal narrowing.  L5-S1: Disc bulge with inferiorly migrated right subarticular extrusion abutting the descending S1 nerve root. Bilateral facet hypertrophy. Mild spinal canal narrowing. Moderate right and mild left neural foraminal narrowing.  Paraspinal and other soft tissues: Negative.  IMPRESSION: 1. Multilevel spondylosis. Moderate to severe spinal canal and right neural foraminal narrowing at the L3-4 level. 2. Moderate right L5-S1 neural foraminal narrowing. 3. Otherwise mild spinal canal/neural foraminal narrowing at the L2-S1 levels.   Electronically Signed By: Stana Bunting M.D. On: 10/17/2020 15:23  PMFS History: Patient Active Problem List   Diagnosis Date Noted   Carpal tunnel syndrome, right upper limb 02/25/2023   Spinal stenosis of lumbar region 02/25/2023   Carpal tunnel syndrome, left upper limb 05/03/2022   S/P cervical spinal fusion 08/14/2021   Other spondylosis with radiculopathy, cervical region 07/02/2021   Foraminal stenosis of cervical region 07/02/2021   GERD (gastroesophageal reflux disease) 06/28/2020   Hyperlipidemia 06/28/2020   Hypertension 06/28/2020   Bilateral hand pain 06/28/2020   Rheumatoid factor positive 06/28/2020   Anxiety 12/09/2018   Past Medical History:  Diagnosis Date   Anxiety    Asthma    no inhaler   Atopic dermatitis    Bee sting    BMI 29.0-29.9,adult    Cervical radiculopathy    Chronic insomnia    Complaint of paresthesia    Elevated blood pressure reading    Epididymitis    Family history of prostate cancer    GERD (gastroesophageal reflux  disease)    Gout    Heart murmur    Hypertension    Lateral epicondylitis of right elbow    Pure hypercholesterolemia    Seasonal allergic rhinitis due to pollen     Family History  Problem Relation Age of Onset   Healthy Son    Healthy Son    Lupus Niece     Past Surgical History:  Procedure Laterality Date   ANTERIOR CERVICAL DECOMP/DISCECTOMY FUSION N/A 08/06/2021   Procedure: ANTERIOR CERVICAL DISCECTOMY FUSION C4-5, C5-6, C6-7, ALLOGRAFT, PLATE;  Surgeon: [name], MD;  Location: [facility];  Service: Orthopedics;  Laterality: N/A;   CARPAL TUNNEL RELEASE Left 07/01/2022   Procedure: LEFT CARPAL TUNNEL RELEASE;  Surgeon: [name], MD;  Location: [facility];  Service: Orthopedics;  Laterality: Left;   CARPAL TUNNEL RELEASE Right 03/17/2023   Procedure: RIGHT CARPAL TUNNEL RELEASE;  Surgeon: [name], MD;  Location: [facility];  Service: Orthopedics;  Laterality: Right;   COLONOSCOPY  10/20/2017  RADIOLOGY WITH ANESTHESIA N/A 10/17/2020   Procedure: MRI WITH ANESTHESIA  LUMBAR WITHOUT CONTRAST AND CERVICAL WITHOUT CONTRAST;  Surgeon: Radiologist, Medication, MD;  Location: [facility];  Service: Radiology;  Laterality: N/A;   UPPER GI ENDOSCOPY     VASECTOMY     Per patient   Social History   Occupational History   Not on file  Tobacco Use   Smoking status: Former    Current packs/day: 0.00    Types: Cigarettes    Quit date: 05/16/2012    Years since quitting: 10.9   Smokeless tobacco: Never  Vaping Use   Vaping status: Never Used  Substance and Sexual Activity   Alcohol use: Not Currently   Drug use: Yes    Frequency: 4.0 times per week    Types: Marijuana    Comment: uses daily   Sexual activity: Yes

## 2023-04-21 ENCOUNTER — Other Ambulatory Visit: Payer: Medicare Other

## 2023-04-21 DIAGNOSIS — M5416 Radiculopathy, lumbar region: Secondary | ICD-10-CM

## 2023-04-21 DIAGNOSIS — R3912 Poor urinary stream: Secondary | ICD-10-CM | POA: Diagnosis not present

## 2023-04-22 ENCOUNTER — Other Ambulatory Visit: Payer: Self-pay | Admitting: Urology

## 2023-04-22 ENCOUNTER — Encounter (HOSPITAL_BASED_OUTPATIENT_CLINIC_OR_DEPARTMENT_OTHER): Payer: Self-pay | Admitting: Urology

## 2023-04-22 NOTE — Progress Notes (Signed)
Spoke w/ via phone for pre-op interview--- pt Lab needs dos----   [facility] Rx results------ current EKG in epic/ chart COVID test -----patient states asymptomatic no test needed Arrive at ------- 0700 on 04-25-2023 NPO after MN NO Solid Food.  Clear liquids from MN until--- 0600 Med rec completed Medications to take morning of surgery ----- lipitor, norvasc, flomax, protoinx Diabetic medication ----- n/a Patient instructed no nail polish to be worn day of surgery Patient instructed to bring photo id and insurance card day of surgery Patient aware to have Driver (ride ) / caregiver    for 24 hours after surgery -- wife, sophia Patient Special Instructions ----- n/a Pre-Op special Instructions ----- n/a Patient verbalized understanding of instructions that were given at this phone interview. Patient denies shortness of breath, chest pain, fever, cough at this phone interview.

## 2023-04-24 NOTE — H&P (Signed)
Office Visit Report     04/21/2023   --------------------------------------------------------------------------------   David Graves  MRN: [id number]  DOB: 17-Nov-1955, 67 year old Male  SSN:    PRIMARY CARE:  Mila Palmer, MD  PRIMARY CARE FAX:  [phone]  REFERRING:  Mila Palmer, MD  PROVIDER:  Jerilee Field, M.D.  LOCATION:  [facility] [phone]     --------------------------------------------------------------------------------   CC/HPI: F/u -   1) BPH, LUTS - pt with weak stream and frequency. No prior GU surgery. Tried tamsulosin and had bothersome RGE. He had 2-4 times per night and slow stream.   PSA was 0.9 in 2019 and 1.5 in Jun 2023. Cr 0.93. His older brother had PCa but died from Covid in his early 27s. He had a vasectomy and had some left testicle pain on and off.   Prostate ultrasound revealed a 32 g prostate. Cystoscopy in 2023 -mild bph with BOO and small median lobe/ he did start daily tadlafil. Voiding OK. Erection better.   Today, seen for the above. He needs a refill of tadalafil.   He retired as a Theatre manager.     ALLERGIES: Bee Venom Cinnamon Codeine colchicine Hydrocodone-Acetaminophen Penicillin Shellfish    MEDICATIONS: Amlodipine Besylate 10 mg tablet  Aspirin Ec 81 mg tablet, delayed release  Atorvastatin Calcium  Fluconazole 150 mg tablet  Gas X  Pantoprazole Sodium  Tadalafil 5 mg tablet 1 tablet PO Daily  Tylenol     GU PSH: Cystoscopy - 06/26/2022 Vasectomy - about 1998     NON-GU PSH: Neck Surgery     GU PMH: BPH w/LUTS, disc nature r/b of WVTT or Thu LVP. He will consider. Cont tadalafil. - 06/26/2022, - 05/01/2022 Weak Urinary Stream - 06/26/2022, We discussed the nature r/b of surveillance, alpha blocker, 5ari, daily pde5i, OAB meds and procedures such as TURP, OBD, LVP, LEP, RWJ, RSLP in the OR or PUL or WVT in the office. Will start a daily tadalafil and f/u for prostate [place]  and cystoscopy. Exam benign and PSA rise c/w with BPH. , - 05/01/2022    NON-GU PMH: Anxiety Arthritis Asthma Cardiac murmur, unspecified GERD Gout Hypercholesterolemia Hypertension Sleep Apnea    FAMILY HISTORY: 1 Daughter - Runs in Family 2 sons - Runs in Family Prostate Cancer - Brother Sickle Cell Hb-[place] Disease - Sister   SOCIAL HISTORY: Marital Status: Single Preferred Language: English; Ethnicity: Not Hispanic Or Latino; Race: Black or African American Current Smoking Status: Patient has never smoked.   Tobacco Use Assessment Completed: Used Tobacco in last 30 days? Has never drank.  Does not drink caffeine.    REVIEW OF SYSTEMS:    GU Review Male:   Patient denies frequent urination, hard to postpone urination, burning/ pain with urination, get up at night to urinate, leakage of urine, stream starts and stops, trouble starting your stream, have to strain to urinate , erection problems, and penile pain.  Gastrointestinal (Upper):   Patient denies nausea, vomiting, and indigestion/ heartburn.  Gastrointestinal (Lower):   Patient denies diarrhea and constipation.  Constitutional:   Patient denies fever, night sweats, weight loss, and fatigue.  Skin:   Patient denies skin rash/ lesion and itching.  Eyes:   Patient denies blurred vision and double vision.  Ears/ Nose/ Throat:   Patient denies sore throat and sinus problems.  Hematologic/Lymphatic:   Patient denies swollen glands and easy bruising.  Cardiovascular:   Patient denies leg swelling and chest pains.  Respiratory:  Patient denies cough and shortness of breath.  Endocrine:   Patient denies excessive thirst.  Musculoskeletal:   Patient denies back pain and joint pain.  Neurological:   Patient denies headaches and dizziness.  Psychologic:   Patient denies depression and anxiety.   VITAL SIGNS:      04/21/2023 12:04 PM  Weight 154 lb / 69.85 kg  Height 67 in / 170.18 cm  BP 143/74 mmHg  Pulse 57 /min   Temperature 98.2 F / 36.7 C  BMI 24.1 kg/m   MULTI-SYSTEM PHYSICAL EXAMINATION:    Constitutional: Well-nourished. No physical deformities. Normally developed. Good grooming.  Neck: Neck symmetrical, not swollen. Normal tracheal position.  Respiratory: No labored breathing, no use of accessory muscles.   Cardiovascular: Normal temperature, normal extremity pulses, no swelling, no varicosities.  Skin: No paleness, no jaundice, no cyanosis. No lesion, no ulcer, no rash.  Neurologic / Psychiatric: Oriented to time, oriented to place, oriented to person. No depression, no anxiety, no agitation.  Gastrointestinal: No mass, no tenderness, no rigidity, non obese abdomen.     Complexity of Data:  Lab Test Review:   PSA, BUN/Creatinine  Records Review:   AUA Symptom Score  Notes:                     PSA 1.2 in Jul 2024 with cr 0.98.    PROCEDURES:          Visit Complexity - G2211 Chronic management         Urinalysis Dipstick Dipstick Cont'd  Color: Yellow Bilirubin: Neg mg/dL  Appearance: Clear Ketones: Neg mg/dL  Specific Gravity: 8.295 Blood: Neg ery/uL  pH: 5.5 Protein: Trace mg/dL  Glucose: Neg mg/dL Urobilinogen: 0.2 mg/dL    Nitrites: Neg    Leukocyte Esterase: Neg leu/uL    ASSESSMENT:      ICD-10 Details  1 GU:   BPH w/LUTS - N40.1 Chronic, Stable - I discussed with the patient the nature r/b/a to laser vaporization of prostate including side effects of the procedure, expected post-op course and likelihood of success. We discussed flow symptoms and irritative symptoms typically improve, but frequency and urgency can persist and rarely worsen. We also discussed risk of bleeding, infection, stricture, sexual dysfunction and incontinence among others. We also discussed expectations for a staged or repeat procedure in the future. All questions answered. He elects to proceed.  Disc again noc may not improve.   2   Weak Urinary Stream - R39.12 Chronic, Stable - tadalafil refilled     PLAN:            Medications Refill Meds: Tadalafil 5 mg tablet 1 tablet PO Daily   #90  3 Refill(s)            Schedule Return Visit/Planned Activity: Next Available Appointment - Schedule Surgery          Document Letter(s):  Created for Patient: Clinical Summary         Notes:   cc: Dr. Paulino Rily         Next Appointment:      Next Appointment: 05/01/2023 09:45 AM    Appointment Type: Postoperative Appointment    Location: [facility] [phone]    Provider: Anne Fu, NP    Reason for Visit: post op      * Signed by Jerilee Field, M.D. on 04/22/23 at 11:02 AM (EDT)*      The information contained in this medical record document  is considered private and confidential patient information. This information can only be used for the medical diagnosis and/or medical services that are being provided by the patient's selected caregivers. This information can only be distributed outside of the patient's care if the patient agrees and signs waivers of authorization for this information to be sent to an outside source or route.

## 2023-04-24 NOTE — Anesthesia Preprocedure Evaluation (Addendum)
Anesthesia Evaluation  Patient identified by MRN, date of birth, ID band Patient awake    Reviewed: Allergy & Precautions, NPO status , Patient's Chart, lab work & pertinent test results  Airway Mallampati: II  TM Distance: >3 FB Neck ROM: Full    Dental no notable dental hx. (+) Teeth Intact, Implants, Dental Advisory Given   Pulmonary asthma , former smoker   Pulmonary exam normal breath sounds clear to auscultation       Cardiovascular hypertension, Pt. on medications Normal cardiovascular exam+ Valvular Problems/Murmurs  Rhythm:Regular Rate:Normal  Echo 04/05/20 1. Left ventricular ejection fraction, by estimation, is 60 to 65%. The  left ventricle has normal function. The left ventricle has no regional  wall motion abnormalities. There is mild left ventricular hypertrophy.  Left ventricular diastolic parameters  are indeterminate.   2. Right ventricular systolic function is normal. The right ventricular  size is normal. There is normal pulmonary artery systolic pressure.   3. The mitral valve is normal in structure. Trivial mitral valve  regurgitation. No evidence of mitral stenosis.   4. The aortic valve is normal in structure. Aortic valve regurgitation is  not visualized. No aortic stenosis is present.   5. The inferior vena cava is normal in size with greater than 50%  respiratory variability, suggesting right atrial pressure of 3 mmHg.   EKG 06/05/22 NSR, biatrial enlargement, repol abn unchanged from prev tracing     Neuro/Psych  PSYCHIATRIC DISORDERS Anxiety Depression    Right CTS  Neuromuscular disease    GI/Hepatic Neg liver ROS,GERD  Medicated,,  Endo/Other  Hyperlipidemia Gout  Renal/GU negative Renal ROSLab Results      Component                Value               Date                      NA                       144                 04/25/2023                CL                       103                  04/25/2023                K                        4.5                 04/25/2023                     BUN                      18                  04/25/2023                CREATININE               0.90  04/25/2023                 negative genitourinary   Musculoskeletal  (+) Arthritis , Osteoarthritis,  Hx/o cervical stenosis S/P fusion   Abdominal   Peds  Hematology negative hematology ROS (+) Lab Results      Component                Value               Date                          HGB                      15.3                04/25/2023                HCT                      45.0                04/25/2023               Anesthesia Other Findings All: See list  Reproductive/Obstetrics ED                             Anesthesia Physical Anesthesia Plan  ASA: 2  Anesthesia Plan: General   Post-op Pain Management: Tylenol PO (pre-op)*   Induction: Intravenous  PONV Risk Score and Plan: 2 and Treatment may vary due to age or medical condition, Ondansetron and Midazolam  Airway Management Planned: LMA  Additional Equipment: None  Intra-op Plan:   Post-operative Plan:   Informed Consent: I have reviewed the patients History and Physical, chart, labs and discussed the procedure including the risks, benefits and alternatives for the proposed anesthesia with the patient or authorized representative who has indicated his/her understanding and acceptance.     Dental advisory given  Plan Discussed with: CRNA and Anesthesiologist  Anesthesia Plan Comments:        Anesthesia Quick Evaluation

## 2023-04-25 ENCOUNTER — Ambulatory Visit (HOSPITAL_BASED_OUTPATIENT_CLINIC_OR_DEPARTMENT_OTHER): Payer: Medicare Other | Admitting: Anesthesiology

## 2023-04-25 ENCOUNTER — Telehealth: Payer: Self-pay | Admitting: Orthopedic Surgery

## 2023-04-25 ENCOUNTER — Other Ambulatory Visit: Payer: Self-pay

## 2023-04-25 ENCOUNTER — Encounter (HOSPITAL_BASED_OUTPATIENT_CLINIC_OR_DEPARTMENT_OTHER): Payer: Self-pay | Admitting: Urology

## 2023-04-25 ENCOUNTER — Encounter (HOSPITAL_BASED_OUTPATIENT_CLINIC_OR_DEPARTMENT_OTHER)
Admission: RE | Disposition: A | Discharge status: Home / Self Care | Payer: Self-pay | Source: Home / Self Care | Attending: Urology

## 2023-04-25 ENCOUNTER — Ambulatory Visit (HOSPITAL_BASED_OUTPATIENT_CLINIC_OR_DEPARTMENT_OTHER)
Admission: RE | Admit: 2023-04-25 | Discharge: 2023-04-25 | Disposition: A | Discharge status: Home / Self Care | Payer: Medicare Other | Attending: Urology | Admitting: Urology

## 2023-04-25 DIAGNOSIS — N50812 Left testicular pain: Secondary | ICD-10-CM | POA: Insufficient documentation

## 2023-04-25 DIAGNOSIS — C61 Malignant neoplasm of prostate: Secondary | ICD-10-CM

## 2023-04-25 DIAGNOSIS — N401 Enlarged prostate with lower urinary tract symptoms: Secondary | ICD-10-CM | POA: Insufficient documentation

## 2023-04-25 DIAGNOSIS — I10 Essential (primary) hypertension: Secondary | ICD-10-CM | POA: Insufficient documentation

## 2023-04-25 DIAGNOSIS — K219 Gastro-esophageal reflux disease without esophagitis: Secondary | ICD-10-CM | POA: Insufficient documentation

## 2023-04-25 DIAGNOSIS — Z79899 Other long term (current) drug therapy: Secondary | ICD-10-CM | POA: Insufficient documentation

## 2023-04-25 DIAGNOSIS — R350 Frequency of micturition: Secondary | ICD-10-CM | POA: Insufficient documentation

## 2023-04-25 DIAGNOSIS — N138 Other obstructive and reflux uropathy: Secondary | ICD-10-CM

## 2023-04-25 DIAGNOSIS — Z01818 Encounter for other preprocedural examination: Secondary | ICD-10-CM

## 2023-04-25 DIAGNOSIS — R3912 Poor urinary stream: Secondary | ICD-10-CM | POA: Insufficient documentation

## 2023-04-25 HISTORY — PX: THULIUM LASER TURP (TRANSURETHRAL RESECTION OF PROSTATE): SHX6744

## 2023-04-25 HISTORY — DX: Claustrophobia: F40.240

## 2023-04-25 HISTORY — DX: Male erectile dysfunction, unspecified: N52.9

## 2023-04-25 HISTORY — DX: Diaphragmatic hernia without obstruction or gangrene: K44.9

## 2023-04-25 HISTORY — DX: Spinal stenosis, lumbar region without neurogenic claudication: M48.061

## 2023-04-25 HISTORY — DX: Personal history of other diseases of male genital organs: Z87.438

## 2023-04-25 HISTORY — DX: Radiculopathy, lumbar region: M54.16

## 2023-04-25 HISTORY — DX: Depression, unspecified: F32.A

## 2023-04-25 HISTORY — DX: Hyperlipidemia, unspecified: E78.5

## 2023-04-25 HISTORY — DX: Benign prostatic hyperplasia with lower urinary tract symptoms: N40.1

## 2023-04-25 HISTORY — DX: Cannabis use, unspecified, uncomplicated: F12.90

## 2023-04-25 HISTORY — DX: Personal history of other diseases of the musculoskeletal system and connective tissue: Z87.39

## 2023-04-25 HISTORY — DX: Presence of spectacles and contact lenses: Z97.3

## 2023-04-25 LAB — POCT I-STAT, CHEM 8
BUN: 18 mg/dL (ref 8–23)
Calcium, Ion: 1.18 mmol/L (ref 1.15–1.40)
Chloride: 103 mmol/L (ref 98–111)
Creatinine, Ser: 0.9 mg/dL (ref 0.61–1.24)
Glucose, Bld: 92 mg/dL (ref 70–99)
HCT: 45 % (ref 39.0–52.0)
Hemoglobin: 15.3 g/dL (ref 13.0–17.0)
Potassium: 4.5 mmol/L (ref 3.5–5.1)
Sodium: 144 mmol/L (ref 135–145)
TCO2: 29 mmol/L (ref 22–32)

## 2023-04-25 SURGERY — THULIUM LASER TURP (TRANSURETHRAL RESECTION OF PROSTATE)
Anesthesia: General | Site: Prostate

## 2023-04-25 MED ORDER — FENTANYL CITRATE (PF) 100 MCG/2ML IJ SOLN
INTRAMUSCULAR | Status: DC | PRN
  Administered 2023-04-25: 50 ug via INTRAVENOUS
  Administered 2023-04-25 (×2): 25 ug via INTRAVENOUS

## 2023-04-25 MED ORDER — ACETAMINOPHEN 10 MG/ML IV SOLN: 1000.0000 mg | Freq: Once | INTRAVENOUS | Status: DC | PRN

## 2023-04-25 MED ORDER — LIDOCAINE HCL (CARDIAC) PF 100 MG/5ML IV SOSY
PREFILLED_SYRINGE | INTRAVENOUS | Status: DC | PRN
  Administered 2023-04-25: 50 mg via INTRAVENOUS

## 2023-04-25 MED ORDER — DEXAMETHASONE SODIUM PHOSPHATE 4 MG/ML IJ SOLN
INTRAMUSCULAR | Status: DC | PRN
  Administered 2023-04-25: 8 mg via INTRAVENOUS

## 2023-04-25 MED ORDER — ONDANSETRON HCL 4 MG/2ML IJ SOLN: 4.0000 mg | Freq: Once | INTRAMUSCULAR | Status: DC | PRN

## 2023-04-25 MED ORDER — FENTANYL CITRATE (PF) 100 MCG/2ML IJ SOLN: 25.0000 ug | INTRAMUSCULAR | Status: DC | PRN

## 2023-04-25 MED ORDER — 0.9 % SODIUM CHLORIDE (POUR BTL) OPTIME
TOPICAL | Status: DC | PRN
Start: 2023-04-25 — End: 2023-04-25
  Administered 2023-04-25: 500 mL

## 2023-04-25 MED ORDER — FENTANYL CITRATE (PF) 100 MCG/2ML IJ SOLN
INTRAMUSCULAR | Status: AC
  Filled 2023-04-25: qty 2

## 2023-04-25 MED ORDER — ACETAMINOPHEN 500 MG PO TABS
ORAL_TABLET | ORAL | Status: AC
  Filled 2023-04-25: qty 2

## 2023-04-25 MED ORDER — MIDAZOLAM HCL 2 MG/2ML IJ SOLN
INTRAMUSCULAR | Status: DC | PRN
  Administered 2023-04-25: 2 mg via INTRAVENOUS

## 2023-04-25 MED ORDER — SODIUM CHLORIDE 0.9 % IR SOLN
Status: DC | PRN
  Administered 2023-04-25: 6000 mL
  Administered 2023-04-25: 1000 mL

## 2023-04-25 MED ORDER — LACTATED RINGERS IV SOLN: INTRAVENOUS | Status: DC

## 2023-04-25 MED ORDER — ONDANSETRON HCL 4 MG/2ML IJ SOLN
INTRAMUSCULAR | Status: DC | PRN
  Administered 2023-04-25: 4 mg via INTRAVENOUS

## 2023-04-25 MED ORDER — PROPOFOL 10 MG/ML IV BOLUS
INTRAVENOUS | Status: DC | PRN
  Administered 2023-04-25: 10 mg via INTRAVENOUS
  Administered 2023-04-25: 30 mg via INTRAVENOUS
  Administered 2023-04-25: 150 mg via INTRAVENOUS

## 2023-04-25 MED ORDER — DEXMEDETOMIDINE HCL IN NACL 80 MCG/20ML IV SOLN
INTRAVENOUS | Status: DC | PRN
  Administered 2023-04-25: 8 ug via INTRAVENOUS

## 2023-04-25 MED ORDER — OXYCODONE HCL 5 MG PO TABS
ORAL_TABLET | ORAL | Status: AC
  Filled 2023-04-25: qty 1

## 2023-04-25 MED ORDER — STERILE WATER FOR IRRIGATION IR SOLN
Status: DC | PRN
  Administered 2023-04-25: 500 mL

## 2023-04-25 MED ORDER — MIDAZOLAM HCL 2 MG/2ML IJ SOLN
INTRAMUSCULAR | Status: AC
  Filled 2023-04-25: qty 2

## 2023-04-25 MED ORDER — CIPROFLOXACIN IN D5W 400 MG/200ML IV SOLN
INTRAVENOUS | Status: AC
  Filled 2023-04-25: qty 200

## 2023-04-25 MED ORDER — CIPROFLOXACIN IN D5W 400 MG/200ML IV SOLN
400.0000 mg | Freq: Two times a day (BID) | INTRAVENOUS | Status: DC
  Administered 2023-04-25: 400 mg via INTRAVENOUS

## 2023-04-25 MED ORDER — NITROFURANTOIN MONOHYD MACRO 100 MG PO CAPS: 100.0000 mg | ORAL_CAPSULE | Freq: Every day | ORAL | 0 refills | Status: AC

## 2023-04-25 MED ORDER — OXYCODONE HCL 5 MG PO TABS
5.0000 mg | ORAL_TABLET | Freq: Once | ORAL | Status: AC
  Administered 2023-04-25: 5 mg via ORAL

## 2023-04-25 MED ORDER — ACETAMINOPHEN 500 MG PO TABS
1000.0000 mg | ORAL_TABLET | Freq: Once | ORAL | Status: AC
  Administered 2023-04-25: 1000 mg via ORAL

## 2023-04-25 SURGICAL SUPPLY — 25 items
BAG DRAIN URO-CYSTO SKYTR STRL (DRAIN) ×2 IMPLANT
BAG DRN RND TRDRP ANRFLXCHMBR (UROLOGICAL SUPPLIES) ×1
BAG DRN UROCATH (DRAIN) ×1
BAG URINE DRAIN 2000ML AR STRL (UROLOGICAL SUPPLIES) ×2 IMPLANT
CATH FOLEY 2WAY SLVR 5CC 20FR (CATHETERS) IMPLANT
CLOTH BEACON ORANGE TIMEOUT ST (SAFETY) ×2 IMPLANT
FEE LASER CYBER TM PROCEDURE (MISCELLANEOUS) IMPLANT
GLOVE BIO SURGEON STRL SZ7.5 (GLOVE) ×2 IMPLANT
GLOVE BIOGEL PI IND STRL 6 (GLOVE) IMPLANT
GOWN STRL REUS W/TWL LRG LVL3 (GOWN DISPOSABLE) ×2 IMPLANT
HIBICLENS CHG 4% 4OZ BTL (MISCELLANEOUS) IMPLANT
IV NS IRRIG 3000ML ARTHROMATIC (IV SOLUTION) ×2 IMPLANT
KIT TURNOVER CYSTO (KITS) ×2 IMPLANT
LASER CYBER FIBER (MISCELLANEOUS) IMPLANT
LASER CYBER PROCEDURE (MISCELLANEOUS) ×1
MANIFOLD NEPTUNE II (INSTRUMENTS) IMPLANT
NS IRRIG 500ML POUR BTL (IV SOLUTION) IMPLANT
PACK CYSTO (CUSTOM PROCEDURE TRAY) ×2 IMPLANT
SLEEVE SCD COMPRESS KNEE MED (STOCKING) ×2 IMPLANT
SYR 30ML LL (SYRINGE) IMPLANT
SYR TOOMEY IRRIG 70ML (MISCELLANEOUS) ×1
SYRINGE TOOMEY IRRIG 70ML (MISCELLANEOUS) IMPLANT
TUBE CONNECTING 12X1/4 (SUCTIONS) IMPLANT
TUBING UROLOGY SET (TUBING) IMPLANT
WATER STERILE IRR 500ML POUR (IV SOLUTION) IMPLANT

## 2023-04-25 NOTE — Discharge Instructions (Signed)

## 2023-04-25 NOTE — Telephone Encounter (Signed)
Patient called needing a refill on pain medication. He just had surgery. ZO#[phone]

## 2023-04-25 NOTE — Telephone Encounter (Signed)
Tried calling pt back. He just had prostate sx. He needs to contact that doctor for pain medication

## 2023-04-25 NOTE — Telephone Encounter (Signed)
Advised pt

## 2023-04-25 NOTE — Interval H&P Note (Signed)
History and Physical Interval Note:  04/25/2023 9:02 AM  David Graves  has presented today for surgery, with the diagnosis of BENIGN PROSTATE HYPERPLASIA NOCTURIA.  The various methods of treatment have been discussed with the patient and family. After consideration of risks, benefits and other options for treatment, the patient has consented to  Procedure(s) with comments: THULIUM LASER TURP (TRANSURETHRAL RESECTION OF PROSTATE) (N/A) - 1 HR FOR CASE as a surgical intervention.  Discussed with him and his wife today the nature risk benefits and alternatives to thulium laser and we discussed risk of bladder or sexual dysfunction, stricture and bleeding among others.  We also discussed again expectations for flow versus storage voiding symptoms.  All questions answered.  He elects to proceed.  He has been well.  No cough, cold or congestion.  No fever.  No dysuria or gross hematuria.  The patient's history has been reviewed, patient examined, no change in status, stable for surgery.  I have reviewed the patient's chart and labs.  Questions were answered to their satisfaction.     Jerilee Field

## 2023-04-25 NOTE — Op Note (Signed)
Preoperative diagnosis: BPH with lower urinary tract symptoms Post diagnosis: Same  Procedure: Cystoscopy with thulium laser vaporization of the prostate  Surgeon: Mena Goes  Anesthesia: General  Indication for procedure: David Graves is a 67 year old male with a history of BPH and bothersome lower urinary tract symptoms.  He elected for surgical management.  Findings: On cystoscopy the prostate was obstructed by a median lobe and high bladder neck.  Short lateral lobes.  Large capacity bladder with mild trabeculation.  No stone or foreign body in the bladder.  No mucosal lesions.  Ureteral orifice ease were normal pre and post laser.  Normal orthotopic position.  On DRE prostate was about 40 g and smooth without hard area or nodule.  Glans and meatus appeared normal.  Penis circumcised with normal foreskin.  Description of procedure: After consent was obtained patient brought to the operating room.  After adequate anesthesia he was placed in lithotomy position and prepped and draped in the usual sterile fashion.  Timeout was performed to confirm the patient and procedure.  Cystoscope was passed per urethra and the bladder carefully inspected.  I then swapped that out for the continuous-flow laser sheath and scope.  1000 m laser fiber was advanced.  The ureteral orifice ease were identified and I made an incision at the patient's right and left side 7:00 and then 5:00 respectively brought that down through the bladder neck and to the verumontanum.  The median lobe was then vaporized.  The lateral lobes were then vaporized anterior to posterior bladder neck down to the verumontanum.  This created an excellent channel.  Hemostasis was excellent a low pressure.  The bladder and ureteral orifice ease were checked.  The scope was backed out.  A 20 [place] two-way catheter was advanced and left to gravity drainage.  Urine was clear.  Exam under anesthesia was performed.  He was awakened and taken to the cover  room in stable condition.  Complications: None  Blood loss: Minimal  Specimens: None  Drains: 20 French Foley catheter  Disposition: Patient stable to PACU.

## 2023-04-25 NOTE — Anesthesia Postprocedure Evaluation (Signed)
Anesthesia Post Note  Patient: David Graves  Procedure(s) Performed: Morton Peters LASER TURP (TRANSURETHRAL RESECTION OF PROSTATE) (Prostate)     Patient location during evaluation: PACU Anesthesia Type: General Level of consciousness: awake and alert Pain management: pain level controlled Vital Signs Assessment: post-procedure vital signs reviewed and stable Respiratory status: spontaneous breathing, nonlabored ventilation, respiratory function stable and patient connected to nasal cannula oxygen Cardiovascular status: blood pressure returned to baseline and stable Postop Assessment: no apparent nausea or vomiting Anesthetic complications: no   No notable events documented.  Last Vitals:  Vitals:   04/25/23 1045 04/25/23 1140  BP:  (!) 152/72  Pulse:  (!) 49  Resp:  14  Temp: 36.4 C (!) 36.2 C  SpO2:  100%    Last Pain:  Vitals:   04/25/23 1140  TempSrc:   PainSc: 0-No pain                 Trevor Iha

## 2023-04-25 NOTE — Transfer of Care (Signed)
Immediate Anesthesia Transfer of Care Note  Patient: David Graves  Procedure(s) Performed: Morton Peters LASER TURP (TRANSURETHRAL RESECTION OF PROSTATE) (Prostate)  Patient Location: PACU  Anesthesia Type:General  Level of Consciousness: awake, alert , oriented, and patient cooperative  Airway & Oxygen Therapy: Patient Spontanous Breathing and Patient connected to nasal cannula oxygen  Post-op Assessment: Report given to RN and Post -op Vital signs reviewed and stable  Post vital signs: Reviewed and stable  Last Vitals:  Vitals Value Taken Time  BP    Temp    Pulse 52 04/25/23 1017  Resp 15 04/25/23 1017  SpO2 99 % 04/25/23 1017  Vitals shown include unfiled device data.  Last Pain:  Vitals:   04/25/23 0714  TempSrc: Oral  PainSc: 0-No pain      Patients Stated Pain Goal: 6 (04/25/23 0714)  Complications: No notable events documented.

## 2023-04-25 NOTE — Anesthesia Procedure Notes (Signed)
Procedure Name: LMA Insertion Date/Time: 04/25/2023 9:30 AM  Performed by: Earmon Phoenix, CRNAPre-anesthesia Checklist: Patient identified, Emergency Drugs available, Suction available, Patient being monitored and Timeout performed Patient Re-evaluated:Patient Re-evaluated prior to induction Oxygen Delivery Method: Circle system utilized Preoxygenation: Pre-oxygenation with 100% oxygen Induction Type: IV induction Ventilation: Mask ventilation without difficulty LMA: LMA inserted LMA Size: 4.0 Number of attempts: 1 Placement Confirmation: positive ETCO2 and breath sounds checked- equal and bilateral Tube secured with: Tape Dental Injury: Teeth and Oropharynx as per pre-operative assessment

## 2023-04-27 HISTORY — PX: PROSTATE SURGERY: SHX751

## 2023-04-29 ENCOUNTER — Encounter (HOSPITAL_BASED_OUTPATIENT_CLINIC_OR_DEPARTMENT_OTHER): Payer: Self-pay | Admitting: Urology

## 2023-05-05 ENCOUNTER — Ambulatory Visit: Payer: Medicare Other | Admitting: Orthopedic Surgery

## 2023-05-14 DIAGNOSIS — M5416 Radiculopathy, lumbar region: Secondary | ICD-10-CM | POA: Diagnosis not present

## 2023-05-14 DIAGNOSIS — G5603 Carpal tunnel syndrome, bilateral upper limbs: Secondary | ICD-10-CM | POA: Diagnosis not present

## 2023-05-14 DIAGNOSIS — Z79899 Other long term (current) drug therapy: Secondary | ICD-10-CM | POA: Diagnosis not present

## 2023-05-14 DIAGNOSIS — Q761 Klippel-Feil syndrome: Secondary | ICD-10-CM | POA: Diagnosis not present

## 2023-05-14 DIAGNOSIS — M25511 Pain in right shoulder: Secondary | ICD-10-CM | POA: Diagnosis not present

## 2023-05-14 DIAGNOSIS — M129 Arthropathy, unspecified: Secondary | ICD-10-CM | POA: Diagnosis not present

## 2023-05-16 DIAGNOSIS — Z79899 Other long term (current) drug therapy: Secondary | ICD-10-CM | POA: Diagnosis not present

## 2023-05-20 DIAGNOSIS — Z79899 Other long term (current) drug therapy: Secondary | ICD-10-CM | POA: Diagnosis not present

## 2023-05-20 DIAGNOSIS — Q761 Klippel-Feil syndrome: Secondary | ICD-10-CM | POA: Diagnosis not present

## 2023-05-20 DIAGNOSIS — G5603 Carpal tunnel syndrome, bilateral upper limbs: Secondary | ICD-10-CM | POA: Diagnosis not present

## 2023-05-20 DIAGNOSIS — M5416 Radiculopathy, lumbar region: Secondary | ICD-10-CM | POA: Diagnosis not present

## 2023-05-23 DIAGNOSIS — Z79899 Other long term (current) drug therapy: Secondary | ICD-10-CM | POA: Diagnosis not present

## 2023-05-29 DIAGNOSIS — Z713 Dietary counseling and surveillance: Secondary | ICD-10-CM | POA: Diagnosis not present

## 2023-05-29 DIAGNOSIS — K2950 Unspecified chronic gastritis without bleeding: Secondary | ICD-10-CM | POA: Diagnosis not present

## 2023-05-29 DIAGNOSIS — E7800 Pure hypercholesterolemia, unspecified: Secondary | ICD-10-CM | POA: Diagnosis not present

## 2023-05-29 DIAGNOSIS — I119 Hypertensive heart disease without heart failure: Secondary | ICD-10-CM | POA: Diagnosis not present

## 2023-06-12 ENCOUNTER — Telehealth: Payer: Self-pay | Admitting: Orthopaedic Surgery

## 2023-06-12 DIAGNOSIS — R3912 Poor urinary stream: Secondary | ICD-10-CM | POA: Diagnosis not present

## 2023-06-12 NOTE — Telephone Encounter (Signed)
Received. To [place].

## 2023-06-12 NOTE — Telephone Encounter (Signed)
Central radiology scheduling needs an updated physical report for pt being he is getting General anesthesia for MRI Lumbar they will Fax over a form to be filled out and faxed back

## 2023-06-20 DIAGNOSIS — Q761 Klippel-Feil syndrome: Secondary | ICD-10-CM | POA: Diagnosis not present

## 2023-06-20 DIAGNOSIS — M5416 Radiculopathy, lumbar region: Secondary | ICD-10-CM | POA: Diagnosis not present

## 2023-06-20 DIAGNOSIS — G5603 Carpal tunnel syndrome, bilateral upper limbs: Secondary | ICD-10-CM | POA: Diagnosis not present

## 2023-06-20 DIAGNOSIS — Z79899 Other long term (current) drug therapy: Secondary | ICD-10-CM | POA: Diagnosis not present

## 2023-06-24 ENCOUNTER — Other Ambulatory Visit: Payer: Self-pay

## 2023-06-24 ENCOUNTER — Encounter (HOSPITAL_COMMUNITY): Payer: Self-pay | Admitting: *Deleted

## 2023-06-24 NOTE — Progress Notes (Signed)
PCP - Dr Mila Palmer Cardiologist - none  Chest x-ray - n/a EKG - DOS Stress Test - 07/18/20 ECHO - 04/05/20 Cardiac Cath - n/a  ICD Pacemaker/Loop - n/a  Sleep Study -  n/a  Diabetes - n/a  NPO  Anesthesia review: Yes  STOP now taking any Aspirin (unless otherwise instructed by your surgeon), Aleve, Naproxen, Ibuprofen, Motrin, Advil, Goody's, BC's, all herbal medications, fish oil, and all vitamins.   Coronavirus Screening Do you have any of the following symptoms:  Cough yes/no: No Fever (>100.63F)  yes/no: No Runny nose yes/no: No Sore throat yes/no: No Difficulty breathing/shortness of breath  yes/no: No  Have you traveled in the last 14 days and where? yes/no: No  Patient verbalized understanding of instructions that were given via phone.

## 2023-06-25 DIAGNOSIS — Z79899 Other long term (current) drug therapy: Secondary | ICD-10-CM | POA: Diagnosis not present

## 2023-06-25 NOTE — Anesthesia Preprocedure Evaluation (Signed)
Anesthesia Evaluation  Patient identified by MRN, date of birth, ID band Patient awake    Reviewed: Allergy & Precautions, H&P , NPO status , Patient's Chart, lab work & pertinent test results  Airway Mallampati: II  TM Distance: >3 FB Neck ROM: Full    Dental no notable dental hx.    Pulmonary asthma , Patient abstained from smoking., former smoker   Pulmonary exam normal breath sounds clear to auscultation       Cardiovascular hypertension, Normal cardiovascular exam Rhythm:Regular Rate:Normal  1. Left ventricular ejection fraction, by estimation, is 60 to 65%. The  left ventricle has normal function. The left ventricle has no regional  wall motion abnormalities. There is mild left ventricular hypertrophy.  Left ventricular diastolic parameters  are indeterminate.   2. Right ventricular systolic function is normal. The right ventricular  size is normal. There is normal pulmonary artery systolic pressure.   3. The mitral valve is normal in structure. Trivial mitral valve  regurgitation. No evidence of mitral stenosis.   4. The aortic valve is normal in structure. Aortic valve regurgitation is  not visualized. No aortic stenosis is present.   5. The inferior vena cava is normal in size with greater than 50%  respiratory variability, suggesting right atrial pressure of 3 mmHg.     Neuro/Psych  PSYCHIATRIC DISORDERS Anxiety Depression     Neuromuscular disease    GI/Hepatic Neg liver ROS, hiatal hernia,GERD  ,,  Endo/Other  negative endocrine ROS    Renal/GU negative Renal ROS  negative genitourinary   Musculoskeletal  (+) Arthritis ,    Abdominal   Peds negative pediatric ROS (+)  Hematology negative hematology ROS (+)   Anesthesia Other Findings   Reproductive/Obstetrics negative OB ROS                              Anesthesia Physical Anesthesia Plan  ASA: 3  Anesthesia Plan:  General   Post-op Pain Management:    Induction: Intravenous  PONV Risk Score and Plan: Ondansetron and Dexamethasone  Airway Management Planned: Oral ETT  Additional Equipment:   Intra-op Plan:   Post-operative Plan: Extubation in OR  Informed Consent: I have reviewed the patients History and Physical, chart, labs and discussed the procedure including the risks, benefits and alternatives for the proposed anesthesia with the patient or authorized representative who has indicated his/her understanding and acceptance.     Dental advisory given  Plan Discussed with: CRNA  Anesthesia Plan Comments: (PAT note written 06/25/2023 by Shonna Chock, PA-C.  Patient is a 67 year old male scheduled for the above procedure. Dr. Ophelia Charter signed a H&P form dated 06/12/23 (scanned under Media tab).    History includes former tobacco smoker (quit 05/16/12), + THC use, HTN, GERD, hiatal hernia, insomnia, hypercholesterolemia, anxiety, asthma, spinal surgery (C4-7 ACDF 08/06/21), BPH (s/p TURP 04/25/23), claustrophobia.     Patient evaluated by cardiologist Dr. Anne Fu on 05/17/20 for chest burning, non-exertional. Insurance did not approve a coronary CTA. Nuclear stress test was discussed but he reported being "too claustrophobic to have the imaging", so an ETT was ordered and done on 07/18/20. Results showed mild ST elevation (< 1mm) at peak exercise felt most consistent with J point elevation, given hypertensive response (BP >230 at exercise) could not determine if this was due to ischemia or elevated BP. Dr. Anne Fu personally reviewed treadmill test and felt results were "Overall reassuring. Continue with hypertension control. No further cardiac  work-up at this time."   Same-day workup.  Last EKG done just over a year ago.  Labs and and EKG on arrival as indicated.  Anesthesia team to evaluate on the day of procedure.   )        Anesthesia Quick Evaluation

## 2023-06-25 NOTE — Progress Notes (Signed)
Anesthesia Chart Review: David Graves  Case: [id number] Date/Time: 06/26/23 0745   Procedure: MRI WITH ANESTHESIA OF LUMBAR SPINE WITHOUT CONTRAST   Anesthesia type: General   Pre-op diagnosis: SPINAL STENOSIS OF LUMBAR REGION WITH NEUROGENIC CLAUDICATION, LOW BACK PAIN,   [facility]   Surgeons: Radiologist, Medication, MD       DISCUSSION: Patient is a 67 year old male scheduled for the above procedure. Dr. Ophelia Charter signed a H&P form dated 06/12/23 (scanned under Media tab).    History includes former tobacco smoker (quit 05/16/12), + THC use, HTN, GERD, hiatal hernia, insomnia, hypercholesterolemia, anxiety, asthma, spinal surgery (C4-7 ACDF 08/06/21), BPH (s/p TURP 04/25/23), claustrophobia.     Patient evaluated by cardiologist Dr. Anne Fu on 05/17/20 for chest burning, non-exertional. Insurance did not approve a coronary CTA. Nuclear stress test was discussed but he reported being "too claustrophobic to have the imaging", so an ETT was ordered and done on 07/18/20. Results showed mild ST elevation (< 1mm) at peak exercise felt most consistent with J point elevation, given hypertensive response (BP >230 at exercise) could not determine if this was due to ischemia or elevated BP. Dr. Anne Fu personally reviewed treadmill test and felt results were "Overall reassuring. Continue with hypertension control. No further cardiac work-up at this time."   Same-day workup.  Last EKG done just over a year ago.  Labs and and EKG on arrival as indicated.  Anesthesia team to evaluate on the day of procedure.    VS: Ht 5\' 7"  (1.702 m)   Wt 70.8 kg   BMI 24.43 kg/m  BP Readings from Last 3 Encounters:  04/25/23 (!) 152/72  03/17/23 (!) 171/66  02/25/23 (!) 154/82   Pulse Readings from Last 3 Encounters:  04/25/23 (!) 49  03/17/23 (!) 42  02/25/23 63    PROVIDERS: Mila Palmer, MD is PCP  Donato Schultz, MD is cardiologist, evaluation in 2021. See DISCUSSION.  Jerilee Field, MD is urologist Willis Modena, MD is GI   LABS: For day of procedure as indicated. As of 04/25/23, glucose 92, Cr 0.90, H/H 15.2/45.0.    EKG: Last EKG noted is just over 1 year ago, so anticipate repeat on the day of procedure based on history. EKG from 06/06/23 showed: SR, biatrial enlargement, diffused repolarization abnormalities   CV: Echo 04/05/20: IMPRESSIONS   1. Left ventricular ejection fraction, by estimation, is 60 to 65%. The  left ventricle has normal function. The left ventricle has no regional  wall motion abnormalities. There is mild left ventricular hypertrophy.  Left ventricular diastolic parameters  are indeterminate.   2. Right ventricular systolic function is normal. The right ventricular  size is normal. There is normal pulmonary artery systolic pressure.   3. The mitral valve is normal in structure. Trivial mitral valve  regurgitation. No evidence of mitral stenosis.   4. The aortic valve is normal in structure. Aortic valve regurgitation is  not visualized. No aortic stenosis is present.   5. The inferior vena cava is normal in size with greater than 50%  respiratory variability, suggesting right atrial pressure of 3 mmHg.      ETT 07/18/20: Blood pressure demonstrated a hypertensive response to exercise. ST segment elevation was noted during stress in the V1 leads.   There is mild ST elevation in V1 (<1 mm) at peak exercise and extending throughout recovery. Shape is most consistent with J point elevation. There is nonspecific ST upsloping depression at peak exercise/early recovery,  otherwise no significant abnormalities. Given hypertensive response (BP >230 at exercise) cannot determine if this is ischemia or due to elevated blood pressure. - Dr. Anne Fu reviewed on 07/18/20 and wrote, "Personally reviewed treadmill test.  Overall reassuring. Continue with hypertension control. No further cardiac work-up at this time."  Past Medical History:   Diagnosis Date   Anxiety    Asthma    followed by pcp   (04-22-2023  per pt symbicort inhaler prn,  last used approx few months ago)   BPH associated with nocturia    urologist--- dr Mena Goes   Chronic insomnia    Claustrophobia    Depression    ED (erectile dysfunction)    Family history of prostate cancer    GERD (gastroesophageal reflux disease)    Hiatal hernia    History of epididymitis    History of gout    04-22-2023  per pt last episode approx 2019 great toe   Hyperlipidemia    Hypertension    evaluated by cardiology for chest pain -- dr Anne Fu-- note in epic 05-17-2020,  unable to to [facility] due to claustrophobia; 07-18-2020 ETT done personally reviewed by Dr Anne Fu "overall reassuring, continue HTN control, no further cardiac work-up at this time   Lateral epicondylitis of right elbow    Lumbar radiculopathy    Lumbar spinal stenosis    Marijuana use, continuous    04-22-2023  per pt smokes 1-2 daily   Seasonal allergic rhinitis due to pollen    Wears glasses     Past Surgical History:  Procedure Laterality Date   ANTERIOR CERVICAL DECOMP/DISCECTOMY FUSION N/A 08/06/2021   Procedure: ANTERIOR CERVICAL DISCECTOMY FUSION C4-5, C5-6, C6-7, ALLOGRAFT, PLATE;  Surgeon: [name], MD;  Location: [facility];  Service: Orthopedics;  Laterality: N/A;   CARPAL TUNNEL RELEASE Left 07/01/2022   Procedure: LEFT CARPAL TUNNEL RELEASE;  Surgeon: [name], MD;  Location: [facility];  Service: Orthopedics;  Laterality: Left;   CARPAL TUNNEL RELEASE Right 03/17/2023   Procedure: RIGHT CARPAL TUNNEL RELEASE;  Surgeon: [name], MD;  Location: [facility];  Service: Orthopedics;  Laterality: Right;   COLONOSCOPY  10/20/2017   PROSTATE SURGERY  04/2023   blockage in prostate   RADIOLOGY WITH ANESTHESIA N/A 10/17/2020   Procedure: MRI WITH ANESTHESIA  LUMBAR WITHOUT CONTRAST AND CERVICAL WITHOUT CONTRAST;  Surgeon: Radiologist, Medication, MD;   Location: [facility];  Service: Radiology;  Laterality: N/A;   THULIUM LASER TURP (TRANSURETHRAL RESECTION OF PROSTATE) N/A 04/25/2023   Procedure: THULIUM LASER TURP (TRANSURETHRAL RESECTION OF PROSTATE);  Surgeon: Jerilee Field, MD;  Location: [facility];  Service: Urology;  Laterality: N/A;   UPPER GI ENDOSCOPY  2020    MEDICATIONS: No current facility-administered medications for this encounter.    acetaminophen (TYLENOL) 500 MG tablet   amLODipine (NORVASC) 10 MG tablet   atorvastatin (LIPITOR) 20 MG tablet   cetirizine (ZYRTEC) 10 MG tablet   EPINEPHrine 0.3 mg/0.3 mL IJ SOAJ injection   oxyCODONE-acetaminophen (PERCOCET/ROXICET) 5-325 MG tablet   pantoprazole (PROTONIX) 40 MG tablet   Simethicone (GAS-X PO)    Shonna Chock, PA-C Surgical Short Stay/Anesthesiology [facility] Phone [phone] [facility] Phone [phone] 06/25/2023 10:48 AM

## 2023-06-26 ENCOUNTER — Ambulatory Visit (HOSPITAL_COMMUNITY)
Admission: RE | Admit: 2023-06-26 | Discharge: 2023-06-26 | Disposition: A | Discharge status: Home / Self Care | Payer: Medicare Other | Source: Ambulatory Visit | Attending: Orthopaedic Surgery | Admitting: Orthopaedic Surgery

## 2023-06-26 ENCOUNTER — Encounter (HOSPITAL_COMMUNITY)
Admission: RE | Disposition: A | Discharge status: Home / Self Care | Payer: Self-pay | Source: Ambulatory Visit | Attending: Orthopaedic Surgery

## 2023-06-26 ENCOUNTER — Ambulatory Visit (HOSPITAL_COMMUNITY): Payer: Medicare Other | Admitting: Vascular Surgery

## 2023-06-26 ENCOUNTER — Ambulatory Visit (HOSPITAL_BASED_OUTPATIENT_CLINIC_OR_DEPARTMENT_OTHER): Payer: Medicare Other | Admitting: Vascular Surgery

## 2023-06-26 ENCOUNTER — Encounter (HOSPITAL_COMMUNITY): Payer: Self-pay | Admitting: Orthopaedic Surgery

## 2023-06-26 ENCOUNTER — Encounter: Payer: Self-pay | Admitting: Radiology

## 2023-06-26 ENCOUNTER — Other Ambulatory Visit: Payer: Self-pay

## 2023-06-26 ENCOUNTER — Other Ambulatory Visit: Payer: Self-pay | Admitting: Radiology

## 2023-06-26 ENCOUNTER — Other Ambulatory Visit: Payer: Self-pay | Admitting: Orthopaedic Surgery

## 2023-06-26 DIAGNOSIS — M48062 Spinal stenosis, lumbar region with neurogenic claudication: Secondary | ICD-10-CM | POA: Diagnosis not present

## 2023-06-26 DIAGNOSIS — J45909 Unspecified asthma, uncomplicated: Secondary | ICD-10-CM | POA: Insufficient documentation

## 2023-06-26 DIAGNOSIS — M5126 Other intervertebral disc displacement, lumbar region: Secondary | ICD-10-CM | POA: Diagnosis not present

## 2023-06-26 DIAGNOSIS — K219 Gastro-esophageal reflux disease without esophagitis: Secondary | ICD-10-CM | POA: Insufficient documentation

## 2023-06-26 DIAGNOSIS — M4807 Spinal stenosis, lumbosacral region: Secondary | ICD-10-CM

## 2023-06-26 DIAGNOSIS — E785 Hyperlipidemia, unspecified: Secondary | ICD-10-CM | POA: Diagnosis not present

## 2023-06-26 DIAGNOSIS — Z87891 Personal history of nicotine dependence: Secondary | ICD-10-CM | POA: Diagnosis not present

## 2023-06-26 DIAGNOSIS — M47816 Spondylosis without myelopathy or radiculopathy, lumbar region: Secondary | ICD-10-CM | POA: Diagnosis not present

## 2023-06-26 DIAGNOSIS — M5137 Other intervertebral disc degeneration, lumbosacral region with discogenic back pain only: Secondary | ICD-10-CM | POA: Diagnosis not present

## 2023-06-26 DIAGNOSIS — I10 Essential (primary) hypertension: Secondary | ICD-10-CM | POA: Diagnosis not present

## 2023-06-26 DIAGNOSIS — M48061 Spinal stenosis, lumbar region without neurogenic claudication: Secondary | ICD-10-CM | POA: Diagnosis not present

## 2023-06-26 HISTORY — PX: RADIOLOGY WITH ANESTHESIA: SHX6223

## 2023-06-26 LAB — POCT I-STAT, CHEM 8
BUN: 17 mg/dL (ref 8–23)
BUN: 12 mg/dL (ref 8–23)
Calcium, Ion: 0.8 mmol/L — CL (ref 1.15–1.40)
Calcium, Ion: 1.16 mmol/L (ref 1.15–1.40)
Chloride: 111 mmol/L (ref 98–111)
Chloride: 106 mmol/L (ref 98–111)
Creatinine, Ser: 0.7 mg/dL (ref 0.61–1.24)
Creatinine, Ser: 0.7 mg/dL (ref 0.61–1.24)
Glucose, Bld: 87 mg/dL (ref 70–99)
Glucose, Bld: 88 mg/dL (ref 70–99)
HCT: 47 % (ref 39.0–52.0)
HCT: 48 % (ref 39.0–52.0)
Hemoglobin: 16 g/dL (ref 13.0–17.0)
Hemoglobin: 16.3 g/dL (ref 13.0–17.0)
Potassium: 7.5 mmol/L (ref 3.5–5.1)
Potassium: 3.4 mmol/L — ABNORMAL LOW (ref 3.5–5.1)
Sodium: 136 mmol/L (ref 135–145)
Sodium: 144 mmol/L (ref 135–145)
TCO2: 24 mmol/L (ref 22–32)
TCO2: 24 mmol/L (ref 22–32)

## 2023-06-26 SURGERY — MRI WITH ANESTHESIA
Anesthesia: General

## 2023-06-26 MED ORDER — ORAL CARE MOUTH RINSE: 15.0000 mL | Freq: Once | OROMUCOSAL | Status: AC

## 2023-06-26 MED ORDER — FENTANYL CITRATE (PF) 100 MCG/2ML IJ SOLN
25.0000 ug | INTRAMUSCULAR | Status: DC | PRN
Start: 2023-06-26 — End: 2023-06-26

## 2023-06-26 MED ORDER — MIDAZOLAM HCL 2 MG/2ML IJ SOLN
INTRAMUSCULAR | Status: DC | PRN
  Administered 2023-06-26: 2 mg via INTRAVENOUS

## 2023-06-26 MED ORDER — OXYCODONE HCL 5 MG/5ML PO SOLN: 5.0000 mg | Freq: Once | ORAL | Status: DC | PRN

## 2023-06-26 MED ORDER — GLYCOPYRROLATE 0.2 MG/ML IJ SOLN
INTRAMUSCULAR | Status: AC
  Filled 2023-06-26: qty 1

## 2023-06-26 MED ORDER — LIDOCAINE 2% (20 MG/ML) 5 ML SYRINGE
INTRAMUSCULAR | Status: DC | PRN
  Administered 2023-06-26: 80 mg via INTRAVENOUS

## 2023-06-26 MED ORDER — DROPERIDOL 2.5 MG/ML IJ SOLN: 0.6250 mg | Freq: Once | INTRAMUSCULAR | Status: DC | PRN

## 2023-06-26 MED ORDER — MIDAZOLAM HCL 2 MG/2ML IJ SOLN
INTRAMUSCULAR | Status: AC
  Filled 2023-06-26: qty 2

## 2023-06-26 MED ORDER — FENTANYL CITRATE (PF) 100 MCG/2ML IJ SOLN
INTRAMUSCULAR | Status: AC
  Filled 2023-06-26: qty 2

## 2023-06-26 MED ORDER — PHENYLEPHRINE HCL-NACL 20-0.9 MG/250ML-% IV SOLN
INTRAVENOUS | Status: DC | PRN
  Administered 2023-06-26: 20 ug/min via INTRAVENOUS

## 2023-06-26 MED ORDER — CHLORHEXIDINE GLUCONATE 0.12 % MT SOLN
15.0000 mL | Freq: Once | OROMUCOSAL | Status: AC
Start: 2023-06-26 — End: 2023-06-26
  Administered 2023-06-26: 15 mL via OROMUCOSAL
  Filled 2023-06-26: qty 15

## 2023-06-26 MED ORDER — PROPOFOL 10 MG/ML IV BOLUS
INTRAVENOUS | Status: DC | PRN
  Administered 2023-06-26: 100 mg via INTRAVENOUS

## 2023-06-26 MED ORDER — ACETAMINOPHEN 10 MG/ML IV SOLN: 1000.0000 mg | Freq: Once | INTRAVENOUS | Status: DC | PRN

## 2023-06-26 MED ORDER — DEXAMETHASONE SODIUM PHOSPHATE 10 MG/ML IJ SOLN
INTRAMUSCULAR | Status: DC | PRN
  Administered 2023-06-26: 5 mg via INTRAVENOUS

## 2023-06-26 MED ORDER — OXYCODONE HCL 5 MG PO TABS: 5.0000 mg | ORAL_TABLET | Freq: Once | ORAL | Status: DC | PRN

## 2023-06-26 MED ORDER — ROCURONIUM BROMIDE 10 MG/ML (PF) SYRINGE
PREFILLED_SYRINGE | INTRAVENOUS | Status: DC | PRN
  Administered 2023-06-26: 50 mg via INTRAVENOUS

## 2023-06-26 MED ORDER — SODIUM CHLORIDE 0.9% FLUSH: 10.0000 mL | Freq: Two times a day (BID) | INTRAVENOUS | Status: DC

## 2023-06-26 MED ORDER — FENTANYL CITRATE (PF) 250 MCG/5ML IJ SOLN
INTRAMUSCULAR | Status: DC | PRN
  Administered 2023-06-26 (×2): 50 ug via INTRAVENOUS

## 2023-06-26 MED ORDER — SODIUM CHLORIDE 0.9 % IV SOLN: INTRAVENOUS | Status: DC | PRN

## 2023-06-26 MED ORDER — ONDANSETRON HCL 4 MG/2ML IJ SOLN
INTRAMUSCULAR | Status: DC | PRN
  Administered 2023-06-26: 4 mg via INTRAVENOUS

## 2023-06-26 MED ORDER — GLYCOPYRROLATE 0.2 MG/ML IJ SOLN
INTRAMUSCULAR | Status: DC | PRN
  Administered 2023-06-26: .1 mg via INTRAVENOUS

## 2023-06-26 MED ORDER — SUGAMMADEX SODIUM 200 MG/2ML IV SOLN
INTRAVENOUS | Status: DC | PRN
  Administered 2023-06-26: 200 mg via INTRAVENOUS

## 2023-06-26 NOTE — H&P (Signed)
HPI 67 year old male returns.  He states his back pain has gotten significantly worse.  He has had significant stenosis at L3-4 and previous lumbar decompression surgery has been discussed.  He reminds me that he is still claustrophobic that he always has to undergo general anesthesia since he cannot tolerate it even with Valium in an open scanner.  Lumbar MRI scan 2022 ordered by Dr. Ferdie Ping showed severe spinal stenosis of the right neural foramina and moderate to severe central stenosis and moderate L5-S1 neuroforaminal narrowing.  In addition has had carpal tunnel syndrome positive electrical studies and is put off having the surgery done and states now his right hand is giving him severe problems and wants to proceed with right carpal tunnel release.  His left carpal tunnel release was done by me in November and is doing well.  He is also had anterior cervical discectomy and fusion 3 level of the C4-C7 doing well.  Patient states he wants to proceed with a new MRI and wants to schedule lumbar decompression surgery and also wants to proceed with right carpal tunnel release.  Patient's been out of work.  He is having problems standing only stand for a few minutes has to sit after sitting for for 5 minutes he has to get back up and move around.  He is having trouble sleeping.  Unable to walk more than short distance.  He gets slight relief leaning on a grocery cart but this only lasts for 15 to 20 seconds.  During the exam today moves from chair to standing leaning on the counter back to the chair and then back standing again multiple times.   Review of Systems all systems noncontributory to HPI.     Objective: Vital Signs: BP (!) 154/82   Pulse 63    Physical Exam Constitutional:      Appearance: He is well-developed.  HENT:     Head: Normocephalic and atraumatic.     Right Ear: External ear normal.     Left Ear: External ear normal.  Eyes:     Pupils: Pupils are equal, round, and reactive  to light.  Neck:     Thyroid: No thyromegaly.     Trachea: No tracheal deviation.  Cardiovascular:     Rate and Rhythm: Normal rate.  Pulmonary:     Effort: Pulmonary effort is normal.     Breath sounds: No wheezing.  Abdominal:     General: Bowel sounds are normal.     Palpations: Abdomen is soft.  Musculoskeletal:     Cervical back: Neck supple.  Skin:    General: Skin is warm and dry.     Capillary Refill: Capillary refill takes less than 2 seconds.  Neurological:     Mental Status: He is alert and oriented to person, place, and time.  Psychiatric:        Behavior: Behavior normal.        Thought Content: Thought content normal.        Judgment: Judgment normal.        Ortho Exam well-healed left carpal tunnel incision.  Right positive Phalen's positive carpal compression test.  Knee and ankle jerk are intact negative logroll hips.  Distal pulses are intact.   Specialty Comments:  EXAM: MRI LUMBAR SPINE WITHOUT CONTRAST   TECHNIQUE: Multiplanar, multisequence MR imaging of the lumbar spine was performed. No intravenous contrast was administered.   COMPARISON: 09/27/2020 and prior.   FINDINGS: Segmentation: Standard.   Alignment:  Straightening of lordosis. Trace grade 1 L3-4 anterolisthesis. Congenital spinal canal narrowing.   Vertebrae: Vertebral body heights are preserved. Modic type 2 endplate degenerative changes. No aggressive osseous lesion.   Conus medullaris and cauda equina: Conus extends to the L2 level. Conus and cauda equina appear normal.   Disc levels: Multilevel desiccation.   L1-2: No significant disc bulge. Prominent ligamentum flavum and facet degenerative spurring. Patent spinal canal and neural foramen.   L2-3: Disc bulge with left foraminal/extraforaminal protrusion with annular fissuring. Facet degenerative spurring. Mild spinal canal and bilateral neural foraminal narrowing.   L3-4: Disc bulge with superimposed bilateral foraminal  protrusions. Prominent ligamentum flavum and bilateral facet hypertrophy. Moderate to severe spinal canal narrowing. Severe right and mild left neural foraminal narrowing.   L4-5: Disc bulge, ligamentum flavum thickening and bilateral facet hypertrophy. Mild spinal canal and bilateral neural foraminal narrowing.   L5-S1: Disc bulge with inferiorly migrated right subarticular extrusion abutting the descending S1 nerve root. Bilateral facet hypertrophy. Mild spinal canal narrowing. Moderate right and mild left neural foraminal narrowing.   Paraspinal and other soft tissues: Negative.   IMPRESSION: 1. Multilevel spondylosis. Moderate to severe spinal canal and right neural foraminal narrowing at the L3-4 level. 2. Moderate right L5-S1 neural foraminal narrowing. 3. Otherwise mild spinal canal/neural foraminal narrowing at the L2-S1 levels.     Electronically Signed By: Stana Bunting M.D. On: 10/17/2020 15:23   Imaging: No results found.     PMFS History:        Patient Active Problem List    Diagnosis Date Noted   Carpal tunnel syndrome, right upper limb 02/25/2023   Carpal tunnel syndrome, left upper limb 05/03/2022   S/P cervical spinal fusion 08/14/2021   Other spondylosis with radiculopathy, cervical region 07/02/2021   Foraminal stenosis of cervical region 07/02/2021   GERD (gastroesophageal reflux disease) 06/28/2020   Hyperlipidemia 06/28/2020   Hypertension 06/28/2020   Bilateral hand pain 06/28/2020   Rheumatoid factor positive 06/28/2020   Anxiety 12/09/2018           Past Medical History:  Diagnosis Date   Anxiety     Asthma      no inhaler   Atopic dermatitis     Bee sting     BMI 29.0-29.9,adult     Cervical radiculopathy     Chronic insomnia     Complaint of paresthesia     Elevated blood pressure reading     Epididymitis     Family history of prostate cancer     GERD (gastroesophageal reflux disease)     Gout     Heart murmur      Hypertension     Lateral epicondylitis of right elbow     Pure hypercholesterolemia     Seasonal allergic rhinitis due to pollen                    Family History  Problem Relation Age of Onset   Healthy Son     Healthy Son     Lupus Niece                    Past Surgical History:  Procedure Laterality Date   ANTERIOR CERVICAL DECOMP/DISCECTOMY FUSION N/A 08/06/2021    Procedure: ANTERIOR CERVICAL DISCECTOMY FUSION C4-5, C5-6, C6-7, ALLOGRAFT, PLATE;  Surgeon: [name], MD;  Location: [facility] OR;  Service: Orthopedics;  Laterality: N/A;   CARPAL TUNNEL RELEASE Left 07/01/2022    Procedure: LEFT CARPAL  TUNNEL RELEASE;  Surgeon: [name], MD;  Location: [facility];  Service: Orthopedics;  Laterality: Left;   COLONOSCOPY   10/20/2017   RADIOLOGY WITH ANESTHESIA N/A 10/17/2020    Procedure: MRI WITH ANESTHESIA  LUMBAR WITHOUT CONTRAST AND CERVICAL WITHOUT CONTRAST;  Surgeon: Radiologist, Medication, MD;  Location: [facility] OR;  Service: Radiology;  Laterality: N/A;   UPPER GI ENDOSCOPY       VASECTOMY        Per patient         Social History             Occupational History   Not on file  Tobacco Use   Smoking status: Former      Types: Cigarettes      Quit date: 05/16/2012      Years since quitting: 10.7   Smokeless tobacco: Never  Vaping Use   Vaping Use: Never used  Substance and Sexual Activity   Alcohol use: Not Currently   Drug use: Yes      Frequency: 4.0 times per week      Types: Marijuana      Comment: uses daily   Sexual activity: Yes

## 2023-06-26 NOTE — Transfer of Care (Signed)
Immediate Anesthesia Transfer of Care Note  Patient: David Graves  Procedure(s) Performed: MRI WITH ANESTHESIA OF LUMBAR SPINE WITHOUT CONTRAST  Patient Location: PACU  Anesthesia Type:General  Level of Consciousness: awake, alert , and oriented  Airway & Oxygen Therapy: Patient Spontanous Breathing  Post-op Assessment: Report given to RN and Post -op Vital signs reviewed and stable  Post vital signs: Reviewed and stable  Last Vitals:  Vitals Value Taken Time  BP 158/71 06/26/23 0915  Temp 36.2 C 06/26/23 0911  Pulse 42 06/26/23 0918  Resp 9 06/26/23 0919  SpO2 100 % 06/26/23 0918  Vitals shown include unfiled device data.  Last Pain:  Vitals:   06/26/23 0911  TempSrc:   PainSc: 0-No pain         Complications: No notable events documented.

## 2023-06-26 NOTE — Anesthesia Procedure Notes (Signed)
Procedure Name: Intubation Date/Time: 06/26/2023 8:24 AM  Performed by: Camillia Herter, CRNAPre-anesthesia Checklist: Patient identified, Emergency Drugs available, Suction available and Patient being monitored Patient Re-evaluated:Patient Re-evaluated prior to induction Oxygen Delivery Method: Circle System Utilized Preoxygenation: Pre-oxygenation with 100% oxygen Induction Type: IV induction Ventilation: Mask ventilation without difficulty Laryngoscope Size: Miller and 2 Grade View: Grade I Tube type: Oral Tube size: 8.0 mm Number of attempts: 1 Airway Equipment and Method: Stylet and Oral airway Placement Confirmation: ETT inserted through vocal cords under direct vision, positive ETCO2 and breath sounds checked- equal and bilateral Secured at: 23 cm Tube secured with: Tape Dental Injury: Teeth and Oropharynx as per pre-operative assessment

## 2023-06-26 NOTE — Anesthesia Postprocedure Evaluation (Signed)
Anesthesia Post Note  Patient: David Graves  Procedure(s) Performed: MRI WITH ANESTHESIA OF LUMBAR SPINE WITHOUT CONTRAST     Patient location during evaluation: PACU Anesthesia Type: General Level of consciousness: awake and alert Pain management: pain level controlled Vital Signs Assessment: post-procedure vital signs reviewed and stable Respiratory status: spontaneous breathing, nonlabored ventilation, respiratory function stable and patient connected to nasal cannula oxygen Cardiovascular status: blood pressure returned to baseline and stable Postop Assessment: no apparent nausea or vomiting Anesthetic complications: no   No notable events documented.  Last Vitals:  Vitals:   06/26/23 0911 06/26/23 0915  BP: (!) 162/69 (!) 158/71  Pulse: (!) 46 (!) 43  Resp: 17 14  Temp: (!) 36.2 C   SpO2: 100% 100%    Last Pain:  Vitals:   06/26/23 0911  TempSrc:   PainSc: 0-No pain                 [place]

## 2023-06-27 ENCOUNTER — Encounter (HOSPITAL_COMMUNITY): Payer: Self-pay | Admitting: Radiology

## 2023-07-01 DIAGNOSIS — R140 Abdominal distension (gaseous): Secondary | ICD-10-CM | POA: Diagnosis not present

## 2023-07-04 ENCOUNTER — Ambulatory Visit: Payer: Medicare Other | Admitting: Orthopaedic Surgery

## 2023-07-10 ENCOUNTER — Ambulatory Visit: Payer: Medicare Other | Admitting: Orthopedic Surgery

## 2023-07-10 DIAGNOSIS — M5416 Radiculopathy, lumbar region: Secondary | ICD-10-CM | POA: Diagnosis not present

## 2023-07-10 NOTE — Progress Notes (Signed)
Orthopedic Spine Surgery Office Note   Assessment: Patient is a 67 y.o. male with low back pain that radiates into the right lower extremity. Has PI-LL mismatch and lateral listhesis. His mismatch is 24 degrees     Plan: -Patient has tried PT, tylenol NSAIDs, oral steroids, oxycodone -Recommended diagnostic/therapeutic injection with Dr. Alvester Morin.  Referral provided to him today -Previously had referred him to pain management but he could not continue with their program since he uses marijuana.  Explained to him that if his back pain is bad enough then I would suggest that he stops his marijuana use in order to be able to go to pain management -If we decide on surgery, will test his urine pre-op for nicotine -Patient should return to office on an as needed basis, if he comes back would want scoliosis x-rays     Patient expressed understanding of the plan and all questions were answered to the patient's satisfaction.    ___________________________________________________________________________     History:   Patient is a 67 y.o. male who presents today for follow up on his lumbar spine.  Patient is still having significant low back pain.  He feels it in the lower lumbar region.  Most of his pain is located in the low back.  He does have some pain that radiates along the lateral aspect of his right thigh and leg to the level of the ankle.  He is not having any pain rating into the left lower extremity.  He notes the pain on a daily basis.  He feels that the pain is worse with activity and gets better with rest.  He has not developed any new symptoms since he was last seen.   Treatments tried: PT, tylenol NSAIDs, oral steroids, oxycodone   Review of systems: Denies fevers and chills, night sweats, unexplained weight loss, history of cancer. Has pain that wakes him at night    Physical Exam:   General: no acute distress, appears stated age Neurologic: alert, answering questions  appropriately, following commands Respiratory: unlabored breathing on room air, symmetric chest rise Psychiatric: appropriate affect, normal cadence to speech     MSK (spine):   -Strength exam                                                   Left                  Right EHL                              5/5                  5/5 TA                                 5/5                  5/5 GSC                             5/5                  5/5 Knee extension  5/5                  5/5 Hip flexion                    5/5                  5/5   -Sensory exam                           Sensation intact to light touch in L3-S1 nerve distributions of bilateral lower extremities   -Achilles DTR: 2/4 on the left, 2/4 on the right -Patellar tendon DTR: 2/4 on the left, 2/4 on the right   -Straight leg raise: negative bilaterally -Clonus: no beats bilaterally   -Left hip exam: no pain through range of motion, negative stinchfield, negative faber -Right hip exam: no pain through range of motion, negative stinchfield, negative faber   Imaging: XRs of the lumbar spine from 04/07/2023 were previously independently reviewed and interpreted, showing lateral listhesis at L3/4 and L4/5. Disc height loss at L2/3, L3/4, L4/5, L5/S1. No fracture or dislocation seen. Loss of lumbar lordosis. PI of 60, LL of 36. PI-LL mismatch 24 degrees.   MRI of the lumbar spine from 06/26/2023 was independently reviewed and interpreted, showing multilevel degenerative disc disease.  Most significant degenerative disc is seen at L5/S1.  Modic changes are seen within the L endplates at L5/S1.  There is a small right-sided paracentral disc herniation at L5/S1.  Central stenosis at L3/4 with right-sided foraminal stenosis at that level.  Bilateral foraminal stenosis at L4/5 and L5/S1.     Patient name: David Graves Patient MRN: [id number] Date of visit: 07/10/23

## 2023-07-21 ENCOUNTER — Ambulatory Visit (INDEPENDENT_AMBULATORY_CARE_PROVIDER_SITE_OTHER): Payer: Medicare Other | Admitting: Physical Medicine and Rehabilitation

## 2023-07-21 ENCOUNTER — Other Ambulatory Visit: Payer: Self-pay

## 2023-07-21 DIAGNOSIS — G894 Chronic pain syndrome: Secondary | ICD-10-CM | POA: Diagnosis not present

## 2023-07-21 DIAGNOSIS — M4807 Spinal stenosis, lumbosacral region: Secondary | ICD-10-CM

## 2023-07-21 DIAGNOSIS — M5416 Radiculopathy, lumbar region: Secondary | ICD-10-CM

## 2023-07-21 DIAGNOSIS — M5116 Intervertebral disc disorders with radiculopathy, lumbar region: Secondary | ICD-10-CM

## 2023-07-21 DIAGNOSIS — Z981 Arthrodesis status: Secondary | ICD-10-CM | POA: Diagnosis not present

## 2023-07-21 MED ORDER — METHYLPREDNISOLONE ACETATE 40 MG/ML IJ SUSP
40.0000 mg | Freq: Once | INTRAMUSCULAR | Status: AC
  Administered 2023-07-21: 40 mg

## 2023-07-21 NOTE — Procedures (Signed)
Lumbar Epidural Steroid Injection - Interlaminar Approach with Fluoroscopic Guidance  Patient: David Graves      Date of Birth: 1956/06/10 MRN: [id number] PCP: Mila Palmer, MD      Visit Date: 07/21/2023   Universal Protocol:     Consent Given By: the patient  Position: PRONE  Additional Comments: Vital signs were monitored before and after the procedure. Patient was prepped and draped in the usual sterile fashion. The correct patient, procedure, and site was verified.   Injection Procedure Details:   Procedure diagnoses: Lumbar radiculopathy [M54.16]   Meds Administered:  Meds ordered this encounter  Medications   methylPREDNISolone acetate (DEPO-MEDROL) injection 40 mg     Laterality: Right  Location/Site:  L5-S1  Needle: 3.5 in., 20 ga. Tuohy  Needle Placement: Paramedian epidural  Findings:   -Comments: Excellent flow of contrast into the epidural space.  Procedure Details: Using a paramedian approach from the side mentioned above, the region overlying the inferior lamina was localized under fluoroscopic visualization and the soft tissues overlying this structure were infiltrated with 4 ml. of 1% Lidocaine without Epinephrine. The Tuohy needle was inserted into the epidural space using a paramedian approach.   The epidural space was localized using loss of resistance along with counter oblique bi-planar fluoroscopic views.  After negative aspirate for air, blood, and CSF, a 2 ml. volume of Isovue-250 was injected into the epidural space and the flow of contrast was observed. Radiographs were obtained for documentation purposes.    The injectate was administered into the level noted above.   Additional Comments:  No complications occurred Dressing: 2 x 2 sterile gauze and Band-Aid    Post-procedure details: Patient was observed during the procedure. Post-procedure instructions were reviewed.  Patient left the clinic in stable condition.

## 2023-07-21 NOTE — Progress Notes (Signed)
David Graves - 67 y.o. male MRN [id number]  Date of birth: 12/11/1955  Office Visit Note: Visit Date: 07/21/2023 PCP: Mila Palmer, MD Referred by: London Sheer, MD  Subjective: Chief Complaint  Patient presents with   Lower Back - Pain   HPI:  David Graves is a 67 y.o. male who comes in today at the request of Dr. Willia Craze for planned Right L5-S1 Lumbar Interlaminar epidural steroid injection with fluoroscopic guidance.  The patient has failed conservative care including home exercise, medications, time and activity modification.  This injection will be diagnostic and hopefully therapeutic.  Please see requesting physician notes for further details and justification.  Unfortunately, the patient suffers from pretty severe anxiety and had to have MRI even performed with conscious sedation.  He was able to complete the injection today.  We went through the preprocedure evaluation and he felt like he could do but once we numbed the skin up in the procedure room he was unable to continue at that point as he just had a tremendous amount of anxiety.  He tells me he really probably can even do this with oral sedation which I think we did give him Valium.  He will return to see Dr. Christell Constant in follow-up and they may be able to get this done with conscious sedation.  Dr. Lorrine Kin at [facility] and Dr. Ethelene Hal at [facility] are able to do these with conscious sedation.   I spent more than 30 minutes speaking face-to-face with the patient with 50% of the time in counseling and discussing coordination of care.    ROS Otherwise per HPI.  Assessment & Plan: Visit Diagnoses:    ICD-10-CM   1. Lumbar radiculopathy  M54.16 methylPREDNISolone acetate (DEPO-MEDROL) injection 40 mg    CANCELED: XR C-ARM NO REPORT    CANCELED: Epidural Steroid injection    2. Spinal stenosis of lumbosacral region  M48.07     3. Intervertebral disc disorders with radiculopathy,  lumbar region  M51.16     4. S/P cervical spinal fusion  Z98.1     5. Chronic pain syndrome  G89.4       Plan: Findings:  See HPI    Meds & Orders:  Meds ordered this encounter  Medications   methylPREDNISolone acetate (DEPO-MEDROL) injection 40 mg    No orders of the defined types were placed in this encounter.   Follow-up: Return for visit to requesting provider as needed.   Procedures: No procedures performed     Clinical History: MRI LUMBAR SPINE WITHOUT CONTRAST   TECHNIQUE: Multiplanar, multisequence MR imaging of the lumbar spine was performed. No intravenous contrast was administered.   COMPARISON:  Lumbar spine MRI 10/17/2020   FINDINGS: Segmentation: Standard; the lowest formed disc space is designated L5-S1.   Alignment: There is straightening of the normal lumbar lordosis. There is grade 1 anterolisthesis of L3 on L4 and grade 1 retrolisthesis of L5 on S1, unchanged.   Vertebrae: Vertebral body heights are preserved. Background marrow signal is normal. There is degenerative endplate signal abnormality at L2-L3 through L5-S1, overall similar to the prior study and without significant edema. There is no suspicious marrow signal abnormality or marrow edema.   Conus medullaris and cauda equina: Conus extends to the L1-L2 level. Conus and cauda equina appear normal.   Paraspinal and other soft tissues: Unremarkable.   Disc levels:   There is advanced disc desiccation and narrowing at L5-S1 and moderate disc degeneration elsewhere, overall  similar to 2022.   T12-L1: Only imaged in the sagittal plane. There is a disc bulge eccentric to the right resulting in mild spinal canal stenosis and mild right worse than left neural foraminal stenosis, progressed since 2022.   L1-L2: There is a diffuse disc bulge which is new/progressed since 2022, and mild facet arthropathy but no significant spinal canal or neural foraminal stenosis.   L2-L3: There is a  diffuse disc bulge with central annular fissure, endplate spurring, and mild facet arthropathy resulting in moderate spinal canal stenosis without significant neural foraminal stenosis progressed since 2022.   L3-L4: There is grade 1 anterolisthesis with a diffuse disc bulge, right worse than left endplate spurring, and moderate facet arthropathy with ligamentum flavum thickening resulting in severe spinal canal stenosis with right worse than left subarticular zone effacement, and severe right and mild left neural foraminal stenosis. The spinal canal stenosis is progressed.   L4-L5: There is a diffuse disc bulge, endplate spurring, and mild-to-moderate left and mild right facet arthropathy with ligamentum flavum thickening resulting in mild spinal canal stenosis with bilateral subarticular zone narrowing, and severe left and mild-to-moderate right neural foraminal stenosis, progressed.   L5-S1: There is a disc bulge with superimposed right subarticular zone inferiorly migrated extrusion, endplate spurring, and mild facet arthropathy resulting in right subarticular zone narrowing with probable impingement of the traversing S1 nerve root, and mild right worse than left neural foraminal stenosis. Findings are similar to the prior study.   IMPRESSION: 1. Multilevel degenerative changes throughout the lumbar spine have overall progressed since 2022. 2. At L2-L3, there is moderate spinal canal stenosis, progressed. 3. At L3-L4, there is severe spinal canal stenosis with right worse than left subarticular zone effacement, and severe right neural foraminal stenosis. The spinal canal stenosis is progressed. 4. At L4-L5, there is mild spinal canal stenosis and severe left and mild-to-moderate right neural foraminal stenosis, progressed. 5. At L5-S1, there is a disc bulge with superimposed right subarticular zone inferiorly migrated extrusion which may impinge the traversing S1 nerve root,  similar to the prior study.     Electronically Signed   By: Lesia Hausen M.D.   On: 06/26/2023 09:30     Objective:  VS:  HT:    WT:   BMI:     BP:   HR: bpm  TEMP: ( )  RESP:  Physical Exam Vitals and nursing note reviewed.  Constitutional:      General: He is not in acute distress.    Appearance: Normal appearance. He is not ill-appearing.  HENT:     Head: Normocephalic and atraumatic.     Right Ear: External ear normal.     Left Ear: External ear normal.     Nose: No congestion.  Eyes:     Extraocular Movements: Extraocular movements intact.  Cardiovascular:     Rate and Rhythm: Normal rate.     Pulses: Normal pulses.  Pulmonary:     Effort: Pulmonary effort is normal. No respiratory distress.  Abdominal:     General: There is no distension.     Palpations: Abdomen is soft.  Musculoskeletal:        General: No tenderness or signs of injury.     Cervical back: Neck supple.     Right lower leg: No edema.     Left lower leg: No edema.     Comments: Patient has good distal strength without clonus.  Skin:    Findings: No erythema or rash.  Neurological:     General: No focal deficit present.     Mental Status: He is alert and oriented to person, place, and time.     Sensory: No sensory deficit.     Motor: No weakness or abnormal muscle tone.     Coordination: Coordination normal.  Psychiatric:        Mood and Affect: Mood normal.        Behavior: Behavior normal.      Imaging: No results found.

## 2023-07-26 ENCOUNTER — Encounter: Payer: Self-pay | Admitting: Physical Medicine and Rehabilitation

## 2023-08-15 ENCOUNTER — Ambulatory Visit (INDEPENDENT_AMBULATORY_CARE_PROVIDER_SITE_OTHER): Payer: Medicare Other | Admitting: Orthopaedic Surgery

## 2023-08-15 ENCOUNTER — Other Ambulatory Visit (INDEPENDENT_AMBULATORY_CARE_PROVIDER_SITE_OTHER): Payer: Medicare Other

## 2023-08-15 DIAGNOSIS — Z981 Arthrodesis status: Secondary | ICD-10-CM

## 2023-08-15 DIAGNOSIS — M7541 Impingement syndrome of right shoulder: Secondary | ICD-10-CM | POA: Diagnosis not present

## 2023-08-15 NOTE — Progress Notes (Unsigned)
Office Visit Note   Patient: David Graves           Date of Birth: [birth year]           MRN: [id number] Visit Date: 08/15/2023              Requested by: Mila Palmer, MD [address] [place],  [place] [address] PCP: Mila Palmer, MD   Assessment & Plan: Visit Diagnoses:  1. S/P cervical spinal fusion     Plan: ***  Follow-Up Instructions: No follow-ups on file.   Orders:  Orders Placed This Encounter  Procedures   XR Cervical Spine 2 or 3 views   No orders of the defined types were placed in this encounter.     Procedures: Large Joint Inj: R subacromial bursa on 08/15/2023 10:14 AM Indications: pain Details: 22 G 1.5 in needle, lateral approach  Arthrogram: No  Medications: 4 mL bupivacaine 0.25 %; 40 mg methylPREDNISolone acetate 40 MG/ML; 0.5 mL lidocaine 1 % Outcome: tolerated well, no immediate complications Procedure, treatment alternatives, risks and benefits explained, specific risks discussed. Consent was given by the patient. Immediately prior to procedure a time out was called to verify the correct patient, procedure, equipment, support staff and site/side marked as required. Patient was prepped and draped in the usual sterile fashion.       Clinical Data: No additional findings.   Subjective: Chief Complaint  Patient presents with   Neck - Pain   Right Arm - Pain    HPI  Review of Systems   Objective: Vital Signs: There were no vitals taken for this visit.  Physical Exam  Ortho Exam  Specialty Comments:  MRI LUMBAR SPINE WITHOUT CONTRAST   TECHNIQUE: Multiplanar, multisequence MR imaging of the lumbar spine was performed. No intravenous contrast was administered.   COMPARISON:  Lumbar spine MRI 10/17/2020   FINDINGS: Segmentation: Standard; the lowest formed disc space is designated L5-S1.   Alignment: There is straightening of the normal lumbar lordosis. There is grade 1 anterolisthesis of L3 on  L4 and grade 1 retrolisthesis of L5 on S1, unchanged.   Vertebrae: Vertebral body heights are preserved. Background marrow signal is normal. There is degenerative endplate signal abnormality at L2-L3 through L5-S1, overall similar to the prior study and without significant edema. There is no suspicious marrow signal abnormality or marrow edema.   Conus medullaris and cauda equina: Conus extends to the L1-L2 level. Conus and cauda equina appear normal.   Paraspinal and other soft tissues: Unremarkable.   Disc levels:   There is advanced disc desiccation and narrowing at L5-S1 and moderate disc degeneration elsewhere, overall similar to 2022.   T12-L1: Only imaged in the sagittal plane. There is a disc bulge eccentric to the right resulting in mild spinal canal stenosis and mild right worse than left neural foraminal stenosis, progressed since 2022.   L1-L2: There is a diffuse disc bulge which is new/progressed since 2022, and mild facet arthropathy but no significant spinal canal or neural foraminal stenosis.   L2-L3: There is a diffuse disc bulge with central annular fissure, endplate spurring, and mild facet arthropathy resulting in moderate spinal canal stenosis without significant neural foraminal stenosis progressed since 2022.   L3-L4: There is grade 1 anterolisthesis with a diffuse disc bulge, right worse than left endplate spurring, and moderate facet arthropathy with ligamentum flavum thickening resulting in severe spinal canal stenosis with right worse than left subarticular zone effacement, and severe right and  mild left neural foraminal stenosis. The spinal canal stenosis is progressed.   L4-L5: There is a diffuse disc bulge, endplate spurring, and mild-to-moderate left and mild right facet arthropathy with ligamentum flavum thickening resulting in mild spinal canal stenosis with bilateral subarticular zone narrowing, and severe left and mild-to-moderate right  neural foraminal stenosis, progressed.   L5-S1: There is a disc bulge with superimposed right subarticular zone inferiorly migrated extrusion, endplate spurring, and mild facet arthropathy resulting in right subarticular zone narrowing with probable impingement of the traversing S1 nerve root, and mild right worse than left neural foraminal stenosis. Findings are similar to the prior study.   IMPRESSION: 1. Multilevel degenerative changes throughout the lumbar spine have overall progressed since 2022. 2. At L2-L3, there is moderate spinal canal stenosis, progressed. 3. At L3-L4, there is severe spinal canal stenosis with right worse than left subarticular zone effacement, and severe right neural foraminal stenosis. The spinal canal stenosis is progressed. 4. At L4-L5, there is mild spinal canal stenosis and severe left and mild-to-moderate right neural foraminal stenosis, progressed. 5. At L5-S1, there is a disc bulge with superimposed right subarticular zone inferiorly migrated extrusion which may impinge the traversing S1 nerve root, similar to the prior study.     Electronically Signed   By: Lesia Hausen M.D.   On: 06/26/2023 09:30  Imaging: No results found.   PMFS History: Patient Active Problem List   Diagnosis Date Noted   Carpal tunnel syndrome, right upper limb 02/25/2023   Spinal stenosis of lumbar region 02/25/2023   Carpal tunnel syndrome, left upper limb 05/03/2022   S/P cervical spinal fusion 08/14/2021   Other spondylosis with radiculopathy, cervical region 07/02/2021   Foraminal stenosis of cervical region 07/02/2021   GERD (gastroesophageal reflux disease) 06/28/2020   Hyperlipidemia 06/28/2020   Hypertension 06/28/2020   Bilateral hand pain 06/28/2020   Rheumatoid factor positive 06/28/2020   Anxiety 12/09/2018   Past Medical History:  Diagnosis Date   Anxiety    Asthma    followed by pcp   (04-22-2023  per pt symbicort inhaler prn,  last used  approx few months ago)   BPH associated with nocturia    urologist--- dr Mena Goes   Chronic insomnia    Claustrophobia    Depression    ED (erectile dysfunction)    Family history of prostate cancer    GERD (gastroesophageal reflux disease)    Hiatal hernia    History of epididymitis    History of gout    04-22-2023  per pt last episode approx 2019 great toe   Hyperlipidemia    Hypertension    evaluated by cardiology for chest pain -- dr Anne Fu-- note in epic 05-17-2020,  unable to to [facility] due to claustrophobia; 07-18-2020 ETT done personally reviewed by Dr Anne Fu "overall reassuring, continue HTN control, no further cardiac work-up at this time   Lateral epicondylitis of right elbow    Lumbar radiculopathy    Lumbar spinal stenosis    Marijuana use, continuous    04-22-2023  per pt smokes 1-2 daily   Seasonal allergic rhinitis due to pollen    Wears glasses     Family History  Problem Relation Age of Onset   Healthy Son    Healthy Son    Lupus Niece     Past Surgical History:  Procedure Laterality Date   ANTERIOR CERVICAL DECOMP/DISCECTOMY FUSION N/A 08/06/2021   Procedure: ANTERIOR CERVICAL DISCECTOMY FUSION C4-5, C5-6, C6-7, ALLOGRAFT, PLATE;  Surgeon: Ophelia Charter,  Veverly Fells, MD;  Location: [facility];  Service: Orthopedics;  Laterality: N/A;   CARPAL TUNNEL RELEASE Left 07/01/2022   Procedure: LEFT CARPAL TUNNEL RELEASE;  Surgeon: [name], MD;  Location: [facility];  Service: Orthopedics;  Laterality: Left;   CARPAL TUNNEL RELEASE Right 03/17/2023   Procedure: RIGHT CARPAL TUNNEL RELEASE;  Surgeon: [name], MD;  Location: [facility];  Service: Orthopedics;  Laterality: Right;   COLONOSCOPY  10/20/2017   PROSTATE SURGERY  04/2023   blockage in prostate   RADIOLOGY WITH ANESTHESIA N/A 10/17/2020   Procedure: MRI WITH ANESTHESIA  LUMBAR WITHOUT CONTRAST AND CERVICAL WITHOUT CONTRAST;  Surgeon: Radiologist, Medication, MD;  Location: [facility];   Service: Radiology;  Laterality: N/A;   RADIOLOGY WITH ANESTHESIA N/A 06/26/2023   Procedure: MRI WITH ANESTHESIA OF LUMBAR SPINE WITHOUT CONTRAST;  Surgeon: Radiologist, Medication, MD;  Location: [facility];  Service: Radiology;  Laterality: N/A;   THULIUM LASER TURP (TRANSURETHRAL RESECTION OF PROSTATE) N/A 04/25/2023   Procedure: THULIUM LASER TURP (TRANSURETHRAL RESECTION OF PROSTATE);  Surgeon: Jerilee Field, MD;  Location: [facility];  Service: Urology;  Laterality: N/A;   UPPER GI ENDOSCOPY  2020   Social History   Occupational History   Not on file  Tobacco Use   Smoking status: Former    Current packs/day: 0.00    Types: Cigarettes    Quit date: 05/16/2012    Years since quitting: 11.2   Smokeless tobacco: Never   Tobacco comments:    04-22-2023  per pt smoked cigarettes 2013, smoked for 38 yrs  (started approx age 41)  Vaping Use   Vaping status: Never Used  Substance and Sexual Activity   Alcohol use: Not Currently   Drug use: Yes    Types: Marijuana    Comment: Daily, last use 04/23/23 unitl after surgery   Sexual activity: Yes    Comment: VASECTOMY

## 2023-08-18 DIAGNOSIS — M7541 Impingement syndrome of right shoulder: Secondary | ICD-10-CM | POA: Insufficient documentation

## 2023-08-18 MED ORDER — METHYLPREDNISOLONE ACETATE 40 MG/ML IJ SUSP
40.0000 mg | INTRAMUSCULAR | Status: AC | PRN
  Administered 2023-08-15: 40 mg via INTRA_ARTICULAR

## 2023-08-18 MED ORDER — BUPIVACAINE HCL 0.25 % IJ SOLN
4.0000 mL | INTRAMUSCULAR | Status: AC | PRN
  Administered 2023-08-15: 4 mL via INTRA_ARTICULAR

## 2023-08-18 MED ORDER — LIDOCAINE HCL 1 % IJ SOLN
0.5000 mL | INTRAMUSCULAR | Status: AC | PRN
  Administered 2023-08-15: .5 mL

## 2023-12-29 DIAGNOSIS — Z008 Encounter for other general examination: Secondary | ICD-10-CM | POA: Diagnosis not present

## 2023-12-29 DIAGNOSIS — G9520 Unspecified cord compression: Secondary | ICD-10-CM | POA: Diagnosis not present

## 2023-12-29 DIAGNOSIS — J4520 Mild intermittent asthma, uncomplicated: Secondary | ICD-10-CM | POA: Diagnosis not present

## 2023-12-29 DIAGNOSIS — E785 Hyperlipidemia, unspecified: Secondary | ICD-10-CM | POA: Diagnosis not present

## 2023-12-29 DIAGNOSIS — I10 Essential (primary) hypertension: Secondary | ICD-10-CM | POA: Diagnosis not present

## 2023-12-29 DIAGNOSIS — F17211 Nicotine dependence, cigarettes, in remission: Secondary | ICD-10-CM | POA: Diagnosis not present

## 2024-01-12 ENCOUNTER — Ambulatory Visit: Admitting: Physician Assistant

## 2024-01-12 ENCOUNTER — Encounter: Payer: Self-pay | Admitting: Physician Assistant

## 2024-01-12 DIAGNOSIS — M25511 Pain in right shoulder: Secondary | ICD-10-CM | POA: Diagnosis not present

## 2024-01-12 DIAGNOSIS — M48061 Spinal stenosis, lumbar region without neurogenic claudication: Secondary | ICD-10-CM | POA: Diagnosis not present

## 2024-01-12 MED ORDER — METHYLPREDNISOLONE 4 MG PO TBPK: ORAL_TABLET | ORAL | 0 refills | Status: AC

## 2024-01-12 MED ORDER — METHOCARBAMOL 500 MG PO TABS: 500.0000 mg | ORAL_TABLET | Freq: Four times a day (QID) | ORAL | 0 refills | Status: AC | PRN

## 2024-01-12 NOTE — Progress Notes (Signed)
 Office Visit Note   Patient: David Graves           Date of Birth: 10-09-1955           MRN: [id number] Visit Date: 01/12/2024              Requested by: Olin Bertin, MD [address] [place],  [place] [address] PCP: Olin Bertin, MD   Assessment & Plan: Visit Diagnoses:  1. Spinal stenosis of lumbar region without neurogenic claudication   2. Acute pain of right shoulder     Plan: Patient is a 68 year old gentleman who is a previous patient of Dr. Murrel Arnt who treated him for shoulder impingement also did a cervical fusion on him.  Most recently he saw Dr. Sulema Endo with lumbar stenosis.  At that time he was discussed pain management if he could sustain from marijuana use.  Also was discussed maybe surgery although that was not as advocated.  He comes in for 2 reasons today 1 he has return of right shoulder impingement symptoms.  And recently he slipped and hurt his back when he was mowing the lawn.  He has no radicular findings that have increased no loss of strength no loss of bowel or bladder control right shoulder is consistent with impingement findings I recommended if we can give him an oral steroid this might help both issues.  Reminded him he cannot take ibuprofen and other side effects to take it with food.  He would like to try that rather than just an injection in the shoulder.  Also asked that he make a follow-up appointment with Dr. Sulema Endo for his low back.  Will also call him in a mild muscle relaxant  Follow-Up Instructions: No follow-ups on file.   Orders:  No orders of the defined types were placed in this encounter.  No orders of the defined types were placed in this encounter.     Procedures: No procedures performed   Clinical Data: No additional findings.   Subjective: No chief complaint on file.   HPI Mr. David Graves is a pleasant 68 year old gentleman who comes in with right shoulder pain.  He denies any injury but has had trouble in the past and  got an injection with Dr. Murrel Arnt which seem to help him a little bit.  Unfortunately he also has a history of low back issues for which she is seeing Dr. Sulema Endo.  He said he was mowing the lawn over the weekend and had recurrence of pain in the low back that radiates down the left greater than the right leg no weakness no loss of bowel or bladder control  Review of Systems  All other systems reviewed and are negative.    Objective: Vital Signs: There were no vitals taken for this visit.  Physical Exam Constitutional:      Appearance: Normal appearance.  Pulmonary:     Effort: Pulmonary effort is normal.  Neurological:     General: No focal deficit present.     Mental Status: He is alert and oriented to person, place, and time.  Psychiatric:        Mood and Affect: Mood normal.        Behavior: Behavior normal.     Ortho Exam Examination of his right shoulder is forward elevation actively internal rotation behind the back though painful good strength with external rotation abduction mildly positive empty can test.  He does have some paresthesias in his hand but good grip strength good  biceps triceps strength. Lower back he has pain over the focal lower back no step-offs no abnormalities no erythema.  He has good strength with resisted extension and flexion of his legs and with flexion extension Specialty Comments:  MRI LUMBAR SPINE WITHOUT CONTRAST   TECHNIQUE: Multiplanar, multisequence MR imaging of the lumbar spine was performed. No intravenous contrast was administered.   COMPARISON:  Lumbar spine MRI 10/17/2020   FINDINGS: Segmentation: Standard; the lowest formed disc space is designated L5-S1.   Alignment: There is straightening of the normal lumbar lordosis. There is grade 1 anterolisthesis of L3 on L4 and grade 1 retrolisthesis of L5 on S1, unchanged.   Vertebrae: Vertebral body heights are preserved. Background marrow signal is normal. There is degenerative  endplate signal abnormality at L2-L3 through L5-S1, overall similar to the prior study and without significant edema. There is no suspicious marrow signal abnormality or marrow edema.   Conus medullaris and cauda equina: Conus extends to the L1-L2 level. Conus and cauda equina appear normal.   Paraspinal and other soft tissues: Unremarkable.   Disc levels:   There is advanced disc desiccation and narrowing at L5-S1 and moderate disc degeneration elsewhere, overall similar to 2022.   T12-L1: Only imaged in the sagittal plane. There is a disc bulge eccentric to the right resulting in mild spinal canal stenosis and mild right worse than left neural foraminal stenosis, progressed since 2022.   L1-L2: There is a diffuse disc bulge which is new/progressed since 2022, and mild facet arthropathy but no significant spinal canal or neural foraminal stenosis.   L2-L3: There is a diffuse disc bulge with central annular fissure, endplate spurring, and mild facet arthropathy resulting in moderate spinal canal stenosis without significant neural foraminal stenosis progressed since 2022.   L3-L4: There is grade 1 anterolisthesis with a diffuse disc bulge, right worse than left endplate spurring, and moderate facet arthropathy with ligamentum flavum thickening resulting in severe spinal canal stenosis with right worse than left subarticular zone effacement, and severe right and mild left neural foraminal stenosis. The spinal canal stenosis is progressed.   L4-L5: There is a diffuse disc bulge, endplate spurring, and mild-to-moderate left and mild right facet arthropathy with ligamentum flavum thickening resulting in mild spinal canal stenosis with bilateral subarticular zone narrowing, and severe left and mild-to-moderate right neural foraminal stenosis, progressed.   L5-S1: There is a disc bulge with superimposed right subarticular zone inferiorly migrated extrusion, endplate spurring, and  mild facet arthropathy resulting in right subarticular zone narrowing with probable impingement of the traversing S1 nerve root, and mild right worse than left neural foraminal stenosis. Findings are similar to the prior study.   IMPRESSION: 1. Multilevel degenerative changes throughout the lumbar spine have overall progressed since 2022. 2. At L2-L3, there is moderate spinal canal stenosis, progressed. 3. At L3-L4, there is severe spinal canal stenosis with right worse than left subarticular zone effacement, and severe right neural foraminal stenosis. The spinal canal stenosis is progressed. 4. At L4-L5, there is mild spinal canal stenosis and severe left and mild-to-moderate right neural foraminal stenosis, progressed. 5. At L5-S1, there is a disc bulge with superimposed right subarticular zone inferiorly migrated extrusion which may impinge the traversing S1 nerve root, similar to the prior study.     Electronically Signed   By: Eldora Greet M.D.   On: 06/26/2023 09:30  Imaging: No results found.   PMFS History: Patient Active Problem List   Diagnosis Date Noted   Pain in  right shoulder 01/12/2024   Impingement syndrome of right shoulder 08/18/2023   Carpal tunnel syndrome, right upper limb 02/25/2023   Spinal stenosis of lumbar region 02/25/2023   Carpal tunnel syndrome, left upper limb 05/03/2022   S/P cervical spinal fusion 08/14/2021   Other spondylosis with radiculopathy, cervical region 07/02/2021   Foraminal stenosis of cervical region 07/02/2021   GERD (gastroesophageal reflux disease) 06/28/2020   Hyperlipidemia 06/28/2020   Hypertension 06/28/2020   Bilateral hand pain 06/28/2020   Rheumatoid factor positive 06/28/2020   Anxiety 12/09/2018   Past Medical History:  Diagnosis Date   Anxiety    Asthma    followed by pcp   (04-22-2023  per pt symbicort inhaler prn,  last used approx few months ago)   BPH associated with nocturia    urologist--- dr Derrick Fling    Chronic insomnia    Claustrophobia    Depression    ED (erectile dysfunction)    Family history of prostate cancer    GERD (gastroesophageal reflux disease)    Hiatal hernia    History of epididymitis    History of gout    04-22-2023  per pt last episode approx 2019 great toe   Hyperlipidemia    Hypertension    evaluated by cardiology for chest pain -- dr Renna Cary-- note in epic 05-17-2020,  unable to to [facility] due to claustrophobia; 07-18-2020 ETT done personally reviewed by Dr Renna Cary "overall reassuring, continue HTN control, no further cardiac work-up at this time   Lateral epicondylitis of right elbow    Lumbar radiculopathy    Lumbar spinal stenosis    Marijuana use, continuous    04-22-2023  per pt smokes 1-2 daily   Seasonal allergic rhinitis due to pollen    Wears glasses     Family History  Problem Relation Age of Onset   Healthy Son    Healthy Son    Lupus Niece     Past Surgical History:  Procedure Laterality Date   ANTERIOR CERVICAL DECOMP/DISCECTOMY FUSION N/A 08/06/2021   Procedure: ANTERIOR CERVICAL DISCECTOMY FUSION C4-5, C5-6, C6-7, ALLOGRAFT, PLATE;  Surgeon: Adah Acron, MD;  Location: [facility];  Service: Orthopedics;  Laterality: N/A;   CARPAL TUNNEL RELEASE Left 07/01/2022   Procedure: LEFT CARPAL TUNNEL RELEASE;  Surgeon: Adah Acron, MD;  Location: [facility];  Service: Orthopedics;  Laterality: Left;   CARPAL TUNNEL RELEASE Right 03/17/2023   Procedure: RIGHT CARPAL TUNNEL RELEASE;  Surgeon: Adah Acron, MD;  Location: [facility];  Service: Orthopedics;  Laterality: Right;   COLONOSCOPY  10/20/2017   PROSTATE SURGERY  04/2023   blockage in prostate   RADIOLOGY WITH ANESTHESIA N/A 10/17/2020   Procedure: MRI WITH ANESTHESIA  LUMBAR WITHOUT CONTRAST AND CERVICAL WITHOUT CONTRAST;  Surgeon: Radiologist, Medication, MD;  Location: [facility];  Service: Radiology;  Laterality: N/A;   RADIOLOGY WITH ANESTHESIA N/A 06/26/2023    Procedure: MRI WITH ANESTHESIA OF LUMBAR SPINE WITHOUT CONTRAST;  Surgeon: Radiologist, Medication, MD;  Location: [facility];  Service: Radiology;  Laterality: N/A;   THULIUM LASER TURP (TRANSURETHRAL RESECTION OF PROSTATE) N/A 04/25/2023   Procedure: THULIUM LASER TURP (TRANSURETHRAL RESECTION OF PROSTATE);  Surgeon: Christina Coyer, MD;  Location: [facility];  Service: Urology;  Laterality: N/A;   UPPER GI ENDOSCOPY  2020   Social History   Occupational History   Not on file  Tobacco Use   Smoking status: Former    Current packs/day: 0.00  Types: Cigarettes    Quit date: 05/16/2012    Years since quitting: 11.6   Smokeless tobacco: Never   Tobacco comments:    04-22-2023  per pt smoked cigarettes 2013, smoked for 38 yrs  (started approx age 8)  Vaping Use   Vaping status: Never Used  Substance and Sexual Activity   Alcohol use: Not Currently   Drug use: Yes    Types: Marijuana    Comment: Daily, last use 04/23/23 unitl after surgery   Sexual activity: Yes    Comment: VASECTOMY

## 2024-01-28 ENCOUNTER — Ambulatory Visit: Admitting: Orthopedic Surgery

## 2024-02-20 DIAGNOSIS — Z91030 Bee allergy status: Secondary | ICD-10-CM | POA: Diagnosis not present

## 2024-02-20 DIAGNOSIS — J302 Other seasonal allergic rhinitis: Secondary | ICD-10-CM | POA: Diagnosis not present

## 2024-02-20 DIAGNOSIS — T63461A Toxic effect of venom of wasps, accidental (unintentional), initial encounter: Secondary | ICD-10-CM | POA: Diagnosis not present

## 2024-03-19 DIAGNOSIS — Z0000 Encounter for general adult medical examination without abnormal findings: Secondary | ICD-10-CM | POA: Diagnosis not present

## 2024-03-19 DIAGNOSIS — Z79899 Other long term (current) drug therapy: Secondary | ICD-10-CM | POA: Diagnosis not present

## 2024-03-19 DIAGNOSIS — I10 Essential (primary) hypertension: Secondary | ICD-10-CM | POA: Diagnosis not present

## 2024-03-19 DIAGNOSIS — E7800 Pure hypercholesterolemia, unspecified: Secondary | ICD-10-CM | POA: Diagnosis not present

## 2024-03-19 DIAGNOSIS — R7989 Other specified abnormal findings of blood chemistry: Secondary | ICD-10-CM | POA: Diagnosis not present

## 2024-03-19 DIAGNOSIS — R946 Abnormal results of thyroid function studies: Secondary | ICD-10-CM | POA: Diagnosis not present

## 2024-03-19 DIAGNOSIS — Z125 Encounter for screening for malignant neoplasm of prostate: Secondary | ICD-10-CM | POA: Diagnosis not present

## 2024-05-12 DIAGNOSIS — I10 Essential (primary) hypertension: Secondary | ICD-10-CM | POA: Diagnosis not present

## 2024-05-12 DIAGNOSIS — M25511 Pain in right shoulder: Secondary | ICD-10-CM | POA: Diagnosis not present

## 2024-05-18 ENCOUNTER — Ambulatory Visit (INDEPENDENT_AMBULATORY_CARE_PROVIDER_SITE_OTHER): Admitting: Physician Assistant

## 2024-05-18 ENCOUNTER — Encounter: Payer: Self-pay | Admitting: Physician Assistant

## 2024-05-18 DIAGNOSIS — M25511 Pain in right shoulder: Secondary | ICD-10-CM

## 2024-05-18 NOTE — Progress Notes (Signed)
 Office Visit Note   Patient: David Graves           Date of Birth: 12/12/1955           MRN: [id number] Visit Date: 05/18/2024              Requested by: Verena Mems, MD [address] [place],  [place] [address] PCP: Verena Mems, MD   Assessment & Plan: Visit Diagnoses:  1. Right shoulder pain, unspecified chronicity     Plan: Patient is a pleasant 68 year old gentleman who have seen for impingement in his right shoulder in the past.  He has also had injections with Dr. Barbarann.  He has no recent injury I did give him an oral Dosepak in the spring because he was also having low back issues.  Comes in today requesting a shoulder injection.  Patient had a subacromial injection without difficulty if this does not relieve his symptoms will contact me in about a week  Follow-Up Instructions: Return if symptoms worsen or fail to improve.   Orders:  No orders of the defined types were placed in this encounter.  No orders of the defined types were placed in this encounter.     Procedures: No procedures performed   Clinical Data: No additional findings.   Subjective: No chief complaint on file.   HPI Pleasant 68 year old gentleman comes in today for right shoulder impingement requesting an injection no new injuries Review of Systems  All other systems reviewed and are negative.    Objective: Vital Signs: There were no vitals taken for this visit.  Physical Exam Constitutional:      Appearance: Normal appearance.  Pulmonary:     Effort: Pulmonary effort is normal.  Skin:    General: Skin is warm and dry.  Neurological:     General: No focal deficit present.     Mental Status: He is alert and oriented to person, place, and time.     Ortho Exam Right shoulder he has positive impingement findings positive empty can test has pain with overhead motion neurologically is intact Specialty Comments:  MRI LUMBAR SPINE WITHOUT CONTRAST    TECHNIQUE: Multiplanar, multisequence MR imaging of the lumbar spine was performed. No intravenous contrast was administered.   COMPARISON:  Lumbar spine MRI 10/17/2020   FINDINGS: Segmentation: Standard; the lowest formed disc space is designated L5-S1.   Alignment: There is straightening of the normal lumbar lordosis. There is grade 1 anterolisthesis of L3 on L4 and grade 1 retrolisthesis of L5 on S1, unchanged.   Vertebrae: Vertebral body heights are preserved. Background marrow signal is normal. There is degenerative endplate signal abnormality at L2-L3 through L5-S1, overall similar to the prior study and without significant edema. There is no suspicious marrow signal abnormality or marrow edema.   Conus medullaris and cauda equina: Conus extends to the L1-L2 level. Conus and cauda equina appear normal.   Paraspinal and other soft tissues: Unremarkable.   Disc levels:   There is advanced disc desiccation and narrowing at L5-S1 and moderate disc degeneration elsewhere, overall similar to 2022.   T12-L1: Only imaged in the sagittal plane. There is a disc bulge eccentric to the right resulting in mild spinal canal stenosis and mild right worse than left neural foraminal stenosis, progressed since 2022.   L1-L2: There is a diffuse disc bulge which is new/progressed since 2022, and mild facet arthropathy but no significant spinal canal or neural foraminal stenosis.   L2-L3: There is a  diffuse disc bulge with central annular fissure, endplate spurring, and mild facet arthropathy resulting in moderate spinal canal stenosis without significant neural foraminal stenosis progressed since 2022.   L3-L4: There is grade 1 anterolisthesis with a diffuse disc bulge, right worse than left endplate spurring, and moderate facet arthropathy with ligamentum flavum thickening resulting in severe spinal canal stenosis with right worse than left subarticular zone effacement, and  severe right and mild left neural foraminal stenosis. The spinal canal stenosis is progressed.   L4-L5: There is a diffuse disc bulge, endplate spurring, and mild-to-moderate left and mild right facet arthropathy with ligamentum flavum thickening resulting in mild spinal canal stenosis with bilateral subarticular zone narrowing, and severe left and mild-to-moderate right neural foraminal stenosis, progressed.   L5-S1: There is a disc bulge with superimposed right subarticular zone inferiorly migrated extrusion, endplate spurring, and mild facet arthropathy resulting in right subarticular zone narrowing with probable impingement of the traversing S1 nerve root, and mild right worse than left neural foraminal stenosis. Findings are similar to the prior study.   IMPRESSION: 1. Multilevel degenerative changes throughout the lumbar spine have overall progressed since 2022. 2. At L2-L3, there is moderate spinal canal stenosis, progressed. 3. At L3-L4, there is severe spinal canal stenosis with right worse than left subarticular zone effacement, and severe right neural foraminal stenosis. The spinal canal stenosis is progressed. 4. At L4-L5, there is mild spinal canal stenosis and severe left and mild-to-moderate right neural foraminal stenosis, progressed. 5. At L5-S1, there is a disc bulge with superimposed right subarticular zone inferiorly migrated extrusion which may impinge the traversing S1 nerve root, similar to the prior study.     Electronically Signed   By: Maude Harry M.D.   On: 06/26/2023 09:30  Imaging: No results found.   PMFS History: Patient Active Problem List   Diagnosis Date Noted   Pain in right shoulder 01/12/2024   Impingement syndrome of right shoulder 08/18/2023   Carpal tunnel syndrome, right upper limb 02/25/2023   Spinal stenosis of lumbar region 02/25/2023   Carpal tunnel syndrome, left upper limb 05/03/2022   S/P cervical spinal fusion 08/14/2021    Other spondylosis with radiculopathy, cervical region 07/02/2021   Foraminal stenosis of cervical region 07/02/2021   GERD (gastroesophageal reflux disease) 06/28/2020   Hyperlipidemia 06/28/2020   Hypertension 06/28/2020   Bilateral hand pain 06/28/2020   Rheumatoid factor positive 06/28/2020   Anxiety 12/09/2018   Past Medical History:  Diagnosis Date   Anxiety    Asthma    followed by pcp   (04-22-2023  per pt symbicort inhaler prn,  last used approx few months ago)   BPH associated with nocturia    urologist--- dr nieves   Chronic insomnia    Claustrophobia    Depression    ED (erectile dysfunction)    Family history of prostate cancer    GERD (gastroesophageal reflux disease)    Hiatal hernia    History of epididymitis    History of gout    04-22-2023  per pt last episode approx 2019 great toe   Hyperlipidemia    Hypertension    evaluated by cardiology for chest pain -- dr jeffrie-- note in epic 05-17-2020,  unable to to [facility] due to claustrophobia; 07-18-2020 ETT done personally reviewed by Dr jeffrie overall reassuring, continue HTN control, no further cardiac work-up at this time   Lateral epicondylitis of right elbow    Lumbar radiculopathy    Lumbar spinal stenosis  Marijuana use, continuous    04-22-2023  per pt smokes 1-2 daily   Seasonal allergic rhinitis due to pollen    Wears glasses     Family History  Problem Relation Age of Onset   Healthy Son    Healthy Son    Lupus Niece     Past Surgical History:  Procedure Laterality Date   ANTERIOR CERVICAL DECOMP/DISCECTOMY FUSION N/A 08/06/2021   Procedure: ANTERIOR CERVICAL DISCECTOMY FUSION C4-5, C5-6, C6-7, ALLOGRAFT, PLATE;  Surgeon: Barbarann Oneil BROCKS, MD;  Location: [facility];  Service: Orthopedics;  Laterality: N/A;   CARPAL TUNNEL RELEASE Left 07/01/2022   Procedure: LEFT CARPAL TUNNEL RELEASE;  Surgeon: Barbarann Oneil BROCKS, MD;  Location: [facility];  Service: Orthopedics;  Laterality: Left;    CARPAL TUNNEL RELEASE Right 03/17/2023   Procedure: RIGHT CARPAL TUNNEL RELEASE;  Surgeon: Barbarann Oneil BROCKS, MD;  Location: [facility];  Service: Orthopedics;  Laterality: Right;   COLONOSCOPY  10/20/2017   PROSTATE SURGERY  04/2023   blockage in prostate   RADIOLOGY WITH ANESTHESIA N/A 10/17/2020   Procedure: MRI WITH ANESTHESIA  LUMBAR WITHOUT CONTRAST AND CERVICAL WITHOUT CONTRAST;  Surgeon: Radiologist, Medication, MD;  Location: [facility];  Service: Radiology;  Laterality: N/A;   RADIOLOGY WITH ANESTHESIA N/A 06/26/2023   Procedure: MRI WITH ANESTHESIA OF LUMBAR SPINE WITHOUT CONTRAST;  Surgeon: Radiologist, Medication, MD;  Location: [facility];  Service: Radiology;  Laterality: N/A;   THULIUM LASER TURP (TRANSURETHRAL RESECTION OF PROSTATE) N/A 04/25/2023   Procedure: THULIUM LASER TURP (TRANSURETHRAL RESECTION OF PROSTATE);  Surgeon: Nieves Cough, MD;  Location: [facility];  Service: Urology;  Laterality: N/A;   UPPER GI ENDOSCOPY  2020   Social History   Occupational History   Not on file  Tobacco Use   Smoking status: Former    Current packs/day: 0.00    Types: Cigarettes    Quit date: 05/16/2012    Years since quitting: 12.0   Smokeless tobacco: Never   Tobacco comments:    04-22-2023  per pt smoked cigarettes 2013, smoked for 38 yrs  (started approx age 30)  Vaping Use   Vaping status: Never Used  Substance and Sexual Activity   Alcohol use: Not Currently   Drug use: Yes    Types: Marijuana    Comment: Daily, last use 04/23/23 unitl after surgery   Sexual activity: Yes    Comment: VASECTOMY

## 2024-06-28 ENCOUNTER — Encounter: Payer: Self-pay | Admitting: Radiology
